# Patient Record
Sex: Female | Born: 1955 | Race: Black or African American | Hispanic: No | Marital: Single | Smoking: Former smoker
Health system: Southern US, Community
[De-identification: ages and names within clinical notes are randomized; demographics above are authoritative.]

## PROBLEM LIST (undated history)

## (undated) DIAGNOSIS — D649 Anemia, unspecified: Secondary | ICD-10-CM

## (undated) DIAGNOSIS — Z720 Tobacco use: Secondary | ICD-10-CM

## (undated) DIAGNOSIS — Z780 Asymptomatic menopausal state: Secondary | ICD-10-CM

## (undated) DIAGNOSIS — K429 Umbilical hernia without obstruction or gangrene: Secondary | ICD-10-CM

## (undated) DIAGNOSIS — R8290 Unspecified abnormal findings in urine: Secondary | ICD-10-CM

## (undated) DIAGNOSIS — M109 Gout, unspecified: Secondary | ICD-10-CM

## (undated) DIAGNOSIS — I10 Essential (primary) hypertension: Secondary | ICD-10-CM

## (undated) DIAGNOSIS — N750 Cyst of Bartholin's gland: Secondary | ICD-10-CM

## (undated) DIAGNOSIS — K061 Gingival enlargement: Secondary | ICD-10-CM

## (undated) DIAGNOSIS — D179 Benign lipomatous neoplasm, unspecified: Secondary | ICD-10-CM

## (undated) DIAGNOSIS — F419 Anxiety disorder, unspecified: Secondary | ICD-10-CM

## (undated) DIAGNOSIS — L03031 Cellulitis of right toe: Secondary | ICD-10-CM

## (undated) HISTORY — DX: Essential (primary) hypertension: I10

## (undated) HISTORY — DX: Asymptomatic menopausal state: Z78.0

## (undated) HISTORY — DX: Tobacco use: Z72.0

## (undated) HISTORY — DX: Anxiety disorder, unspecified: F41.9

## (undated) HISTORY — PX: APPENDECTOMY: SHX54

## (undated) HISTORY — DX: Gingival enlargement: K06.1

## (undated) HISTORY — DX: Cyst of Bartholin's gland: N75.0

## (undated) HISTORY — DX: Gout, unspecified: M10.9

## (undated) HISTORY — DX: Anemia, unspecified: D64.9

## (undated) HISTORY — PX: UMBILICAL HERNIA REPAIR: SHX196

## (undated) HISTORY — DX: Umbilical hernia without obstruction or gangrene: K42.9

---
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
## 1996-03-27 HISTORY — PX: TOTAL ABDOMINAL HYSTERECTOMY: SHX209

## 1998-01-23 ENCOUNTER — Other Ambulatory Visit: Admission: RE | Admit: 1998-01-23 | Discharge: 1998-01-23 | Payer: Self-pay | Admitting: Unknown Physician Specialty

## 1998-02-18 ENCOUNTER — Encounter (HOSPITAL_COMMUNITY): Admission: RE | Admit: 1998-02-18 | Discharge: 1998-02-19 | Payer: Self-pay | Admitting: Oncology

## 1998-02-25 ENCOUNTER — Encounter (HOSPITAL_COMMUNITY): Admission: RE | Admit: 1998-02-25 | Discharge: 1998-05-26 | Payer: Self-pay | Admitting: Oncology

## 1998-04-22 ENCOUNTER — Inpatient Hospital Stay (HOSPITAL_COMMUNITY): Admission: RE | Admit: 1998-04-22 | Discharge: 1998-04-26 | Payer: Self-pay | Admitting: Obstetrics & Gynecology

## 2002-01-05 ENCOUNTER — Encounter: Admission: RE | Admit: 2002-01-05 | Discharge: 2002-01-05 | Payer: Self-pay | Admitting: Obstetrics and Gynecology

## 2002-03-21 ENCOUNTER — Encounter: Admission: RE | Admit: 2002-03-21 | Discharge: 2002-03-21 | Payer: Self-pay | Admitting: *Deleted

## 2002-04-27 ENCOUNTER — Encounter: Admission: RE | Admit: 2002-04-27 | Discharge: 2002-04-27 | Payer: Self-pay | Admitting: Obstetrics and Gynecology

## 2002-04-27 ENCOUNTER — Encounter: Admission: RE | Admit: 2002-04-27 | Discharge: 2002-04-27 | Payer: Self-pay | Admitting: Internal Medicine

## 2002-05-04 ENCOUNTER — Encounter: Admission: RE | Admit: 2002-05-04 | Discharge: 2002-05-04 | Payer: Self-pay | Admitting: Internal Medicine

## 2002-06-06 ENCOUNTER — Encounter: Admission: RE | Admit: 2002-06-06 | Discharge: 2002-06-06 | Payer: Self-pay | Admitting: Internal Medicine

## 2002-08-17 ENCOUNTER — Encounter: Admission: RE | Admit: 2002-08-17 | Discharge: 2002-08-17 | Payer: Self-pay | Admitting: Internal Medicine

## 2002-10-02 ENCOUNTER — Encounter: Admission: RE | Admit: 2002-10-02 | Discharge: 2002-10-02 | Payer: Self-pay | Admitting: Internal Medicine

## 2002-12-08 ENCOUNTER — Encounter: Admission: RE | Admit: 2002-12-08 | Discharge: 2002-12-08 | Payer: Self-pay | Admitting: Internal Medicine

## 2003-02-12 ENCOUNTER — Encounter: Admission: RE | Admit: 2003-02-12 | Discharge: 2003-02-12 | Payer: Self-pay | Admitting: Internal Medicine

## 2003-03-16 ENCOUNTER — Encounter: Admission: RE | Admit: 2003-03-16 | Discharge: 2003-03-16 | Payer: Self-pay | Admitting: Internal Medicine

## 2003-06-27 DIAGNOSIS — N750 Cyst of Bartholin's gland: Secondary | ICD-10-CM

## 2003-06-27 HISTORY — DX: Cyst of Bartholin's gland: N75.0

## 2003-07-06 ENCOUNTER — Encounter: Admission: RE | Admit: 2003-07-06 | Discharge: 2003-07-06 | Payer: Self-pay | Admitting: Internal Medicine

## 2003-07-25 ENCOUNTER — Encounter: Admission: RE | Admit: 2003-07-25 | Discharge: 2003-07-25 | Payer: Self-pay | Admitting: Internal Medicine

## 2003-08-16 ENCOUNTER — Encounter: Admission: RE | Admit: 2003-08-16 | Discharge: 2003-08-16 | Payer: Self-pay | Admitting: Obstetrics and Gynecology

## 2003-11-14 ENCOUNTER — Encounter: Admission: RE | Admit: 2003-11-14 | Discharge: 2003-11-14 | Payer: Self-pay | Admitting: Internal Medicine

## 2003-11-29 ENCOUNTER — Encounter: Admission: RE | Admit: 2003-11-29 | Discharge: 2003-11-29 | Payer: Self-pay | Admitting: Internal Medicine

## 2003-12-06 ENCOUNTER — Encounter: Admission: RE | Admit: 2003-12-06 | Discharge: 2003-12-06 | Payer: Self-pay | Admitting: Internal Medicine

## 2004-11-06 ENCOUNTER — Emergency Department (HOSPITAL_COMMUNITY): Admission: EM | Admit: 2004-11-06 | Discharge: 2004-11-06 | Payer: Self-pay | Admitting: Family Medicine

## 2004-11-12 ENCOUNTER — Ambulatory Visit: Payer: Self-pay | Admitting: Internal Medicine

## 2005-02-26 ENCOUNTER — Ambulatory Visit: Payer: Self-pay | Admitting: Internal Medicine

## 2005-03-05 ENCOUNTER — Ambulatory Visit (HOSPITAL_COMMUNITY): Admission: RE | Admit: 2005-03-05 | Discharge: 2005-03-05 | Payer: Self-pay | Admitting: Internal Medicine

## 2005-03-27 ENCOUNTER — Ambulatory Visit (HOSPITAL_COMMUNITY): Admission: RE | Admit: 2005-03-27 | Discharge: 2005-03-27 | Payer: Self-pay | Admitting: Internal Medicine

## 2005-10-08 ENCOUNTER — Ambulatory Visit: Payer: Self-pay | Admitting: Internal Medicine

## 2005-12-19 ENCOUNTER — Emergency Department (HOSPITAL_COMMUNITY): Admission: EM | Admit: 2005-12-19 | Discharge: 2005-12-19 | Payer: Self-pay | Admitting: Family Medicine

## 2006-01-11 ENCOUNTER — Ambulatory Visit: Payer: Self-pay | Admitting: Internal Medicine

## 2006-01-13 ENCOUNTER — Ambulatory Visit (HOSPITAL_COMMUNITY): Admission: RE | Admit: 2006-01-13 | Discharge: 2006-01-13 | Payer: Self-pay | Admitting: Internal Medicine

## 2006-05-31 ENCOUNTER — Ambulatory Visit: Payer: Self-pay | Admitting: Internal Medicine

## 2006-06-14 DIAGNOSIS — I10 Essential (primary) hypertension: Secondary | ICD-10-CM | POA: Insufficient documentation

## 2006-06-14 DIAGNOSIS — D649 Anemia, unspecified: Secondary | ICD-10-CM

## 2006-06-14 DIAGNOSIS — N951 Menopausal and female climacteric states: Secondary | ICD-10-CM

## 2006-06-14 DIAGNOSIS — M25529 Pain in unspecified elbow: Secondary | ICD-10-CM | POA: Insufficient documentation

## 2006-06-14 HISTORY — DX: Anemia, unspecified: D64.9

## 2006-08-19 DIAGNOSIS — D179 Benign lipomatous neoplasm, unspecified: Secondary | ICD-10-CM

## 2006-08-19 DIAGNOSIS — K429 Umbilical hernia without obstruction or gangrene: Secondary | ICD-10-CM | POA: Insufficient documentation

## 2006-08-19 DIAGNOSIS — Z87891 Personal history of nicotine dependence: Secondary | ICD-10-CM | POA: Insufficient documentation

## 2006-08-19 DIAGNOSIS — F411 Generalized anxiety disorder: Secondary | ICD-10-CM | POA: Insufficient documentation

## 2006-08-19 HISTORY — DX: Benign lipomatous neoplasm, unspecified: D17.9

## 2006-10-08 ENCOUNTER — Ambulatory Visit: Payer: Self-pay | Admitting: Internal Medicine

## 2006-10-11 ENCOUNTER — Encounter: Payer: Self-pay | Admitting: Licensed Clinical Social Worker

## 2007-02-10 ENCOUNTER — Ambulatory Visit: Payer: Self-pay | Admitting: Internal Medicine

## 2007-02-10 ENCOUNTER — Encounter (INDEPENDENT_AMBULATORY_CARE_PROVIDER_SITE_OTHER): Payer: Self-pay | Admitting: Internal Medicine

## 2007-02-10 LAB — CONVERTED CEMR LAB
Albumin: 4.6 g/dL (ref 3.5–5.2)
CO2: 22 meq/L (ref 19–32)
Glucose, Bld: 88 mg/dL (ref 70–99)
Potassium: 3.8 meq/L (ref 3.5–5.3)
Sodium: 139 meq/L (ref 135–145)
Total Protein: 8 g/dL (ref 6.0–8.3)

## 2007-03-03 ENCOUNTER — Ambulatory Visit (HOSPITAL_COMMUNITY): Admission: RE | Admit: 2007-03-03 | Discharge: 2007-03-03 | Payer: Self-pay | Admitting: Internal Medicine

## 2007-03-03 ENCOUNTER — Ambulatory Visit: Payer: Self-pay | Admitting: Internal Medicine

## 2007-03-17 ENCOUNTER — Encounter (INDEPENDENT_AMBULATORY_CARE_PROVIDER_SITE_OTHER): Payer: Self-pay | Admitting: Internal Medicine

## 2007-03-17 ENCOUNTER — Ambulatory Visit: Payer: Self-pay | Admitting: *Deleted

## 2007-03-23 LAB — CONVERTED CEMR LAB
Rhuematoid fact SerPl-aCnc: <20 intl units/mL (ref 0–20)
Sed Rate: 41 mm/hr — ABNORMAL HIGH (ref 0–22)

## 2007-04-18 ENCOUNTER — Ambulatory Visit (HOSPITAL_COMMUNITY): Admission: RE | Admit: 2007-04-18 | Discharge: 2007-04-18 | Payer: Self-pay | Admitting: *Deleted

## 2007-09-30 ENCOUNTER — Telehealth: Payer: Self-pay | Admitting: *Deleted

## 2007-10-12 ENCOUNTER — Ambulatory Visit: Payer: Self-pay | Admitting: Internal Medicine

## 2007-12-13 ENCOUNTER — Encounter (INDEPENDENT_AMBULATORY_CARE_PROVIDER_SITE_OTHER): Payer: Self-pay | Admitting: Internal Medicine

## 2007-12-22 ENCOUNTER — Ambulatory Visit: Payer: Self-pay | Admitting: Internal Medicine

## 2007-12-22 ENCOUNTER — Encounter (INDEPENDENT_AMBULATORY_CARE_PROVIDER_SITE_OTHER): Payer: Self-pay | Admitting: Internal Medicine

## 2007-12-23 LAB — CONVERTED CEMR LAB
BUN: 16 mg/dL (ref 6–23)
CO2: 20 meq/L (ref 19–32)
Calcium: 9.3 mg/dL (ref 8.4–10.5)
Chloride: 108 meq/L (ref 96–112)
Creatinine, Ser: 0.63 mg/dL (ref 0.40–1.20)
Glucose, Bld: 92 mg/dL (ref 70–99)

## 2008-05-22 ENCOUNTER — Ambulatory Visit (HOSPITAL_COMMUNITY): Admission: RE | Admit: 2008-05-22 | Discharge: 2008-05-22 | Payer: Self-pay | Admitting: Internal Medicine

## 2008-08-03 ENCOUNTER — Ambulatory Visit: Payer: Self-pay | Admitting: Internal Medicine

## 2008-12-10 ENCOUNTER — Telehealth (INDEPENDENT_AMBULATORY_CARE_PROVIDER_SITE_OTHER): Payer: Self-pay | Admitting: Internal Medicine

## 2009-04-23 ENCOUNTER — Ambulatory Visit: Payer: Self-pay | Admitting: Internal Medicine

## 2009-04-24 ENCOUNTER — Encounter (INDEPENDENT_AMBULATORY_CARE_PROVIDER_SITE_OTHER): Payer: Self-pay | Admitting: Internal Medicine

## 2009-04-24 LAB — CONVERTED CEMR LAB
BUN: 16 mg/dL (ref 6–23)
CO2: 20 meq/L (ref 19–32)
Chloride: 106 meq/L (ref 96–112)
Creatinine, Ser: 0.66 mg/dL (ref 0.40–1.20)
HCT: 34.7 % — ABNORMAL LOW (ref 36.0–46.0)
Hemoglobin: 10.8 g/dL — ABNORMAL LOW (ref 12.0–15.0)
Platelets: 173 10*3/uL (ref 150–400)
WBC: 6.6 10*3/uL (ref 4.0–10.5)

## 2009-04-30 ENCOUNTER — Encounter: Payer: Self-pay | Admitting: Gastroenterology

## 2009-05-07 ENCOUNTER — Ambulatory Visit: Payer: Self-pay | Admitting: Internal Medicine

## 2009-05-08 ENCOUNTER — Encounter (INDEPENDENT_AMBULATORY_CARE_PROVIDER_SITE_OTHER): Payer: Self-pay | Admitting: Internal Medicine

## 2009-05-09 LAB — CONVERTED CEMR LAB
Cholesterol: 219 mg/dL — ABNORMAL HIGH (ref 0–200)
RBC Folate: 609 ng/mL — ABNORMAL HIGH (ref 180–600)
Triglycerides: 385 mg/dL — ABNORMAL HIGH (ref <150)
UIBC: 274 ug/dL
VLDL: 77 mg/dL — ABNORMAL HIGH (ref 0–40)

## 2009-05-14 ENCOUNTER — Ambulatory Visit: Payer: Self-pay | Admitting: Gastroenterology

## 2009-05-14 ENCOUNTER — Emergency Department (HOSPITAL_COMMUNITY): Admission: EM | Admit: 2009-05-14 | Discharge: 2009-05-14 | Payer: Self-pay | Admitting: Emergency Medicine

## 2009-05-23 ENCOUNTER — Ambulatory Visit (HOSPITAL_COMMUNITY): Admission: RE | Admit: 2009-05-23 | Discharge: 2009-05-23 | Payer: Self-pay | Admitting: Internal Medicine

## 2009-05-28 ENCOUNTER — Ambulatory Visit: Payer: Self-pay | Admitting: Gastroenterology

## 2009-06-13 ENCOUNTER — Emergency Department (HOSPITAL_COMMUNITY): Admission: EM | Admit: 2009-06-13 | Discharge: 2009-06-13 | Payer: Self-pay | Admitting: Emergency Medicine

## 2009-07-25 ENCOUNTER — Ambulatory Visit: Payer: Self-pay | Admitting: Internal Medicine

## 2009-07-26 ENCOUNTER — Encounter (INDEPENDENT_AMBULATORY_CARE_PROVIDER_SITE_OTHER): Payer: Self-pay | Admitting: Internal Medicine

## 2009-07-26 LAB — CONVERTED CEMR LAB
CO2: 21 meq/L (ref 19–32)
Chloride: 104 meq/L (ref 96–112)
Creatinine, Ser: 0.67 mg/dL (ref 0.40–1.20)
Potassium: 4.1 meq/L (ref 3.5–5.3)
Sodium: 140 meq/L (ref 135–145)

## 2009-08-29 ENCOUNTER — Telehealth (INDEPENDENT_AMBULATORY_CARE_PROVIDER_SITE_OTHER): Payer: Self-pay | Admitting: Internal Medicine

## 2009-10-02 ENCOUNTER — Telehealth (INDEPENDENT_AMBULATORY_CARE_PROVIDER_SITE_OTHER): Payer: Self-pay | Admitting: Internal Medicine

## 2009-12-06 ENCOUNTER — Ambulatory Visit: Payer: Self-pay | Admitting: Infectious Disease

## 2009-12-08 LAB — CONVERTED CEMR LAB
Alkaline Phosphatase: 53 units/L (ref 39–117)
BUN: 14 mg/dL (ref 6–23)
Creatinine, Ser: 0.72 mg/dL (ref 0.40–1.20)
Glucose, Bld: 83 mg/dL (ref 70–99)
HCT: 34.1 % — ABNORMAL LOW (ref 36.0–46.0)
Hemoglobin: 11 g/dL — ABNORMAL LOW (ref 12.0–15.0)
MCHC: 32.3 g/dL (ref 30.0–36.0)
MCV: 87 fL (ref >=78.0)
RBC: 3.92 M/uL (ref 3.87–5.11)
Total Bilirubin: 0.3 mg/dL (ref 0.3–1.2)

## 2010-02-13 ENCOUNTER — Telehealth (INDEPENDENT_AMBULATORY_CARE_PROVIDER_SITE_OTHER): Payer: Self-pay | Admitting: *Deleted

## 2010-04-11 ENCOUNTER — Ambulatory Visit: Payer: Self-pay | Admitting: Internal Medicine

## 2010-04-11 DIAGNOSIS — J069 Acute upper respiratory infection, unspecified: Secondary | ICD-10-CM | POA: Insufficient documentation

## 2010-04-12 ENCOUNTER — Encounter: Payer: Self-pay | Admitting: Internal Medicine

## 2010-04-15 LAB — CONVERTED CEMR LAB
BUN: 14 mg/dL (ref 6–23)
Basophils Relative: 0 % (ref 0–1)
Chloride: 109 meq/L (ref 96–112)
Ferritin: 197 ng/mL (ref 10–291)
Glucose, Bld: 91 mg/dL (ref 70–99)
Iron: 53 ug/dL (ref 42–145)
Lymphs Abs: 2.6 10*3/uL (ref 0.7–4.0)
Monocytes Relative: 7 % (ref 3–12)
Neutro Abs: 3.4 10*3/uL (ref 1.7–7.7)
Neutrophils Relative %: 51 % (ref 43–77)
Platelets: 197 10*3/uL (ref 150–400)
Potassium: 3.6 meq/L (ref 3.5–5.3)
RBC: 3.73 M/uL — ABNORMAL LOW (ref 3.87–5.11)
Sodium: 142 meq/L (ref 135–145)
TIBC: 303 ug/dL (ref 250–470)
UIBC: 250 ug/dL
WBC: 6.6 10*3/uL (ref 4.0–10.5)

## 2010-06-17 ENCOUNTER — Ambulatory Visit (HOSPITAL_COMMUNITY)
Admission: RE | Admit: 2010-06-17 | Discharge: 2010-06-17 | Payer: Self-pay | Source: Home / Self Care | Attending: Internal Medicine | Admitting: Internal Medicine

## 2010-08-18 ENCOUNTER — Ambulatory Visit: Admission: RE | Admit: 2010-08-18 | Discharge: 2010-08-18 | Payer: Self-pay | Source: Home / Self Care

## 2010-08-26 NOTE — Assessment & Plan Note (Signed)
Summary: CHECKUP/SB.   Vital Signs:  Patient profile:   55 year old female Height:      66 inches Weight:      188.8 pounds BMI:     30.58 Temp:     99.2 degrees F oral Pulse rate:   68 / minute BP sitting:   132 / 76  (right arm)  Vitals Entered By: Filomena Jungling NT II (Dec 06, 2009 3:37 PM) CC: need refill on meds. Is Patient Diabetic? No Pain Assessment Patient in pain? no      Nutritional Status BMI of > 30 = obese  Does patient need assistance? Functional Status Self care Ambulation Normal   Primary Care Provider:  Carlus Pavlov MD  CC:  need refill on meds..  History of Present Illness: Jenna May is a 55 yo lady with PMH as outlined in the EMR comes today for a f/u visit.   1. HTN: She is taking her meds regularly without any problem.   2. Anxiety: Her nerves is OK.   3. Anemia: She is not on any iron and is s/p hysterectomy for fibroids.   4. Leg pain: She is not taking allopurinol regularly and she has no pain.   Preventive Screening-Counseling & Management  Alcohol-Tobacco     Alcohol drinks/day: 3     Alcohol type: bud light     Smoking Status: quit     Year Quit: cant remember  Current Medications (verified): 1)  Enalapril Maleate 20 Mg Tabs (Enalapril Maleate) .... Take 1 Tablet By Mouth Once A Day 2)  Norvasc 5 Mg Tabs (Amlodipine Besylate) .... Take 1 Pill By Mouth Daily. 3)  Flexeril 5 Mg Tabs (Cyclobenzaprine Hcl) .... Take 1 Pill By Mouth Three Times A Day.  Allergies: No Known Drug Allergies  Review of Systems      See HPI  Physical Exam  Mouth:  pharynx pink and moist.   Lungs:  normal breath sounds, no crackles, and no wheezes.   Heart:  normal rate, regular rhythm, and no murmur.   Abdomen:  soft, non-tender, normal bowel sounds, no distention, no masses, and no guarding.   Extremities:  trace left pedal edema and trace right pedal edema.   Neurologic:  alert & oriented X3.   Cervical Nodes:  no anterior cervical  adenopathy and no posterior cervical adenopathy.     Impression & Recommendations:  Problem # 1:  ANXIETY (ICD-300.00) Stable without treatment, follow.   Problem # 2:  HYPERTENSION (ICD-401.9) BP great, will cont same.  Her updated medication list for this problem includes:    Enalapril Maleate 20 Mg Tabs (Enalapril maleate) .Marland Kitchen... Take 1 tablet by mouth once a day    Norvasc 5 Mg Tabs (Amlodipine besylate) .Marland Kitchen... Take 1 pill by mouth daily.  Orders: T-Comprehensive Metabolic Panel [id number]) T-CBC No Diff ([id number])  BP today: 132/76 Prior BP: 131/80 (07/25/2009)  Labs Reviewed: K+: 4.1 (07/26/2009) Creat: : 0.67 (07/26/2009)   Chol: 219 (05/08/2009)   HDL: 39 (05/08/2009)   LDL: 103 (05/08/2009)   TG: 385 (05/08/2009)  Problem # 3:  ANEMIA, NORMOCYTIC (ICD-285.9) Will check CBC, she is s/p hysterectomy.  Orders: T-CBC No Diff ([id number])  Problem # 4:  FOOT PAIN, LEFT (ICD-729.5) She is not taking allopurinol regularly and has no painful or swelling of her foot. Plan is to stop allopurinol and follow up closely.   Complete Medication List: 1)  Enalapril Maleate 20 Mg Tabs (Enalapril maleate) .... Take 1 tablet by  mouth once a day 2)  Norvasc 5 Mg Tabs (Amlodipine besylate) .... Take 1 pill by mouth daily. 3)  Flexeril 5 Mg Tabs (Cyclobenzaprine hcl) .... Take 1 pill by mouth three times a day.  Patient Instructions: 1)  Please schedule a follow-up appointment in 3 months. 2)  Limit your Sodium (Salt) to less than 2 grams a day(slightly less than 1/2 a teaspoon) to prevent fluid retention, swelling, or worsening of symptoms. 3)  It is important that you exercise regularly at least 20 minutes 5 times a week. If you develop chest pain, have severe difficulty breathing, or feel very tired , stop exercising immediately and seek medical attention. 4)  You need to lose weight. Consider a lower calorie diet and regular exercise.  5)  Check your Blood Pressure regularly. If  it is above: you should make an appointment. Prescriptions: ALLOPURINOL 100 MG TABS (ALLOPURINOL) take 1 pill by mouth daily.  #30 x 0   Entered and Authorized by:   Jason Coop MD   Signed by:   Jason Coop MD on 12/06/2009   Method used:   Faxed to ...       [facility] Department (retail)       [address] [place], [place]  [address]       Ph: [phone]       Fax: [phone]   RxID:   [id number]  Process Orders Check Orders Results:     [facility]: ABN not required for this insurance Tests Sent for requisitioning (Dec 06, 2009 4:00 PM):     12/06/2009: [facility] -- T-Comprehensive Metabolic Panel [[id number]] (signed)     12/06/2009: [facility] -- T-CBC No Diff [[id number]] (signed)    Process Orders Check Orders Results:     [facility]: ABN not required for this insurance Tests Sent for requisitioning (Dec 06, 2009 4:00 PM):     12/06/2009: [facility] -- T-Comprehensive Metabolic Panel [[id number]] (signed)     12/06/2009: [facility] -- T-CBC No Diff [[id number]] (signed)

## 2010-08-26 NOTE — Progress Notes (Signed)
Summary: refill/gg  Phone Note Refill Request  on August 29, 2009 12:45 PM  Refills Requested: Medication #1:  ALLOPURINOL 100 MG TABS take 1 pill by mouth daily..  Method Requested: Telephone to Pharmacy Initial call taken by: Merrie Roof RN,  August 29, 2009 12:45 PM  Follow-up for Phone Call       Follow-up by: Jason Coop MD,  August 29, 2009 3:44 PM    Prescriptions: ALLOPURINOL 100 MG TABS (ALLOPURINOL) take 1 pill by mouth daily.  #30 x 1   Entered and Authorized by:   Jason Coop MD   Signed by:   Jason Coop MD on 08/29/2009   Method used:   Telephoned to ...       [facility] Department (retail)       [address] [place], [place]  [address]       Ph: [phone]       Fax: [phone]   RxID:   [id number]   Appended Document: refill/gg Rx called in to [facility]

## 2010-08-26 NOTE — Progress Notes (Signed)
Summary: refill/ hla  Phone Note Refill Request Message from:  Patient on February 13, 2010 8:43 AM  Refills Requested: Medication #1:  ENALAPRIL MALEATE 20 MG TABS Take 1 tablet by mouth once a day   Dosage confirmed as above?Dosage Confirmed   Last Refilled: 6/17  Medication #2:  NORVASC 5 MG TABS take 1 pill by mouth daily.   Dosage confirmed as above?Dosage Confirmed   Last Refilled: 6/17 last visit 5/13, last labs 5/13  Initial call taken by: Marin Roberts RN,  February 13, 2010 8:45 AM    Prescriptions: NORVASC 5 MG TABS (AMLODIPINE BESYLATE) take 1 pill by mouth daily.  #90 x 3   Entered and Authorized by:   Zoila Shutter MD   Signed by:   Zoila Shutter MD on 02/13/2010   Method used:   Electronically to        [facility] [phone]* (retail)       [address]       [place], [place]  [address]       Ph: [phone]       Fax: [phone]   RxID:   [id number] ENALAPRIL MALEATE 20 MG TABS (ENALAPRIL MALEATE) Take 1 tablet by mouth once a day  #90 x 3   Entered and Authorized by:   Zoila Shutter MD   Signed by:   Zoila Shutter MD on 02/13/2010   Method used:   Electronically to        [facility] [phone]* (retail)       [address]       [place], [place]  [address]       Ph: [phone]       Fax: [phone]   RxID:   [id number]

## 2010-08-26 NOTE — Progress Notes (Signed)
Summary: refill/gg  Phone Note Refill Request  on October 02, 2009 11:12 AM  Refills Requested: Medication #1:  NORVASC 5 MG TABS take 1 pill by mouth daily.  Method Requested: Fax to Local Pharmacy Initial call taken by: Merrie Roof RN,  October 02, 2009 11:12 AM  Follow-up for Phone Call       Follow-up by: Jason Coop MD,  October 02, 2009 11:37 AM    Prescriptions: NORVASC 5 MG TABS (AMLODIPINE BESYLATE) take 1 pill by mouth daily.  #30 x 3   Entered and Authorized by:   Jason Coop MD   Signed by:   Jason Coop MD on 10/02/2009   Method used:   Telephoned to ...       [facility] Department (retail)       [address] [place], [place]  [address]       Ph: [phone]       Fax: [phone]   RxID:   [id number]

## 2010-08-26 NOTE — Assessment & Plan Note (Signed)
Summary: EST-CK/FU/MEDS/CFB   Vital Signs:  Patient profile:   55 year old female Height:      66 inches (167.64 cm) Weight:      191.4 pounds (87 kg) BMI:     31.00 O2 Sat:      100 % on Room air Temp:     98.4 degrees F (36.89 degrees C) oral Pulse rate:   64 / minute BP sitting:   150 / 79  (left arm)  Vitals Entered By: Stanton Kidney Ditzler RN (April 11, 2010 4:04 PM)  O2 Flow:  Room air Is Patient Diabetic? No Pain Assessment Patient in pain? no      Nutritional Status BMI of > 30 = obese Nutritional Status Detail appetite good  Have you ever been in a relationship where you felt threatened, hurt or afraid?denies   Does patient need assistance? Functional Status Self care Ambulation Normal Comments Ck-up and labs. Since 04/06/10  nonproductive cough, runny nose and right side top lip is sensitive.   Primary Care Provider:  Carlus Pavlov MD   History of Present Illness: Patient presents with complaint of runny nose, nasal congestion, and cough since Sunday; the cough is productive of clear mucus in small amounts.  She reports that her symptoms have improved to some extent but are still present.  She reports that she is compliant with her medications.   Depression History:      The patient denies a depressed mood most of the day and a diminished interest in her usual daily activities.         Preventive Screening-Counseling & Management  Alcohol-Tobacco     Alcohol drinks/day: 3     Alcohol type: bud light     Smoking Status: quit     Year Quit: cant remember  Allergies (verified): No Known Drug Allergies  Review of Systems General:  Denies chills, fever, and sweats. ENT:  Denies earache and sore throat. CV:  Denies chest pain or discomfort and swelling of feet. Resp:  Complains of cough; denies shortness of breath. GI:  Denies abdominal pain, bloody stools, dark tarry stools, nausea, and vomiting. GU:  Denies abnormal vaginal bleeding.  Physical  Exam  General:  alert, no distress Ears:  R ear normal and L ear normal.   Nose:  no sinus percussion tenderness.   Mouth:  pharynx pink and moist, no erythema, and no exudates.   Neck:  supple.   Lungs:  normal respiratory effort, normal breath sounds, no crackles, and no wheezes.   Heart:  normal rate, regular rhythm, no murmur, no gallop, and no rub.   Extremities:  no edema   Impression & Recommendations:  Problem # 1:  URI (ICD-465.9) Patient's symptoms are consistent viral URI.  Plan is symptomatic treatment. I advised patient to call or return if symptoms worsen or if they do not resolve.  Her updated medication list for this problem includes:    Chest Congestion Relief Dm 20-400 Mg Tabs (Dextromethorphan-guaifenesin) .Marland Kitchen... Take 1 tablet by mouth every 4 hours as needed  Problem # 2:  HYPERTENSION (ICD-401.9) Systolic BP is mildly elevated today.  Plan is to recheck upon return.  Her updated medication list for this problem includes:    Enalapril Maleate 20 Mg Tabs (Enalapril maleate) .Marland Kitchen... Take 1 tablet by mouth once a day    Norvasc 5 Mg Tabs (Amlodipine besylate) .Marland Kitchen... Take 1 pill by mouth daily.  Orders: T-Basic Metabolic Panel ([id number])  BP today: 150/79 Prior BP: 132/76 (  12/06/2009)  Labs Reviewed: K+: 4.1 (12/06/2009) Creat: : 0.72 (12/06/2009)   Chol: 219 (05/08/2009)   HDL: 39 (05/08/2009)   LDL: 103 (05/08/2009)   TG: 385 (05/08/2009)  Problem # 3:  ANEMIA, NORMOCYTIC (ICD-285.9) Patient has chronic mild anemia.  Plan is to check labs as below.  Orders: T-CBC w/Diff [id number]) T-Ferritin [id number]) T-Iron [id number]) T-Iron Binding Capacity (TIBC) ([id number]) T-Folic Acid; RBC [id number]) T-Vitamin B12 [phone])  Hgb: 11.0 (12/06/2009)   Hct: 34.1 (12/06/2009)   Platelets: 229 (12/06/2009) RBC: 3.92 (12/06/2009)   RDW: 13.9 (12/06/2009)   WBC: 8.6 (12/06/2009) MCV: 87.0 (12/06/2009)   MCHC: 32.3 (12/06/2009) Ferritin: 236  (05/08/2009) Iron: 43 (05/08/2009)   TIBC: 317 (05/08/2009)   % Sat: 14 (05/08/2009) B12: 1086 (05/08/2009)   Folate: 609 (05/08/2009)   TSH: 3.986 (07/26/2009)  Complete Medication List: 1)  Enalapril Maleate 20 Mg Tabs (Enalapril maleate) .... Take 1 tablet by mouth once a day 2)  Norvasc 5 Mg Tabs (Amlodipine besylate) .... Take 1 pill by mouth daily. 3)  Chest Congestion Relief Dm 20-400 Mg Tabs (Dextromethorphan-guaifenesin) .... Take 1 tablet by mouth every 4 hours as needed  Patient Instructions: 1)  Please schedule a follow-up appointment in 1 month. 2)  Continue current medications. 3)  Use Afrin nasal spray every 12 hours as needed for nasal congestion for 3 days. 4)  Call or return if symptoms worsen or do not resolve.  Process Orders Check Orders Results:     [facility]: ABN not required for this insurance Tests Sent for requisitioning (April 11, 2010 6:04 PM):     04/11/2010: [facility] -- T-Basic Metabolic Panel [phone] [id number] (signed)     04/11/2010: [facility] -- T-CBC w/Diff [[id number]] (signed)     04/11/2010: [facility] -- T-Ferritin [phone] (signed)     04/11/2010: [facility] -- Augusto Gamble [[id number]] (signed)     04/11/2010: [facility] -- Augusto Gamble Binding Capacity (TIBC) [[id number]] (signed)     04/11/2010: [facility] -- T-Folic Acid; RBC [[id number]] (signed)     04/11/2010: [facility] -- T-Vitamin B12 [[id number]] (signed)     Prevention & Chronic Care Immunizations   Influenza vaccine: Not documented   Influenza vaccine deferral: Refused  (04/11/2010)    Tetanus booster: Not documented    Pneumococcal vaccine: Not documented  Colorectal Screening   Hemoccult: Not documented    Colonoscopy: DONE  (05/28/2009)   Colonoscopy due: 05/2019  Other Screening   Pap smear: Not documented   Pap smear  action/deferral: Not indicated S/P hysterectomy  (04/11/2010)    Mammogram: ASSESSMENT: Negative - BI-RADS 1^MM DIGITAL SCREENING  (05/23/2009)   Mammogram action/deferral: Ordered  (04/23/2009)   Smoking status: quit  (04/11/2010)  Lipids   Total Cholesterol: 219  (05/08/2009)   LDL: 103  (05/08/2009)   LDL Direct: Not documented   HDL: 39  (05/08/2009)   Triglycerides: 385  (05/08/2009)  Hypertension   Last Blood Pressure: 150 / 79  (04/11/2010)   Serum creatinine: 0.72  (12/06/2009)   Serum potassium 4.1  (12/06/2009)    Hypertension flowsheet reviewed?: Yes   Progress toward BP goal: Deteriorated  Self-Management Support :   Personal Goals (by the next clinic visit) :      Personal blood pressure goal: 140/90  (04/23/2009)   Patient will work on the following items until the next clinic visit to reach self-care goals:     Medications and monitoring: take my medicines every day, bring  all of my medications to every visit  (04/11/2010)     Eating: drink diet soda or water instead of juice or soda, eat more vegetables, use fresh or frozen vegetables, eat baked foods instead of fried foods, eat fruit for snacks and desserts  (04/11/2010)     Activity: take the stairs instead of the elevator, park at the far end of the parking lot  (04/11/2010)    Hypertension self-management support: Written self-care plan, Education handout, Resources for patients handout  (04/11/2010)   Hypertension self-care plan printed.   Hypertension education handout printed      Resource handout printed.

## 2010-08-28 NOTE — Assessment & Plan Note (Signed)
Summary: CHECKUP/SB.   Vital Signs:  Patient profile:   55 year old female Height:      66 inches   Primary Care Provider:  Carlus Pavlov MD   History of Present Illness: Patient left the clinic without beeing seen by any physician.   Allergies: No Known Drug Allergies   Complete Medication List: 1)  Enalapril Maleate 20 Mg Tabs (Enalapril maleate) .... Take 1 tablet by mouth once a day 2)  Norvasc 5 Mg Tabs (Amlodipine besylate) .... Take 1 pill by mouth daily. 3)  Chest Congestion Relief Dm 20-400 Mg Tabs (Dextromethorphan-guaifenesin) .... Take 1 tablet by mouth every 4 hours as needed     Prevention & Chronic Care Immunizations   Influenza vaccine: Not documented   Influenza vaccine deferral: Refused  (04/11/2010)    Tetanus booster: Not documented    Pneumococcal vaccine: Not documented  Colorectal Screening   Hemoccult: Not documented    Colonoscopy: DONE  (05/28/2009)   Colonoscopy due: 05/2019  Other Screening   Pap smear: Not documented   Pap smear action/deferral: Not indicated S/P hysterectomy  (04/11/2010)    Mammogram: ASSESSMENT: Negative - BI-RADS 1^MM DIGITAL SCREENING  (05/23/2009)   Mammogram action/deferral: Ordered  (04/23/2009)   Smoking status: quit  (04/11/2010)  Lipids   Total Cholesterol: 219  (05/08/2009)   LDL: 103  (05/08/2009)   LDL Direct: Not documented   HDL: 39  (05/08/2009)   Triglycerides: 385  (05/08/2009)  Hypertension   Last Blood Pressure: 150 / 79  (04/11/2010)   Serum creatinine: 0.71  (04/12/2010)   Serum potassium 3.6  (04/12/2010)  Self-Management Support :   Personal Goals (by the next clinic visit) :      Personal blood pressure goal: 140/90  (04/23/2009)   Hypertension self-management support: Written self-care plan, Education handout, Resources for patients handout  (04/11/2010)

## 2010-09-29 ENCOUNTER — Ambulatory Visit (INDEPENDENT_AMBULATORY_CARE_PROVIDER_SITE_OTHER): Payer: Self-pay | Admitting: Internal Medicine

## 2010-09-29 ENCOUNTER — Encounter: Payer: Self-pay | Admitting: Internal Medicine

## 2010-09-29 ENCOUNTER — Ambulatory Visit: Payer: Self-pay | Admitting: Internal Medicine

## 2010-09-29 VITALS — BP 151/83 | HR 77 | Temp 98.1°F | Ht 66.0 in | Wt 190.0 lb

## 2010-09-29 DIAGNOSIS — I10 Essential (primary) hypertension: Secondary | ICD-10-CM

## 2010-09-29 DIAGNOSIS — Z87891 Personal history of nicotine dependence: Secondary | ICD-10-CM

## 2010-09-29 DIAGNOSIS — M25569 Pain in unspecified knee: Secondary | ICD-10-CM

## 2010-09-29 DIAGNOSIS — D179 Benign lipomatous neoplasm, unspecified: Secondary | ICD-10-CM

## 2010-09-29 DIAGNOSIS — N951 Menopausal and female climacteric states: Secondary | ICD-10-CM

## 2010-09-29 DIAGNOSIS — D649 Anemia, unspecified: Secondary | ICD-10-CM

## 2010-09-29 DIAGNOSIS — F411 Generalized anxiety disorder: Secondary | ICD-10-CM

## 2010-09-29 DIAGNOSIS — M25562 Pain in left knee: Secondary | ICD-10-CM | POA: Insufficient documentation

## 2010-09-29 LAB — RETICULOCYTES
ABS Retic: 57.6 10*3/uL (ref 19.0–186.0)
Retic Ct Pct: 1.5 % (ref 0.4–3.1)

## 2010-09-29 MED ORDER — HYDROCHLOROTHIAZIDE 12.5 MG PO TABS: 12.5000 mg | ORAL_TABLET | Freq: Every day | ORAL | Status: DC

## 2010-09-29 NOTE — Patient Instructions (Signed)
1) Please follow-up at the clinic in 3-4 weeks, at which time we will reevaluate ur blood pressure, pain, check labs. 2) STOP taking your Amlodipine 3) You have been started on Hydrochlorothiazide for your blood pressure, if you develop rash, throat closing, tongue swelling, please stop the medication and call the clinic at [phone]. 4) Please come fasting (no food/ drink for 8 hours) prior to your next visit so we can check your cholesterol panel, and kidney function.  5) You can elevate your leg, use ice or heat pad over your knee for swelling 6) Please bring all of your medications in a bag to your next visit.

## 2010-09-29 NOTE — Assessment & Plan Note (Signed)
Likely inflammatory process such as arthritis. No ligamentous laxity, no limitation in mobility at this time.  -  Continue over-the-counter naproxen PRN pain -  No indication for imaging at this time.  -  Pt instructed to return to clinic if pain becomes chronic, present significant limitation in mobility, or if swelling worsens.

## 2010-09-29 NOTE — Assessment & Plan Note (Addendum)
Unclear etiology. Anemia panel in 03/2010 showing Iron 53 , TIBC 303 , iron saturation percentage 17, ferritin 197 , B12 and folate within normal limits.  Patient has had a negative colonoscopy and mammogram within the past 2 years.  No identifiable medication effect to account for her anemia. No history of renal or splenic disease.  Normal TSH in 2011. No history of alcohol abuse.  Patient at this time asymptomatic without weakness lightheadedness fatigue dizziness. -  Will check CBC to assess stability of hemoglobin. -  Will check reticulocyte count.

## 2010-09-29 NOTE — Progress Notes (Signed)
  Subjective:    Patient ID: Jenna May, female    DOB: 1955-07-30, 55 y.o.   MRN: [id number]  HPI  Patient is a 55 year old female with a past medical history of hypertension normocytic anemia remote history of tobacco abuse who presents to clinic today for the following :  1) Left knee pain - 3 days, feels swelling, warmth, and throbbing pain all over knee. Takes aleve 2 tabs daily, which seems to resolve pain. 5/10 pain at max, helps with swelling as well. No hx of trauma or falls. No recent increase in activity.   2) Follow-up of labs - anemia panel showing chronic normocytic anemia, without evidence of iron-deficiency, folate, or B12 deficiency. No weakness, lightheadness, dizziness. Patient has had normal colonoscopy,   3) HTN - Patient  does not check blood pressure regularly at home, with average BP readings of 130/80 mmHg when she occasionally going to [employer]. Currently taking Enalapril and Norvasc (only intermittently taking Norvasc because was not able to get it as generic at [facility]). Denies headaches, dizziness, lightheadedness, chest pain, shortness of breath.     Review of Systems Per HPI    Objective:   Physical Exam General: Vital signs reviewed and noted. Well-developed,well-nourished,in no acute distress; alert,appropriate and cooperative throughout examination. Head: normocephalic, atraumatic. Neck: No deformities, masses, or tenderness noted. Lungs: Normal respiratory effort. Clear to auscultation BL without crackles or wheezes.  Heart: RRR. S1 and S2 normal without gallop, murmur, or rubs.  Abdomen: BS normoactive. Soft, Nondistended, non-tender.  No masses or organomegaly. Extremities: No pretibial edema. FROM of BL LE. Left knee with mild warmth, very mild swelling, no appreciable redness, effusion. No evidence of ligamentous laxity. Sensation equal bilaterally. Pulse equal and strong bilaterally.         Assessment & Plan:

## 2010-09-29 NOTE — Assessment & Plan Note (Addendum)
Blood pressures mildly elevated. However, patient  not regularly taking her amlodipine secondary to financial constraints, as it is not covered on the [employer] $4 formulary.  Patient was previously discontinued of her hydrochlorothiazide when it was thought that she may have gout , however she was previously well controlled on this medication and joint aspiration was not indicative of gout. -  Discontinue amlodipine.  -  Start hydrochlorothiazide 12.5 mg once daily.  -  Check basic metabolic panel and fasting lipid panel in 3-4 weeks.

## 2010-09-30 LAB — CBC
Hemoglobin: 10.8 g/dL — ABNORMAL LOW (ref 12.0–15.0)
MCH: 28.4 pg (ref 26.0–34.0)
Platelets: 198 10*3/uL (ref 150–400)
RBC: 3.8 MIL/uL — ABNORMAL LOW (ref 3.87–5.11)
WBC: 6.5 10*3/uL (ref 4.0–10.5)

## 2010-10-29 LAB — URINALYSIS, ROUTINE W REFLEX MICROSCOPIC
Glucose, UA: NEGATIVE mg/dL
Ketones, ur: NEGATIVE mg/dL
Leukocytes, UA: NEGATIVE
Nitrite: NEGATIVE
Protein, ur: NEGATIVE mg/dL
Urobilinogen, UA: 0.2 mg/dL (ref 0.0–1.0)

## 2010-10-29 LAB — URINE MICROSCOPIC-ADD ON

## 2010-11-06 ENCOUNTER — Encounter: Payer: Self-pay | Admitting: Internal Medicine

## 2010-11-06 ENCOUNTER — Ambulatory Visit (INDEPENDENT_AMBULATORY_CARE_PROVIDER_SITE_OTHER): Payer: Self-pay | Admitting: Internal Medicine

## 2010-11-06 DIAGNOSIS — I10 Essential (primary) hypertension: Secondary | ICD-10-CM

## 2010-11-06 DIAGNOSIS — Z23 Encounter for immunization: Secondary | ICD-10-CM

## 2010-11-06 DIAGNOSIS — L600 Ingrowing nail: Secondary | ICD-10-CM

## 2010-11-06 DIAGNOSIS — Z0000 Encounter for general adult medical examination without abnormal findings: Secondary | ICD-10-CM

## 2010-11-06 LAB — LIPID PANEL
LDL Cholesterol: 222 mg/dL — ABNORMAL HIGH (ref 0–99)
Total CHOL/HDL Ratio: 7.4 Ratio
VLDL: 8 mg/dL (ref 0–40)

## 2010-11-06 LAB — COMPREHENSIVE METABOLIC PANEL
ALT: 33 U/L (ref 0–35)
AST: 17 U/L (ref 0–37)
Albumin: 4.6 g/dL (ref 3.5–5.2)
Alkaline Phosphatase: 53 U/L (ref 39–117)
BUN: 10 mg/dL (ref 6–23)
Calcium: 10.1 mg/dL (ref 8.4–10.5)
Chloride: 102 mEq/L (ref 96–112)
Potassium: 3.7 mEq/L (ref 3.5–5.3)
Sodium: 139 mEq/L (ref 135–145)

## 2010-11-06 MED ORDER — LISINOPRIL-HYDROCHLOROTHIAZIDE 20-25 MG PO TABS: 1.0000 | ORAL_TABLET | Freq: Every day | ORAL | Status: DC

## 2010-11-06 NOTE — Progress Notes (Signed)
  Subjective:    Patient ID: Jenna May, female    DOB: [birth year], 55 y.o.   MRN: [id number]  HPI  Patient is a 55 year old female with a past medical history of hypertension and remote history of tobacco abuse who presents to clinic today for the following:  1) HTN followup - Patient does not check blood pressure regularly at home. Currently taking Enalapril 20mg  and HCTZ 12.5mg  daily (which was just started last month). States taking regularly without missing doses. Denies headaches, dizziness, lightheadedness, chest pain, shortness of breath. Urinating more frequently, but not disruptive to daily life. Requests refills.  2) Rt great toe in grown toe-nail - pt removed an ingrown toenail with nail clipper, approximately 3 weeks ago, after which time she noted surrounding tenderness, swelling, erythema, warmth, and small amount of unsure if she cut her skin, noted pain, swelling, mild pus. Affected area, has been getting better over last 1 week.   Review of Systems Per HPI.  Current Outpatient Medications Medication Sig  . Dextromethorphan-Guaifenesin 20-400 MG TABS Take 1 tablet by mouth every 4 (four) hours as needed.    . enalapril (VASOTEC) 20 MG tablet Take 20 mg by mouth daily.    . hydrochlorothiazide (HYDRODIURIL) 12.5 MG tablet Take 1 tablet (12.5 mg total) by mouth daily.    Allergies Review of patient's allergies indicates no known allergies.  Past Medical History  Diagnosis Date  . Hypertension   . Tobacco abuse     Quit 2009, after approximately 15 pack year smoking history.  . Anxiety   . 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 Umbilical hernia     S/P repair approximately 1995  . Postmenopausal status   . Bartholin cyst 06/2003  . Normocytic anemia     Unclear etiology. Anemia panel in 03/2010 showing Iron 53 , TIBC 303 , iron saturation percentage 17, ferritin 197 , B12 and folate within normal limits.    Past Surgical  History  Procedure Date  . Umbilical hernia repair approximately 1995  . Appendectomy approximately 1991  . Total abdominal hysterectomy 03/1996    2/2 fibroids       Objective:   Physical Exam General: Vital signs reviewed and noted. Well-developed, well-nourished, in no acute distress; alert, appropriate and cooperative throughout examination.  Head: Normocephalic, atraumatic.  Neck: No deformities, masses, or tenderness noted.  Lungs:  Normal respiratory effort. Clear to auscultation BL without crackles or wheezes.  Heart: RRR. S1 and S2 normal without gallop, murmur, or rubs.  Abdomen:  BS normoactive. Soft, Nondistended, non-tender.  No masses or organomegaly.  Extremities: No pretibial edema. Hyperpigmentation of skin surrounding medial aspect of great toenail, without associated warmth, erythema, exudates. Nontender to palpation.      Assessment & Plan:  Case and plan of care discussed with Dr. Doneen Poisson.

## 2010-11-06 NOTE — Patient Instructions (Signed)
1) Please follow-up at the clinic in 1 month, at which time we will reevaluate your blood pressure and ingrown toenail. 2) You have been started on new medication(s), if you develop throat closing, tongue swelling, rash, please stop the medication and call the clinic at [phone] and go to the ER. 3) Please bring all of your medications in a bag to your next visit. 4) If you are diabetic, please bring your meter to your next visit. 5) If symptoms worsen, or new symptoms arise, please call the clinic or go to the ER. 6) Read instructions below. Ingrown Toenail An ingrown toenail occurs when the sharp edge of your toenail grows into the skin. Causes of ingrown toenails include toenails clipped too far back or poorly fitting shoes. Activities involving sudden stops (basketball, tennis) causing "toe jamming" may lead to an ingrown nail. HOME CARE INSTRUCTIONS  Soak the whole foot in warm soapy water for 20 minutes, 3 times per day.   You may lift the edge of the nail away from the sore skin by wedging a small piece of cotton under the corner of the nail. Be careful not to dig (traumatize) and cause more injury to the area.   Wear shoes that fit well. While the ingrown nail is causing problems, sandals may be beneficial.   Trim your toenails regularly and carefully. Cut your toenails straight across, not in a curve. This will prevent injury to the skin at the corners of the toenail.   Keep your feet clean and dry.   Crutches may be helpful early in treatment if walking is painful.   Only take over-the-counter or prescription medicines for pain, discomfort, or fever as directed by your caregiver.  SEEK IMMEDIATE MEDICAL CARE IF:  An oral temperature above 101 F develops.   You have increasing pain, redness, swelling or heat at the wound site.   The toe is not better in 7 days.  If conservative treatment is not successful, surgical removal of a portion or all of the nail may be necessary. MAKE  SURE YOU:    Understand these instructions.   Will watch your condition.   Will get help right away if you are not doing well or get worse.  Document Released: 07/10/2000 Document Re-Released: 10/09/2008 [facility] Patient Information 2011 [place], [place].

## 2010-11-07 DIAGNOSIS — L600 Ingrowing nail: Secondary | ICD-10-CM | POA: Insufficient documentation

## 2010-11-07 DIAGNOSIS — Z0000 Encounter for general adult medical examination without abnormal findings: Secondary | ICD-10-CM

## 2010-11-07 HISTORY — DX: Encounter for general adult medical examination without abnormal findings: Z00.00

## 2010-11-07 NOTE — Assessment & Plan Note (Signed)
Hypertension continues to be uncontrolled. Will escalate his HCTZ therapy, and change to combo pill, which may be both more effective and less effective for the pt. - As pt just refilled her Enalapril and HCTZ, she is instructed to double her HCTZ dosage until she runs out. Then she is to stop her Enalapril, and start the combo pill. - Will monitor BMET to ensure not becoming hypokalemic in setting of HCTZ.

## 2010-11-07 NOTE — Assessment & Plan Note (Signed)
Healing. Consistent with mild to moderate lesion. If ingrown toenails recur, have recommended the following: Will recommend conservative management at this time, such as placing a cotton wedging or dental floss underneath the lateral nail plate to separate the nail plate from the lateral nail fold, thereby relieving pressure.  Soak the affected foot in warm, soapy water for 10 to 20 minutes, three times per day for one to two weeks, pushing the lateral nailfold away from the nail plate  If ingrown toenails occur despite conservative therapy, will consider podiatry referral. However, at this time current lesion is healing, and patient is without insurance at this time, so will not jump to referral as will be out-of-pocket expense.  Return to clinic if she develops fevers, chills, significant worsening of pain, redness, discharge.

## 2010-11-07 NOTE — Assessment & Plan Note (Signed)
Tdap today. FLP today.

## 2010-12-12 NOTE — Consult Note (Signed)
[facility]  Patient:    Jenna May, Jenna May Visit Number: [id number] MRN: [id number]          Service Type: Attending:  Kristen Loader, M.D. Dictated by:   Kristen Loader, M.D. Proc. Date: 01/05/02                            Consultation Report  HISTORY OF PRESENT ILLNESS:  The patient is a 55 year old African-American who had a history of abdominal hysterectomy for fibroids in 1999.  She had a Pap smear in 2000 and none since that time.  She came in desiring a Pap smear but also was bothered by some dyspareunia secondary she thought to irritation from a discharge in the vagina.  PHYSICAL EXAMINATION:  ABDOMEN:  On physical examination, abdomen was soft, flat, and nontender.  No masses.  No organomegaly.  PELVIC:  Vaginal examination showed external genitalia was normal.  The introitus is narrow.  The BUS was within normal limits.  The vagina was clean and well rugated and the cervix was parous.  Bimanual pelvic examination failed to be able to palpate the adnexa.  The apex of the vagina was status hysterectomy.  LABORATORY DATA:  The DNA for gonorrhea and chlamydia were taken.  Wet prep was taken and KLH prep done.  The wet prep revealed 4+ Trichomonas with clue cells and a positive whiff.  The pH was 6.  It was negative for yeast.  IMPRESSION:  Bacterial vaginosis and Trichomonas vaginitis.  DISPOSITION:  The patient was given a prescription for Flagyl 250 mg #21 with 1 t.i.d. with food for 7 days and MetroGel-Vaginal HS for 7 days. Dictated by:   Kristen Loader, M.D. Attending:  Kristen Loader, M.D. DD:  01/05/02 TD:  01/07/02 Job: [id number]

## 2010-12-15 ENCOUNTER — Encounter: Payer: Self-pay | Admitting: Internal Medicine

## 2011-01-29 ENCOUNTER — Ambulatory Visit (INDEPENDENT_AMBULATORY_CARE_PROVIDER_SITE_OTHER): Payer: Self-pay | Admitting: Internal Medicine

## 2011-01-29 ENCOUNTER — Encounter: Payer: Self-pay | Admitting: Internal Medicine

## 2011-01-29 ENCOUNTER — Ambulatory Visit (HOSPITAL_COMMUNITY)
Admission: RE | Admit: 2011-01-29 | Discharge: 2011-01-29 | Disposition: A | Discharge status: Home / Self Care | Payer: Self-pay | Source: Ambulatory Visit | Attending: Internal Medicine | Admitting: Internal Medicine

## 2011-01-29 VITALS — BP 146/83 | HR 55 | Temp 97.4°F | Ht 66.0 in | Wt 186.9 lb

## 2011-01-29 DIAGNOSIS — R05 Cough: Secondary | ICD-10-CM

## 2011-01-29 DIAGNOSIS — I10 Essential (primary) hypertension: Secondary | ICD-10-CM | POA: Insufficient documentation

## 2011-01-29 DIAGNOSIS — R059 Cough, unspecified: Secondary | ICD-10-CM | POA: Insufficient documentation

## 2011-01-29 DIAGNOSIS — E785 Hyperlipidemia, unspecified: Secondary | ICD-10-CM

## 2011-01-29 DIAGNOSIS — M479 Spondylosis, unspecified: Secondary | ICD-10-CM | POA: Insufficient documentation

## 2011-01-29 MED ORDER — HYDROCHLOROTHIAZIDE 25 MG PO TABS: 25.0000 mg | ORAL_TABLET | Freq: Every day | ORAL | Status: DC

## 2011-01-29 MED ORDER — AMLODIPINE BESYLATE 5 MG PO TABS: 5.0000 mg | ORAL_TABLET | Freq: Every day | ORAL | Status: DC

## 2011-01-29 MED ORDER — PRAVASTATIN SODIUM 40 MG PO TABS: 40.0000 mg | ORAL_TABLET | Freq: Every evening | ORAL | Status: DC

## 2011-01-29 NOTE — Patient Instructions (Addendum)
   Please follow-up at the clinic in 4-6 weeks at which time we will reevaluate your blood pressure, liver function tests, cough.   STOP your Lisinopril-HCTZ,   Start HCTZ and Amlodipine (you can get this at [facility] your blood pressure.  Start Pravastatin for your cholesterol.  If you develop difficulty breathing, shortness of breath, tongue swelling with any of these new medications, go to the ER immediately.   You will be scheduled for a chset X-ray, please get it done as soon as possible.  Please follow the instructions for reflux that are provided below.   If symptoms worsen, or new symptoms arise, please call the clinic or go to the ER.   Please bring all of your medications in a bag to your next visit.    Acid Reflux (GERD) Acid reflux is also called gastroesophageal reflux disease (GERD). Your stomach makes acid to help digest food. Acid reflux happens when acid from your stomach goes into the tube between your mouth and stomach (esophagus). Your stomach is protected from the acid, but this tube is not. When acid gets into the tube, it may cause a burning feeling in the chest (heartburn). Besides heartburn, other health problems can happen if the acid keeps going into the tube. Some causes of acid reflux include:  Being overweight.   Smoking.   Drinking alcohol.   Eating large meals.   Eating meals and then going to bed right away.   Eating certain foods.   Increased stomach acid production.  HOME CARE  Take all medicine as told by your doctor.   You may need to:   Lose weight.   Avoid alcohol.   Quit smoking.   Do not eat big meals. It is better to eat smaller meals throughout the day.   Do not eat a meal and then nap or go to bed.   Sleep with your head higher than your stomach.   Avoid foods that bother you.   You may need more tests, or you may need to see a special doctor.  GET HELP RIGHT AWAY IF:  You have chest pain that is different than  before.   You have pain that goes to your arms, jaw, or between your shoulder blades.   You throw up (vomit) blood, dark brown liquid, or your throw up looks like coffee grounds.   You have trouble swallowing.   You have trouble breathing or cannot stop coughing.   You feel dizzy or pass out.   Your skin is cool, wet, and pale.   Your medicine is not helping.  MAKE SURE YOU:    Understand these instructions.   Will watch your condition.   Will get help right away if you are not doing well or get worse.  Document Released: 12/30/2007 Document Re-Released: 10/07/2009 [facility] Patient Information 2011 [place], [place].

## 2011-01-29 NOTE — Progress Notes (Signed)
  Subjective:    Patient ID: Jenna May, female    DOB: 1955-08-31, 55 y.o.   MRN: [id number]  HPI  Patient is a 55 year old female with a past medical history of hypertension and remote history of tobacco abuse who presents to clinic today for the following:  1) HTN followup -  Patient does not check blood pressure regularly at home. Currently taking Lisinopril 20-HCTZ 25 mg daily. States taking regularly without missing doses.Continuing to have increased urinary frequency but is not disruptive to life.  Denies headaches, dizziness, lightheadedness, chest pain, shortness of breath.   2) Cough - patient has been having chronic cough since starting Enalapril, which was then switched to Lisinopril in 10/2010 in the setting of uncontrolled HTN, with escalation of HCTZ dosage at that time. Patient did not mentionto me that she was having this coughing issue during our last visit. She notes worsening of cough symptoms since starting Lisinopril, which has been worse over the last 4 days, over which time she is also coughing up white phlegm. However, also notes that she is having intermittent burning epigastric pain, and occasional regurgitation of food when bending down to reach for something on the floor. This occurs a few times a week. Patient otherwise denies itchy watery eyes, no runny nose, no fevers, no chills. Pt is a former smoker.    Review of Systems Per HPI.  Current Outpatient Medications Medication Sig  . Dextromethorphan-Guaifenesin 20-400 MG TABS Take 1 tablet by mouth every 4 (four) hours as needed.    Marland Kitchen lisinopril-hydrochlorothiazide (PRINZIDE,ZESTORETIC) 20-25 MG per tablet Take 1 tablet by mouth daily.     Allergies Review of patient's allergies indicates no known allergies.    Past Surgical History  Procedure Date  . Umbilical hernia repair approximately 1995  . Appendectomy approximately 1991  . Total abdominal hysterectomy 03/1996    2/2 fibroids         Objective:   Physical Exam  General: Vital signs reviewed and noted. Well-developed, well-nourished, in no acute distress; alert, appropriate and cooperative throughout examination.  Head: Normocephalic, atraumatic.  Ears: TM nonerythematous, without bulging. Good light reflex.  Nose: Nonedematous, no drainage appreciated.  Throat: No exudates, erythema, or tonsillar enlargement noted.  Neck: No deformities, masses, or tenderness noted.  Lungs:  Normal respiratory effort. Clear to auscultation BL without crackles or wheezes.  Heart: RRR. S1 and S2 normal without gallop, murmur, or rubs.  Abdomen:  BS normoactive. Soft, Nondistended, non-tender.  No masses or organomegaly.  Extremities: No pretibial edema. Hyperpigmentation of skin surrounding medial aspect of great toenail, without associated warmth, erythema, exudates. Nontender to palpation.      Assessment & Plan:  Case and plan of care discussed with Dr. Margarito Liner.

## 2011-02-05 NOTE — Assessment & Plan Note (Signed)
Patient continues to have elevated blood pressure despite ACE inhibitor and Dyazide diuretic usage , as well as a chronic cough concerning for ACE inhibitor cough. - Will discontinue ACE inhibitor at this time. - As the patient is African American, she may respond well to a calcium channel blocker, therefore will start amlodipine. Discussion was had with the patient regarding increased cost of amlodipine as opposed to other antihypertensives, and however it seems that amlodipine can be purchased at a reduced cost at [facility], and can subsequently be covered through their medication assistance program. Therefore, the patient is agreeable to start amlodipine at this time, and she will call us if she has any difficulty filling her medication.

## 2011-02-05 NOTE — Assessment & Plan Note (Signed)
Patient was found out elevated cholesterol levels, and therefore will be started on anti-lipid therapy at this time. Was recommended to start aspirin however the patient prefers to defer at this time. - Start pravastatin. - Check liver function tests in 6 weeks.

## 2011-02-05 NOTE — Assessment & Plan Note (Signed)
Etiology of cough isn't clear at this time, and may actually be multifactorial, as patient describes ongoing cough since ACE inhibitor usage, however she also concurrently endorses symptoms of acid reflux with frequent regurgitation episodes. There is no specific evidence of allergic component at this time. Additionally, she is a former smoker. No specific evidence of infectious etiology at this time, as patient remains with lungs clear to auscultation bilaterally and remains without fever, chills. - Will check two-view chest x-ray in setting of chronic cough. - Will stop ACE inhibitor at this time, after discussion with patient. - Will also recommend reflux diet (patient prefers to avoid using additional medications if possible.)

## 2011-03-26 ENCOUNTER — Emergency Department (HOSPITAL_COMMUNITY)
Admission: EM | Admit: 2011-03-26 | Discharge: 2011-03-26 | Disposition: A | Discharge status: Home / Self Care | Payer: Self-pay | Attending: Emergency Medicine | Admitting: Emergency Medicine

## 2011-03-26 DIAGNOSIS — Z79899 Other long term (current) drug therapy: Secondary | ICD-10-CM | POA: Insufficient documentation

## 2011-03-26 DIAGNOSIS — R21 Rash and other nonspecific skin eruption: Secondary | ICD-10-CM | POA: Insufficient documentation

## 2011-03-26 DIAGNOSIS — I10 Essential (primary) hypertension: Secondary | ICD-10-CM | POA: Insufficient documentation

## 2011-03-26 DIAGNOSIS — L298 Other pruritus: Secondary | ICD-10-CM | POA: Insufficient documentation

## 2011-03-26 DIAGNOSIS — L2989 Other pruritus: Secondary | ICD-10-CM | POA: Insufficient documentation

## 2011-04-06 ENCOUNTER — Encounter: Payer: Self-pay | Admitting: Internal Medicine

## 2011-04-06 ENCOUNTER — Ambulatory Visit (INDEPENDENT_AMBULATORY_CARE_PROVIDER_SITE_OTHER): Payer: Self-pay | Admitting: Internal Medicine

## 2011-04-06 DIAGNOSIS — L738 Other specified follicular disorders: Secondary | ICD-10-CM

## 2011-04-06 DIAGNOSIS — I10 Essential (primary) hypertension: Secondary | ICD-10-CM

## 2011-04-06 DIAGNOSIS — L739 Follicular disorder, unspecified: Secondary | ICD-10-CM

## 2011-04-06 NOTE — Patient Instructions (Signed)
Continue using the benadryl 25 mg as needed for the itching.  You can also use the cortisone cream to help with the itching as well.  Continue taking your medications as prescribed.  Follow up with Dr. Saralyn Pilar in 2 months to recheck your cholesterol.

## 2011-04-06 NOTE — Progress Notes (Signed)
Subjective:   Patient ID: Jenna May female   DOB: 1955/10/12 55 y.o.   MRN: [id number]  HPI: [name] is a 55 y.o. woman who presents today for a rash.  She states that she went to the ER on 8/30 and was given 4 days of prednisone and a week of benadryl.  The rash started 3 weeks ago like mosquito bites then grew and started itching.  She noted several areas including the back of the calves on both side and her right arm.  She denies any rash on her chest, face, or back or upper legs.  She states that she has been using a new dish detergent but no new soaps, lotions, sprays, or clothing detergent.    Past Medical History  Diagnosis Date  . Hypertension   . Tobacco abuse     Quit 2009, after approximately 15 pack year smoking history.  . Anxiety   . 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 Umbilical hernia     S/P repair approximately 1995  . Postmenopausal status   . Bartholin cyst 06/2003  . Normocytic anemia     Unclear etiology. Anemia panel in 03/2010 showing Iron 53 , TIBC 303 , iron saturation percentage 17, ferritin 197 , B12 and folate within normal limits.   Current Outpatient Prescriptions  Medication Sig Dispense Refill  . amLODipine (NORVASC) 5 MG tablet Take 1 tablet (5 mg total) by mouth daily.  30 tablet  2  . Dextromethorphan-Guaifenesin 20-400 MG TABS Take 1 tablet by mouth every 4 (four) hours as needed.        . hydrochlorothiazide 25 MG tablet Take 1 tablet (25 mg total) by mouth daily.  30 tablet  2  . pravastatin (PRAVACHOL) 40 MG tablet Take 1 tablet (40 mg total) by mouth every evening.  30 tablet  2   Family History  Problem Relation Age of Onset  . Hypertension Mother   . Diabetes Father   . Hypertension Father   . Hypertension Sister   . Heart attack Brother 52   History   Social History  . Marital Status: Single    Spouse Name: N/A    Number of Children: N/A  . Years of Education: 12th grade    Occupational History  . uemployed    Social History Main Topics  . Smoking status: Former Smoker -- 0.4 packs/day for 30 years    Types: Cigarettes    Quit date: 04/11/2008  . Smokeless tobacco: Never Used  . Alcohol Use: No  . Drug Use: No  . Sexually Active: Not on file   Other Topics Concern  . Not on file   Social History Narrative   Unemployed. Intermittently worked, Engineer, mining, Copy. Graduated from high school, [employer].Single.No children.[place] card.   Review of Systems: Constitutional: Denies fever, chills, diaphoresis, appetite change and fatigue.  HEENT: Denies photophobia, eye pain, redness, hearing loss, ear pain, congestion, sore throat, rhinorrhea, sneezing, mouth sores, trouble swallowing, neck pain, neck stiffness and tinnitus.   Respiratory: Denies SOB, DOE, cough, chest tightness,  and wheezing.   Cardiovascular: Denies chest pain, palpitations and leg swelling.  Gastrointestinal: Denies nausea, vomiting, abdominal pain, diarrhea, constipation, blood in stool and abdominal distention.  Genitourinary: Denies dysuria, urgency, frequency, hematuria, flank pain and difficulty urinating.  Musculoskeletal: Denies myalgias, back pain, joint swelling, arthralgias and gait problem.  Skin: Positive for rash. Denies pallor, and wound.  Neurological: Denies  dizziness, seizures, syncope, weakness, light-headedness, numbness and headaches.  Hematological: Denies adenopathy. Easy bruising, personal or family bleeding history  Psychiatric/Behavioral: Denies suicidal ideation, mood changes, confusion, nervousness, sleep disturbance and agitation  Objective:  Physical Exam: Filed Vitals:   04/06/11 0817  BP: 148/83  Pulse: 79  Temp: 97.4 F (36.3 C)  TempSrc: Oral  Height: 5\' 6"  (1.676 m)  Weight: 183 lb 4.8 oz (83.144 kg)  SpO2: 100%   Constitutional: Vital signs reviewed.  Patient is a well-developed and well-nourished woman in no acute  distress and cooperative with exam. Alert and oriented x3.  Head: Normocephalic and atraumatic Ear: TM normal bilaterally Mouth: no erythema or exudates, MMM Eyes: PERRL, EOMI, conjunctivae normal, No scleral icterus.  Neck: Supple, Trachea midline normal ROM, No JVD, mass, thyromegaly, or carotid bruit present.  Cardiovascular: RRR, S1 normal, S2 normal, no MRG, pulses symmetric and intact bilaterally Pulmonary/Chest: CTAB, no wheezes, rales, or rhonchi Abdominal: Soft. Non-tender, non-distended, bowel sounds are normal, no masses, organomegaly, or guarding present.  GU: no CVA tenderness Musculoskeletal: No joint deformities, erythema, or stiffness, ROM full and no nontender Hematology: no cervical, inginal, or axillary adenopathy.  Neurological: A&O x3, Strength is normal and symmetric bilaterally, cranial nerve II-XII are grossly intact, no focal motor deficit, sensory intact to light touch bilaterally.  Skin: There are several healing ulcerations with associated excoriation marks on the calves bilaterally.  On the distal upper arm there are a group of small raised areas associated with the hair follicles of the arm.  The are non-erythematous. Skin is otherwise warm, dry and intact. No cyanosis, or clubbing.  Psychiatric: Normal mood and affect. speech and behavior is normal. Judgment and thought content normal. Cognition and memory are normal.   Assessment & Plan:

## 2011-04-10 NOTE — Assessment & Plan Note (Signed)
BP Readings from Last 3 Encounters:  04/06/11 148/83  01/29/11 146/83  11/06/10 148/82   Her blood pressure continues to be elevated.  She state that she has been taking her HCTZ and tolerating it well.  At her follow up if she continues to be elevated we will likely need to add a second drug to her regimen to help control her blood pressure.  Her goal is <140/90.

## 2011-04-10 NOTE — Assessment & Plan Note (Addendum)
With the itching and now ulceration of the lesions on the calves it is difficult to know what it originally looked like.  The lesions on the arm appear to be folliculitis.  We will have her use an antibacterial soap and bacitracin lotion on it.  She can also use benadryl or OTC cortisone cream to help with the itching.

## 2011-04-30 ENCOUNTER — Ambulatory Visit (INDEPENDENT_AMBULATORY_CARE_PROVIDER_SITE_OTHER): Payer: Self-pay | Admitting: Internal Medicine

## 2011-04-30 ENCOUNTER — Encounter: Payer: Self-pay | Admitting: Internal Medicine

## 2011-04-30 VITALS — BP 141/84 | HR 76 | Temp 99.2°F | Ht 66.0 in | Wt 185.3 lb

## 2011-04-30 DIAGNOSIS — L309 Dermatitis, unspecified: Secondary | ICD-10-CM

## 2011-04-30 DIAGNOSIS — L259 Unspecified contact dermatitis, unspecified cause: Secondary | ICD-10-CM

## 2011-04-30 DIAGNOSIS — L209 Atopic dermatitis, unspecified: Secondary | ICD-10-CM | POA: Insufficient documentation

## 2011-04-30 MED ORDER — LORATADINE 10 MG PO TABS: 10.0000 mg | ORAL_TABLET | Freq: Every day | ORAL | Status: DC

## 2011-04-30 MED ORDER — TRIAMCINOLONE ACETONIDE 0.1 % EX OINT: TOPICAL_OINTMENT | Freq: Two times a day (BID) | CUTANEOUS | Status: DC

## 2011-04-30 NOTE — Progress Notes (Signed)
Subjective:   Patient ID: Jenna May female   DOB: 1956/06/24 55 y.o.   MRN: [id number]  HPI: [name] is a 55 y.o. with a PMHx of HTN, anxiety, normocytic anemia who presented to clinic today for the following:  1) Rash - pt describes persistent pruritic rash since 03/26/11, when she was last seen at the ER for this complaint. At that time, she was started on a short course of oral steroids with temporary mild improvement of symptoms. However, symptoms have recurred. Pt is unable to clearly recall initial presentation of the rash, but thinks it started as a macular patch with small punctate raised areas, which started on her arms and legs, has spared her trunk, soles and palms. No associated fevers, chills, nausea, vomiting, diarrhea. No recent tick or mosquito bites.   2) HTN - Patient does not check blood pressure regularly at home. Currently taking amlodipine (Norvase) and hydrochlorothiazide (HCTZ). denies headaches, dizziness, lightheadedness, chest pain, shortness of breath.  does not request refills today.  3) Preventative care - needs flu shot.    Review of Systems: Per HPI.  Current Outpatient Medications: Medication Sig  . amLODipine (NORVASC) 5 MG tablet Take 1 tablet (5 mg total) by mouth daily.  Marland Kitchen Dextromethorphan-Guaifenesin 20-400 MG TABS Take 1 tablet by mouth every 4 (four) hours as needed.    . hydrochlorothiazide 25 MG tablet Take 1 tablet (25 mg total) by mouth daily.  Marland Kitchen loratadine (CLARITIN) 10 MG tablet Take 1 tablet (10 mg total) by mouth daily.  . pravastatin (PRAVACHOL) 40 MG tablet Take 1 tablet (40 mg total) by mouth every evening.    Allergies: Review of patient's allergies indicates no known allergies.  Past Medical History  Diagnosis Date  . Hypertension   . Tobacco abuse     Quit 2009, after approximately 15 pack year smoking history.  . Anxiety   . 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 Umbilical  hernia     S/P repair approximately 1995  . Postmenopausal status   . Bartholin cyst 06/2003  . Normocytic anemia     Unclear etiology. Anemia panel in 03/2010 showing Iron 53 , TIBC 303 , iron saturation percentage 17, ferritin 197 , B12 and folate within normal limits.    Past Surgical History  Procedure Date  . Umbilical hernia repair approximately 1995  . Appendectomy approximately 1991  . Total abdominal hysterectomy 03/1996    2/2 fibroids     Objective:   Physical Exam: Filed Vitals:   04/30/11 1601  BP: 141/84  Pulse: 76  Temp: 99.2 F (37.3 C)      General: Vital signs reviewed and noted. Well-developed, well-nourished, in no acute distress; alert, appropriate and cooperative throughout examination.  Head: Normocephalic, atraumatic.  Lungs:  Normal respiratory effort. Clear to auscultation BL without crackles or wheezes.  Heart: RRR. S1 and S2 normal without gallop, murmur, or rubs.  Abdomen:  BS normoactive. Soft, Nondistended, non-tender.  No masses or organomegaly.  Extremities: No pretibial edema.  Skin: Small hyperpigmented scars on posterior thighs from prior "healed rash". Excoriations on extensor surface of BL forearems, otherwise nonerythematous, non-inflamed.    Assessment & Plan:  Case and plan of care discussed with Dr. Anderson Malta.

## 2011-04-30 NOTE — Patient Instructions (Addendum)
Please follow-up at the clinic in 1 month at which time we will reevaluate rash.  Please follow-up in the clinic sooner if needed.  Use "free and clear" detergents, ones that are hypoallergenic.   If you have been started on new medication(s), and you develop symptoms concerning for allergic reaction, including, but not limited to, throat closing, tongue swelling, rash, please stop the medication immediately and call the clinic at [phone], and go to the ER.  If you are diabetic, please bring your meter to your next visit.  If symptoms worsen, or new symptoms arise, please call the clinic or go to the ER.  Please bring all of your medications in a bag to your next visit.   Eczema / Atopic Dermatitis Atopic dermatitis, or eczema, is an inherited type of sensitive skin. Often people with eczema have a family history of allergies, asthma, or hay fever. It causes a red itchy rash and dry scaly skin. The itchiness may occur before the skin rash and may be very intense. It is not contagious. Eczema is generally worse during the cooler winter months and often improves with the warmth of summer. Eczema usually starts showing signs in infancy. Some children outgrow eczema, but it may last through adulthood. Flare-ups may be caused by:  Eating something or contact with something you are sensitive or allergic to.   Stress.    DIAGNOSIS The diagnosis of eczema is usually based upon symptoms and medical history. TREATMENT Eczema cannot be cured, but symptoms usually can be controlled with treatment or avoidance of allergens (things to which you are sensitive or allergic to).  Controlling the itching and scratching.   Use over-the-counter antihistamines as directed for itching. It is especially useful at night when the itching tends to be worse.   Use over-the-counter steroid creams as directed for itching.   Scratching makes the rash and itching worse and may cause impetigo (a skin infection)  if fingernails are contaminated (dirty).   Keeping the skin well moisturized with creams every day. This will seal in moisture and help prevent dryness. Lotions containing alcohol and water can dry the skin and are not recommended.   Limiting exposure to allergens.   Recognizing situations that cause stress.   Developing a plan to manage stress.  HOME CARE INSTRUCTIONS  Take prescription and over-the-counter medicines as directed by your caregiver.   Do not use anything on the skin without checking with your caregiver.   Keep baths or showers short (5 minutes) in warm (not hot) water. Use mild cleansers for bathing. You may add non-perfumed bath oil to the bath water. It is best to avoid soap and bubble bath.   Immediately after a bath or shower, when the skin is still damp, apply a moisturizing ointment to the entire body. This ointment should be a petroleum ointment. This will seal in moisture and help prevent dryness. The thicker the ointment the better. These should be unscented.   Keep fingernails cut short and wash hands often. If your child has eczema, it may be necessary to put soft gloves or mittens on your child at night.   Dress in clothes made of cotton or cotton blends. Dress lightly, as heat increases itching.   Avoid foods that may cause flare-ups. Common foods include cow's milk, peanut butter, eggs and wheat.   Keep a child with eczema away from anyone with fever blisters. The virus that causes fever blisters (herpes simplex) can cause a serious skin infection in  children with eczema.  SEEK MEDICAL CARE IF:  Itching interferes with sleep.   The rash gets worse or is not better within one week following treatment.   The rash looks infected (pus or soft yellow scabs).   You or your child has an oral temperature above 102 F (38.9 C).   Your baby is older than 3 months with a rectal temperature of 100.5 F (38.1 C) or higher for more than 1 day.   The rash flares  up after contact with someone who has fever blisters.  SEEK IMMEDIATE MEDICAL CARE IF:  Your baby is older than 3 months with a rectal temperature of 102 F (38.9 C) or higher.   Your baby is older than 3 months or younger with a rectal temperature of 100.4 F (38 C) or higher.  Document Released: 07/10/2000 Document Re-Released: 10/07/2009 [facility] Patient Information 2011 [place], [place].

## 2011-05-11 LAB — SYNOVIAL CELL COUNT + DIFF, W/ CRYSTALS
Eosinophils-Synovial: 0
Lymphocytes-Synovial Fld: 51 — ABNORMAL HIGH
Monocyte-Macrophage-Synovial Fluid: 5 — ABNORMAL LOW
Neutrophil, Synovial: 44 — ABNORMAL HIGH
Other Cells-SYN: 0

## 2011-05-11 LAB — BODY FLUID CULTURE: Culture: NO GROWTH

## 2011-05-15 ENCOUNTER — Ambulatory Visit (INDEPENDENT_AMBULATORY_CARE_PROVIDER_SITE_OTHER): Payer: Self-pay | Admitting: Internal Medicine

## 2011-05-15 ENCOUNTER — Encounter: Payer: Self-pay | Admitting: Internal Medicine

## 2011-05-15 VITALS — BP 135/81 | HR 82 | Temp 98.2°F | Ht 66.0 in | Wt 185.6 lb

## 2011-05-15 DIAGNOSIS — I10 Essential (primary) hypertension: Secondary | ICD-10-CM

## 2011-05-15 DIAGNOSIS — E785 Hyperlipidemia, unspecified: Secondary | ICD-10-CM

## 2011-05-15 DIAGNOSIS — L259 Unspecified contact dermatitis, unspecified cause: Secondary | ICD-10-CM

## 2011-05-15 DIAGNOSIS — L309 Dermatitis, unspecified: Secondary | ICD-10-CM

## 2011-05-15 MED ORDER — HYDROCHLOROTHIAZIDE 25 MG PO TABS: 25.0000 mg | ORAL_TABLET | Freq: Every day | ORAL | Status: DC

## 2011-05-15 MED ORDER — AMLODIPINE BESYLATE 5 MG PO TABS: 5.0000 mg | ORAL_TABLET | Freq: Every day | ORAL | Status: DC

## 2011-05-15 NOTE — Progress Notes (Signed)
Subjective:   Patient ID: Jenna May female   DOB: 1955-10-13 55 y.o.   MRN: [id number]  HPI: JennaJenna May is a 55 y.o. woman who presents today for follow up from her last visit.  She was seen by Dr. Saralyn Pilar on 04/30/11 and diagnosed with Eczema.  She states that she has been using the cream that she was given and taking the Claritin.  She continues to have a small patch on her right arm that is mildly pruritic but it has otherwise cleared up.  She states that both medications have helped.  She denies fever, chills, or drainage from the area.    She also states that she hasn't been taking her pravastatin mostly because of the cost of her medications.  She was also wondering why she needed the medication.  She states that she has been taking all of her other medications as prescribed.   Past Medical History  Diagnosis Date  . Hypertension   . Tobacco abuse     Quit 2009, after approximately 15 pack year smoking history.  . Anxiety   . 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 Umbilical hernia     S/P repair approximately 1995  . Postmenopausal status   . Bartholin cyst 06/2003  . Normocytic anemia     Unclear etiology. Anemia panel in 03/2010 showing Iron 53 , TIBC 303 , iron saturation percentage 17, ferritin 197 , B12 and folate within normal limits.   Current Outpatient Prescriptions  Medication Sig Dispense Refill  . amLODipine (NORVASC) 5 MG tablet Take 1 tablet (5 mg total) by mouth daily.  30 tablet  2  . Dextromethorphan-Guaifenesin 20-400 MG TABS Take 1 tablet by mouth every 4 (four) hours as needed.        . hydrochlorothiazide 25 MG tablet Take 1 tablet (25 mg total) by mouth daily.  30 tablet  2  . loratadine (CLARITIN) 10 MG tablet Take 1 tablet (10 mg total) by mouth daily.  30 tablet  0  . pravastatin (PRAVACHOL) 40 MG tablet Take 1 tablet (40 mg total) by mouth every evening.  30 tablet  2  . triamcinolone (KENALOG) 0.1 %  ointment Apply topically 2 (two) times daily.  80 g  0   Family History  Problem Relation Age of Onset  . Hypertension Mother   . Diabetes Father   . Hypertension Father   . Hypertension Sister   . Heart attack Brother 74   History   Social History  . Marital Status: Single    Spouse Name: N/A    Number of Children: N/A  . Years of Education: 12th grade   Occupational History  . uemployed    Social History Main Topics  . Smoking status: Former Smoker -- 0.4 packs/day for 30 years    Types: Cigarettes    Quit date: 04/11/2008  . Smokeless tobacco: Never Used  . Alcohol Use: No  . Drug Use: No  . Sexually Active: None   Other Topics Concern  . None   Social History Narrative   Unemployed. Intermittently worked, Engineer, mining, Copy. Graduated from high school, [employer].Single.No children.Or[place] card.   Review of Systems: Constitutional: Denies fever, chills, diaphoresis, appetite change and fatigue.  HEENT: Denies photophobia, eye pain, redness, hearing loss, ear pain, congestion, sore throat, rhinorrhea, sneezing, mouth sores, trouble swallowing, neck pain, neck stiffness and tinnitus.   Respiratory: Denies SOB, DOE, cough, chest tightness,  and wheezing.   Cardiovascular: Denies chest pain, palpitations and leg swelling.  Gastrointestinal: Denies nausea, vomiting, abdominal pain, diarrhea, constipation, blood in stool and abdominal distention.  Genitourinary: Denies dysuria, urgency, frequency, hematuria, flank pain and difficulty urinating.  Musculoskeletal: Denies myalgias, back pain, joint swelling, arthralgias and gait problem.  Skin: Positive for rash. Denies pallor, and wound.  Neurological: Denies dizziness, seizures, syncope, weakness, light-headedness, numbness and headaches.  Hematological: Denies adenopathy. Easy bruising, personal or family bleeding history  Psychiatric/Behavioral: Denies suicidal ideation, mood changes, confusion,  nervousness, sleep disturbance and agitation  Objective:  Physical Exam: Filed Vitals:   05/15/11 1353  BP: 135/81  Pulse: 82  Temp: 98.2 F (36.8 C)  TempSrc: Oral  Height: 5\' 6"  (1.676 m)  Weight: 185 lb 9.6 oz (84.188 kg)   Constitutional: Vital signs reviewed.  Patient is a well-developed and well-nourished woman in no acute distress and cooperative with exam. Alert and oriented x3.  Head: Normocephalic and atraumatic Ear: TM normal bilaterally Mouth: no erythema or exudates, MMM Eyes: PERRL, EOMI, conjunctivae normal, No scleral icterus.  Neck: Supple, Trachea midline normal ROM, No JVD, mass, thyromegaly, or carotid bruit present.  Cardiovascular: RRR, S1 normal, S2 normal, no MRG, pulses symmetric and intact bilaterally Pulmonary/Chest: CTAB, no wheezes, rales, or rhonchi Abdominal: Soft. Non-tender, non-distended, bowel sounds are normal, no masses, organomegaly, or guarding present.  GU: no CVA tenderness Musculoskeletal: No joint deformities, erythema, or stiffness, ROM full and no nontender Hematology: no cervical, inginal, or axillary adenopathy.  Neurological: A&O x3, Strenght is normal and symmetric bilaterally, cranial nerve II-XII are grossly intact, no focal motor deficit, sensory intact to light touch bilaterally.  Skin: There is a collection of <5 mm erythematous areas on her right forearm just distal to the elbow with surrounding excoriation marks.  No drainage and it is not warmer then the surrounding area.  Warm, dry and intact. No rash, cyanosis, or clubbing.  Psychiatric: Normal mood and affect. speech and behavior is normal. Judgment and thought content normal. Cognition and memory are normal.   Assessment & Plan:

## 2011-05-15 NOTE — Patient Instructions (Signed)
Continue using the ointment for your eczema.    When you run out of the Claritin you can pick up Zyrtec (Cetirizine) 10 mg tablet 1 tablet daily.  I will refill your medications on the 24th.  Work on taking your cholesterol medication as well.  Diet and exercise are helpful to lower your cholesterol as well.  Exercise 30 minutes daily three times weekly.  Also making healthy meat choices, choosing Pork, Fish, or Chicken over Beef or fried foods.    Follow up with your PCP in November.

## 2011-05-20 NOTE — Assessment & Plan Note (Signed)
Patient's rash is most consistent with eczema, atopic dermatitis, likely worsened by recent new detergent. Had prior improvement with oral steroids prescribed at the ER. - Will start Triamcinolone cream.

## 2011-06-02 NOTE — Assessment & Plan Note (Signed)
She continues to be bothered by the Eczema on her arm though she thinks the medications have helped a lot.  We will have her switch to Zyrtec when she runs out of the Claritin to see if that helps with the itching more.

## 2011-06-02 NOTE — Assessment & Plan Note (Signed)
Lab Results  Component Value Date   CHOL 266* 11/06/2010   CHOL 219* 05/08/2009   Lab Results  Component Value Date   HDL 36* 11/06/2010   HDL 39* 05/08/2009   Lab Results  Component Value Date   LDLCALC 222* 11/06/2010   LDLCALC 103* 05/08/2009   Lab Results  Component Value Date   TRIG 38 11/06/2010   TRIG 385* 05/08/2009   Lab Results  Component Value Date   CHOLHDL 7.4 11/06/2010   CHOLHDL 5.6 Ratio 05/08/2009   No results found for this basename: LDLDIRECT   We discussed her cholesterol and the reasoning behind the medication.  She was informed that with her increased LDL the goal would be to have her's below 160.  Since she was above that we discussed that diet and exercise would not be enough to control it.  She is agreeable to starting the medication and will fill the prescription.  We will need to recheck her cholesterol in about 3 months.

## 2011-06-04 ENCOUNTER — Ambulatory Visit (INDEPENDENT_AMBULATORY_CARE_PROVIDER_SITE_OTHER): Payer: Self-pay | Admitting: Internal Medicine

## 2011-06-04 ENCOUNTER — Encounter: Payer: Self-pay | Admitting: Internal Medicine

## 2011-06-04 VITALS — BP 142/83 | HR 82 | Temp 97.8°F | Ht 66.0 in | Wt 190.4 lb

## 2011-06-04 DIAGNOSIS — Z23 Encounter for immunization: Secondary | ICD-10-CM

## 2011-06-04 DIAGNOSIS — E785 Hyperlipidemia, unspecified: Secondary | ICD-10-CM

## 2011-06-04 DIAGNOSIS — L259 Unspecified contact dermatitis, unspecified cause: Secondary | ICD-10-CM

## 2011-06-04 DIAGNOSIS — I10 Essential (primary) hypertension: Secondary | ICD-10-CM

## 2011-06-04 DIAGNOSIS — L309 Dermatitis, unspecified: Secondary | ICD-10-CM

## 2011-06-04 MED ORDER — PRAVASTATIN SODIUM 40 MG PO TABS: 40.0000 mg | ORAL_TABLET | Freq: Every evening | ORAL | Status: DC

## 2011-06-04 MED ORDER — LORATADINE 10 MG PO TABS: 10.0000 mg | ORAL_TABLET | Freq: Every day | ORAL | Status: DC

## 2011-06-04 NOTE — Patient Instructions (Addendum)
Please follow-up at the clinic in 3-6 months, at which time we will reevaluate your cholesterol - please be fasting for the visit (no eating/drinking x 8 hours).  Please follow-up in the clinic sooner if needed.  If you have been started on new medication(s), and you develop symptoms concerning for allergic reaction, including, but not limited to, throat closing, tongue swelling, rash, please stop the medication immediately and call the clinic at [phone], and go to the ER.  If you are diabetic, please bring your meter to your next visit.  If symptoms worsen, or new symptoms arise, please call the clinic or go to the ER.  Please bring all of your medications in a bag to your next visit.    Cholesterol Control Diet Cholesterol levels in your body are determined significantly by your diet. Cholesterol levels may also be related to heart disease. The following material helps to explain this relationship and discusses what you can do to help keep your heart healthy. Not all cholesterol is bad. Low-density lipoprotein (LDL) cholesterol is the "bad" cholesterol. It may cause fatty deposits to build up inside your arteries. High-density lipoprotein (HDL) cholesterol is "good." It helps to remove the "bad" LDL cholesterol from your blood. Cholesterol is a very important risk factor for heart disease. Other risk factors are high blood pressure, smoking, stress, heredity, and weight. The heart muscle gets its supply of blood through the coronary arteries. If your LDL cholesterol is high and your HDL cholesterol is low, you are at risk for having fatty deposits build up in your coronary arteries. This leaves less room through which blood can flow. Without sufficient blood and oxygen, the heart muscle cannot function properly and you may feel chest pains (angina pectoris). When a coronary artery closes up entirely, a part of the heart muscle may die, causing a heart attack (myocardial infarction).  CHECKING  CHOLESTEROL When your caregiver sends your blood to a lab to be analyzed for cholesterol, a complete lipid (fat) profile may be done. With this test, the total amount of cholesterol and levels of LDL and HDL are determined. Triglycerides are a type of fat that circulates in the blood and can also be used to determine heart disease risk. The list below describes what the numbers should be:  Test: Total Cholesterol.  Less than 200 mg/dl.  Test: LDL "bad cholesterol."  Less than 100 mg/dl.     Less than 70 mg/dl if you are at very high risk of a heart attack or sudden cardiac death.  Test: HDL "good cholesterol."  Greater than 50 mg/dl for women.     Greater than 40 mg/dl for men.  Test: Triglycerides.  Less than 150 mg/dl.  CONTROLLING CHOLESTEROL WITH DIET Although exercise and lifestyle factors are important, your diet is key. That is because certain foods are known to raise cholesterol and others to lower it. The goal is to balance foods for their effect on cholesterol and more importantly, to replace saturated and trans fat with other types of fat, such as monounsaturated fat, polyunsaturated fat, and omega-3 fatty acids. On average, a person should consume no more than 15 to 17 g of saturated fat daily. Saturated and trans fats are considered "bad" fats, and they will raise LDL cholesterol. Saturated fats are primarily found in animal products such as meats, butter, and cream. However, that does not mean you need to sacrifice all your favorite foods. Today, there are good tasting, low-fat, low-cholesterol substitutes for most of  the things you like to eat. Choose low-fat or nonfat alternatives. Choose round or loin cuts of red meat, since these types of cuts are lowest in fat and cholesterol. Chicken (without the skin), fish, veal, and ground [place] breast are excellent choices. Eliminate fatty meats, such as hot dogs and salami. Even shellfish have little or no saturated fat. Have a 3 oz (85  g) portion when you eat lean meat, poultry, or fish. Trans fats are also called "partially hydrogenated oils." They are oils that have been scientifically manipulated so that they are solid at room temperature resulting in a longer shelf life and improved taste and texture of foods in which they are added. Trans fats are found in stick margarine, some tub margarines, cookies, crackers, and baked goods.   When baking and cooking, oils are an excellent substitute for butter. The monounsaturated oils are especially beneficial since it is believed they lower LDL and raise HDL. The oils you should avoid entirely are saturated tropical oils, such as coconut and palm.   Remember to eat liberally from food groups that are naturally free of saturated and trans fat, including fish, fruit, vegetables, beans, grains (barley, rice, couscous, bulgur wheat), and pasta (without cream sauces).   IDENTIFYING FOODS THAT LOWER CHOLESTEROL   Soluble fiber may lower your cholesterol. This type of fiber is found in fruits such as apples, vegetables such as broccoli, potatoes, and carrots, legumes such as beans, peas, and lentils, and grains such as barley. Foods fortified with plant sterols (phytosterol) may also lower cholesterol. You should eat at least 2 g per day of these foods for a cholesterol lowering effect.   Read package labels to identify low-saturated fats, trans fats free, and low-fat foods at the supermarket. Select cheeses that have only 2 to 3 g saturated fat per ounce. Use a heart-healthy tub margarine that is free of trans fats or partially hydrogenated oil. When buying baked goods (cookies, crackers), avoid partially hydrogenated oils. Breads and muffins should be made from whole grains (whole-wheat or whole oat flour, instead of "flour" or "enriched flour"). Buy non-creamy canned soups with reduced salt and no added fats.   FOOD PREPARATION TECHNIQUES   Never deep-fry. If you must fry, either stir-fry, which  uses very little fat, or use non-stick cooking sprays. When possible, broil, bake, or roast meats, and steam vegetables. Instead of dressing vegetables with butter or margarine, use lemon and herbs, applesauce and cinnamon (for squash and sweet potatoes), nonfat yogurt, salsa, and low-fat dressings for salads.   LOW-SATURATED FAT / LOW-FAT FOOD SUBSTITUTES Meats / Saturated Fat (g)  Avoid: Steak, marbled (3 oz/85 g) / 11 g     Choose: Steak, lean (3 oz/85 g) / 4 g     Avoid: Hamburger (3 oz/85 g) / 7 g     Choose: Hamburger, lean (3 oz/85 g) / 5 g     Avoid: Ham (3 oz/85 g) / 6 g     Choose: Ham, lean cut (3 oz/85 g) / 2.4 g     Avoid: Chicken, with skin, dark meat (3 oz/85 g) / 4 g     Choose: Chicken, skin removed, dark meat (3 oz/85 g) / 2 g     Avoid: Chicken, with skin, light meat (3 oz/85 g) / 2.5 g     Choose: Chicken, skin removed, light meat (3 oz/85 g) / 1 g  Dairy / Saturated Fat (g)  Avoid: Whole milk (1 cup) / 5 g  Choose: Low-fat milk, 2% (1 cup) / 3 g     Choose: Low-fat milk, 1% (1 cup) / 1.5 g     Choose: Skim milk (1 cup) / 0.3 g     Avoid: Hard cheese (1 oz/28 g) / 6 g     Choose: Skim milk cheese (1 oz/28 g) / 2 to 3 g     Avoid: Cottage cheese, 4% fat (1 cup) / 6.5 g     Choose: Low-fat cottage cheese, 1% fat (1 cup) / 1.5 g     Avoid: Ice cream (1 cup) / 9 g     Choose: Sherbet (1 cup) / 2.5 g     Choose: Nonfat frozen yogurt (1 cup) / 0.3 g     Choose: Frozen fruit bar / trace     Avoid: Whipped cream (1 tbs) / 3.5 g     Choose: Nondairy whipped topping (1 tbs) / 1 g  Condiments / Saturated Fat (g)  Avoid: Mayonnaise (1 tbs) / 2 g     Choose: Low-fat mayonnaise (1 tbs) / 1 g     Avoid: Butter (1 tbs) / 7 g     Choose: Extra light margarine (1 tbs) / 1 g     Avoid: Coconut oil (1 tbs) / 11.8 g     Choose: Olive oil (1 tbs) / 1.8 g     Choose: Corn oil (1 tbs) / 1.7 g     Choose: Safflower oil (1 tbs) / 1.2 g     Choose:  Sunflower oil (1 tbs) / 1.4 g     Choose: Soybean oil (1 tbs) / 2.4 g     Choose: Canola oil (1 tbs) / 1 g  Document Released: 07/13/2005 Document Revised: 03/25/2011 Document Reviewed: 01/01/2011 [facility] Patient Information 2012 [place], [place].

## 2011-06-04 NOTE — Progress Notes (Signed)
Subjective:   Patient ID: Jenna May female   DOB: 1956/03/29 55 y.o.   MRN: [id number]  HPI: Jenna May is a 55 y.o. with a PMHx of HTN, anxiety, normocytic anemia who presented to clinic today for the following:  1) Eczema - Pt was diagnosed with eczema at the beginning of October, at which time she was started on Triamcinolone cream and Zyrtec, then changed to Claritin on Oct 19 because of persistent pruritis. Pt notes significant improvement of her rash. She is also taking an over the counter tea for her eczema. Has been off of Claritin x 1 week, but has had continued to have stabilization of symptoms. She is using her triamcinolone only once a day. Denies fevers, chills, nausea, vomiting, diarrhea. No recent tick or mosquito bites.   2) HTN - Patient does not check blood pressure regularly at home. Currently taking amlodipine (Norvase) and hydrochlorothiazide (HCTZ), takes them regularly. Denies headaches, dizziness, lightheadedness, chest pain, shortness of breath.  does not request refills today.  3) HLD - currently taking Zocor (simvastatin), has been off of this medication for over 1 month, because was very concerned about her acute rash last month.  denies chest pain, difficulty breathing, palpitations, tachycardia, and muscle pains. does request refills today.    Review of Systems: Per HPI.  Current Outpatient Medications: Medication Sig Dispense Refill  . amLODipine (NORVASC) 5 MG tablet Take 1 tablet (5 mg total) by mouth daily.  30 tablet  2  . hydrochlorothiazide (HYDRODIURIL) 25 MG tablet Take 1 tablet (25 mg total) by mouth daily.  30 tablet  2  . triamcinolone (KENALOG) 0.1 % ointment Apply topically 2 (two) times daily.  80 g  0  . loratadine (CLARITIN) 10 MG tablet Take 1 tablet (10 mg total) by mouth daily.  30 tablet  0  . pravastatin (PRAVACHOL) 40 MG tablet Take 1 tablet (40 mg total) by mouth every evening.  30 tablet  2    Allergies: No Known  Allergies  Past Medical History  Diagnosis Date  . Hypertension   . Tobacco abuse     Quit 2009, after approximately 15 pack year smoking history.  . Anxiety   . 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 Umbilical hernia     S/P repair approximately 1995  . Postmenopausal status   . Bartholin cyst 06/2003  . Normocytic anemia     Unclear etiology. Anemia panel in 03/2010 showing Iron 53 , TIBC 303 , iron saturation percentage 17, ferritin 197 , B12 and folate within normal limits.    Past Surgical History  Procedure Date  . Umbilical hernia repair approximately 1995  . Appendectomy approximately 1991  . Total abdominal hysterectomy 03/1996    2/2 fibroids     Objective:   Physical Exam: Filed Vitals:   06/04/11 1400  BP: 142/83  Pulse: 82  Temp: 97.8 F (36.6 C)      General: Vital signs reviewed and noted. Well-developed, well-nourished, in no acute distress; alert, appropriate and cooperative throughout examination.  Head: Normocephalic, atraumatic.  Lungs:  Normal respiratory effort. Clear to auscultation BL without crackles or wheezes.  Heart: RRR. S1 and S2 normal without gallop, murmur, or rubs.  Abdomen:  BS normoactive. Soft, Nondistended, non-tender.  No masses or organomegaly.  Extremities: No pretibial edema.  Skin: Hyperpigmented healed scars on forearms from prior rash. No acute rashes appreciated.    Assessment & Plan:  Case and  plan of care discussed with Dr. Doneen Poisson.

## 2011-06-05 ENCOUNTER — Encounter: Payer: Self-pay | Admitting: Internal Medicine

## 2011-06-06 NOTE — Assessment & Plan Note (Addendum)
BP Readings from Last 3 Encounters:  06/04/11 142/83  05/15/11 135/81  04/30/11 141/84    Basic Metabolic Panel:    Component Value Date/Time   NA 139 11/06/2010 1426   K 3.7 11/06/2010 1426   CL 102 11/06/2010 1426   CO2 23 11/06/2010 1426   BUN 10 11/06/2010 1426   CREATININE 0.65 11/06/2010 1426   CREATININE 0.71 04/12/2010 0559   GLUCOSE 87 11/06/2010 1426   CALCIUM 10.1 11/06/2010 1426    Assessment: Hypertension Control: uncontrolled  Progress toward goals: unchanged  Barriers to meeting goals: no barriers identified   Plan:  Continue HCTZ  Escalate Amlodipine to 10 mg daily - unfortunately, I saw Jenna May's trend of persistently elevated blood pressure after she left the office, therefore, I called the pt to let her know of this medication change and that I would be sending a new prescription to her pharmacy.

## 2011-06-09 MED ORDER — AMLODIPINE BESYLATE 10 MG PO TABS: 10.0000 mg | ORAL_TABLET | Freq: Every day | ORAL | Status: DC

## 2011-06-09 NOTE — Assessment & Plan Note (Signed)
Liver Function Tests:    Component Value Date/Time   AST 17 11/06/2010 1426   ALT 33 11/06/2010 1426   ALKPHOS 53 11/06/2010 1426   BILITOT 0.4 11/06/2010 1426   PROT 7.9 11/06/2010 1426   ALBUMIN 4.6 11/06/2010 1426    Lipid Panel:     Component Value Date/Time   CHOL 266* 11/06/2010 1426   TRIG 38 11/06/2010 1426   HDL 36* 11/06/2010 1426   CHOLHDL 7.4 11/06/2010 1426   VLDL 8 11/06/2010 1426   LDLCALC 222* 11/06/2010 1426     Assessment: Lipid Control: not controlled  Progress toward goals: deteriorated  Barriers to meeting goals: nonadherence to medications, pt has been counseled repeatedly about medication compliance with her statin. However, she states her acute illnesses get her so worried that she doesn't think about the long-term effects of non-compliance with statin therapy.   Plan:  continue current medications  Pt again counseled extensively about risks of noncompliance with statin therapy for treatment of her hyperlipidemia, including, but not limited to increased cardiac risk.   Pt expresses understanding, and indicates she will try to be compliant once again.  Will need CMET next visit to eval her liver function after starting the statin regularly.

## 2011-06-09 NOTE — Assessment & Plan Note (Signed)
Assessment: Disease Control: controlled  Progress toward goals: at goal  Barriers to meeting goals: no barriers identified    Plan: Treatment:      continue current medications  If continues to be stable and not requiring regular triamcinolone, can consider change to a less potent steroid next visit, such as hydrocortisone.

## 2011-06-22 ENCOUNTER — Telehealth: Payer: Self-pay | Admitting: *Deleted

## 2011-06-22 ENCOUNTER — Other Ambulatory Visit (HOSPITAL_COMMUNITY): Payer: Self-pay | Admitting: Neurology

## 2011-06-22 DIAGNOSIS — Z1231 Encounter for screening mammogram for malignant neoplasm of breast: Secondary | ICD-10-CM

## 2011-06-22 NOTE — Telephone Encounter (Signed)
Return pt's call.  Pt states she received a letter from Dr Thad Ranger to increase Amlodipine to 10mg . Wanted to know which pharmacy rx was called to; d/t  Financial issues.  I called rx to [facility] pharmacy which cost approx. $8.00; [facility] cost is $21.72; pt agreeable.

## 2011-07-16 ENCOUNTER — Other Ambulatory Visit: Payer: Self-pay | Admitting: *Deleted

## 2011-07-16 MED ORDER — TRIAMCINOLONE ACETONIDE 0.1 % EX OINT: TOPICAL_OINTMENT | Freq: Two times a day (BID) | CUTANEOUS | Status: DC

## 2011-07-16 NOTE — Telephone Encounter (Signed)
I am approving this x 1 month, but she needs to come back in, I want to likely get her a less potent steroid if her symptoms continue to be stable. Please tell her to come in and let us reevaluate.  Thank you.  Johnette Abraham, D.O.

## 2011-07-23 ENCOUNTER — Other Ambulatory Visit: Payer: Self-pay | Admitting: Internal Medicine

## 2011-07-23 ENCOUNTER — Ambulatory Visit (HOSPITAL_COMMUNITY)
Admission: RE | Admit: 2011-07-23 | Discharge: 2011-07-23 | Disposition: A | Discharge status: Home / Self Care | Payer: Self-pay | Source: Ambulatory Visit | Attending: Neurology | Admitting: Neurology

## 2011-07-23 DIAGNOSIS — Z1231 Encounter for screening mammogram for malignant neoplasm of breast: Secondary | ICD-10-CM | POA: Insufficient documentation

## 2011-08-04 ENCOUNTER — Other Ambulatory Visit: Payer: Self-pay | Admitting: Neurology

## 2011-08-04 DIAGNOSIS — R928 Other abnormal and inconclusive findings on diagnostic imaging of breast: Secondary | ICD-10-CM

## 2011-08-11 ENCOUNTER — Ambulatory Visit
Admission: RE | Admit: 2011-08-11 | Discharge: 2011-08-11 | Disposition: A | Discharge status: Home / Self Care | Payer: Self-pay | Source: Ambulatory Visit | Attending: Neurology | Admitting: Neurology

## 2011-08-11 DIAGNOSIS — R928 Other abnormal and inconclusive findings on diagnostic imaging of breast: Secondary | ICD-10-CM

## 2011-08-18 ENCOUNTER — Encounter: Payer: Self-pay | Admitting: Internal Medicine

## 2011-08-18 ENCOUNTER — Ambulatory Visit (INDEPENDENT_AMBULATORY_CARE_PROVIDER_SITE_OTHER): Payer: Self-pay | Admitting: Internal Medicine

## 2011-08-18 DIAGNOSIS — E785 Hyperlipidemia, unspecified: Secondary | ICD-10-CM

## 2011-08-18 DIAGNOSIS — L309 Dermatitis, unspecified: Secondary | ICD-10-CM

## 2011-08-18 DIAGNOSIS — I10 Essential (primary) hypertension: Secondary | ICD-10-CM

## 2011-08-18 MED ORDER — LORATADINE 10 MG PO TABS: 10.0000 mg | ORAL_TABLET | Freq: Every day | ORAL | Status: DC

## 2011-08-18 MED ORDER — HYDROCHLOROTHIAZIDE 25 MG PO TABS: 25.0000 mg | ORAL_TABLET | Freq: Every day | ORAL | Status: DC

## 2011-08-18 NOTE — Progress Notes (Signed)
Subjective:   Patient ID: Jenna May female   DOB: Jun 22, 1956 56 y.o.   MRN: [id number]  HPI: [name] is a 56 y.o. female with past medical history significant as outlined below who presented to the clinic for regular office visit. She reports she's not clear why she received an appointment reminder by doing this office visit patient reports that she has been not taking her pravastatin as recommended during the last office visit. Patient noted that she has been experiencing worsening eczema after starting her pravastatin although she has not 100% sure about this. She continues to take her triamcinolone ointment on a regular basis.    Past Medical History  Diagnosis Date  . Hypertension   . Tobacco abuse     Quit 2009, after approximately 15 pack year smoking history.  . Anxiety   . 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 Umbilical hernia     S/P repair approximately 1995  . Postmenopausal status   . Bartholin cyst 06/2003  . Normocytic anemia     Unclear etiology. Anemia panel in 03/2010 showing Iron 53 , TIBC 303 , iron saturation percentage 17, ferritin 197 , B12 and folate within normal limits.   Current Outpatient Prescriptions  Medication Sig Dispense Refill  . amLODipine (NORVASC) 10 MG tablet Take 1 tablet (10 mg total) by mouth daily.  30 tablet  5  . hydrochlorothiazide (HYDRODIURIL) 25 MG tablet Take 1 tablet (25 mg total) by mouth daily.  30 tablet  2  . loratadine (CLARITIN) 10 MG tablet Take 1 tablet (10 mg total) by mouth daily.  30 tablet  2  . pravastatin (PRAVACHOL) 40 MG tablet Take 1 tablet (40 mg total) by mouth every evening.  30 tablet  5  . triamcinolone ointment (KENALOG) 0.1 % Apply topically 2 (two) times daily.  80 g  0   Family History  Problem Relation Age of Onset  . Hypertension Mother   . Diabetes Father   . Hypertension Father   . Hypertension Sister   . Heart attack Brother 35   History    Social History  . Marital Status: Single    Spouse Name: N/A    Number of Children: N/A  . Years of Education: 12th grade   Occupational History  . uemployed    Social History Main Topics  . Smoking status: Former Smoker -- 0.4 packs/day for 30 years    Types: Cigarettes    Quit date: 04/11/2008  . Smokeless tobacco: Never Used  . Alcohol Use: No  . Drug Use: No  . Sexually Active: None   Other Topics Concern  . None   Social History Narrative   Unemployed. Intermittently worked, Engineer, mining, Copy. Graduated from high school, [employer].Single.No children.[place] card.   Review of Systems: Constitutional: Denies fever, chills, diaphoresis, appetite change and fatigue.   Respiratory: Denies SOB, DOE, cough, chest tightness,  and wheezing.   Cardiovascular: Denies chest pain, palpitations and leg swelling.  Gastrointestinal: Denies nausea, vomiting, abdominal pain, .  Skin: Continues to have eczema    Objective:  Physical Exam: Filed Vitals:   08/18/11 1438 08/18/11 1542  BP: 148/81 129/76  Pulse: 75 73  Temp: 98.1 F (36.7 C)   TempSrc: Oral   Height: 5\' 6"  (1.676 m)   Weight: 184 lb 11.2 oz (83.779 kg)    Constitutional: Vital signs reviewed.  Patient is a well-developed and well-nourished  in  no acute distress and cooperative with exam. Alert and oriented x3.   Cardiovascular: RRR, S1 normal, S2 normal, pulses symmetric and intact bilaterally Pulmonary/Chest: CTAB, no wheezes, rales, or rhonchi Abdominal: Soft. Non-tender, non-distended, bowel sounds are normal, Neurological: A&O x3,  Skin: Hyperpigmented scars on forearms present.

## 2011-08-18 NOTE — Assessment & Plan Note (Signed)
I encouraged the patient to take pravastatin on a daily basis. I advised her to ride a diary to review during the next office visit if worsening eczema symptoms are related to pravastatin intake. Patient reports that she will try again. If patient continues to take pravastatin liver enzymes need to be checked during the next office visit.

## 2011-08-18 NOTE — Patient Instructions (Addendum)
Please take your cholesterol medication . Please write a diary and bring it with you  during next office visit.

## 2011-08-18 NOTE — Assessment & Plan Note (Signed)
Blood pressure well-controlled. Continue current regimen. Refill for hydrochlorothiazide was sent to the pharmacy. BP Readings from Last 3 Encounters:  08/18/11 129/76  06/04/11 142/83  05/15/11 135/81

## 2011-10-10 ENCOUNTER — Emergency Department (HOSPITAL_COMMUNITY)
Admission: EM | Admit: 2011-10-10 | Discharge: 2011-10-10 | Disposition: A | Discharge status: Home / Self Care | Payer: Self-pay | Attending: Emergency Medicine | Admitting: Emergency Medicine

## 2011-10-10 ENCOUNTER — Ambulatory Visit (HOSPITAL_COMMUNITY)
Admission: RE | Admit: 2011-10-10 | Discharge: 2011-10-10 | Disposition: A | Discharge status: Home / Self Care | Payer: Self-pay | Source: Ambulatory Visit | Attending: Psychiatry | Admitting: Psychiatry

## 2011-10-10 ENCOUNTER — Encounter (HOSPITAL_COMMUNITY): Payer: Self-pay | Admitting: *Deleted

## 2011-10-10 DIAGNOSIS — F1010 Alcohol abuse, uncomplicated: Secondary | ICD-10-CM | POA: Insufficient documentation

## 2011-10-10 DIAGNOSIS — Z79899 Other long term (current) drug therapy: Secondary | ICD-10-CM | POA: Insufficient documentation

## 2011-10-10 DIAGNOSIS — F411 Generalized anxiety disorder: Secondary | ICD-10-CM | POA: Insufficient documentation

## 2011-10-10 DIAGNOSIS — I10 Essential (primary) hypertension: Secondary | ICD-10-CM | POA: Insufficient documentation

## 2011-10-10 LAB — COMPREHENSIVE METABOLIC PANEL
ALT: 28 U/L (ref 0–35)
AST: 15 U/L (ref 0–37)
Alkaline Phosphatase: 71 U/L (ref 39–117)
CO2: 24 mEq/L (ref 19–32)
Chloride: 97 mEq/L (ref 96–112)
GFR calc Af Amer: >90 mL/min (ref >90)
GFR calc non Af Amer: >90 mL/min (ref >90)
Glucose, Bld: 131 mg/dL — ABNORMAL HIGH (ref 70–99)
Potassium: 3.2 mEq/L — ABNORMAL LOW (ref 3.5–5.1)
Sodium: 137 mEq/L (ref 135–145)
Total Bilirubin: 0.3 mg/dL (ref 0.3–1.2)

## 2011-10-10 LAB — URINALYSIS, ROUTINE W REFLEX MICROSCOPIC
Glucose, UA: NEGATIVE mg/dL
Ketones, ur: NEGATIVE mg/dL
Protein, ur: NEGATIVE mg/dL
pH: 5 (ref 5.0–8.0)

## 2011-10-10 LAB — DIFFERENTIAL
Basophils Absolute: 0.1 10*3/uL (ref 0.0–0.1)
Eosinophils Absolute: 0.1 10*3/uL (ref 0.0–0.7)
Eosinophils Relative: 1 % (ref 0–5)
Lymphocytes Relative: 41 % (ref 12–46)
Lymphs Abs: 3.8 10*3/uL (ref 0.7–4.0)
Neutrophils Relative %: 52 % (ref 43–77)

## 2011-10-10 LAB — CBC
MCH: 28.5 pg (ref 26.0–34.0)
MCV: 86.7 fL (ref 78.0–100.0)
Platelets: 255 10*3/uL (ref 150–400)
RBC: 4.28 MIL/uL (ref 3.87–5.11)
RDW: 13.9 % (ref 11.5–15.5)
WBC: 9.2 10*3/uL (ref 4.0–10.5)

## 2011-10-10 LAB — RAPID URINE DRUG SCREEN, HOSP PERFORMED
Amphetamines: NOT DETECTED
Benzodiazepines: NOT DETECTED
Tetrahydrocannabinol: NOT DETECTED

## 2011-10-10 MED ORDER — AMLODIPINE BESYLATE 10 MG PO TABS: 10.0000 mg | ORAL_TABLET | Freq: Every day | ORAL | Status: DC

## 2011-10-10 MED ORDER — HYDROCHLOROTHIAZIDE 25 MG PO TABS: 25.0000 mg | ORAL_TABLET | Freq: Every day | ORAL | Status: DC

## 2011-10-10 MED ORDER — LORATADINE 10 MG PO TABS: 10.0000 mg | ORAL_TABLET | Freq: Every day | ORAL | Status: DC

## 2011-10-10 MED ORDER — ACETAMINOPHEN 325 MG PO TABS: 650.0000 mg | ORAL_TABLET | Freq: Four times a day (QID) | ORAL | Status: DC | PRN

## 2011-10-10 MED ORDER — SIMVASTATIN 40 MG PO TABS: 40.0000 mg | ORAL_TABLET | Freq: Every day | ORAL | Status: DC

## 2011-10-10 MED ORDER — ALUM & MAG HYDROXIDE-SIMETH 200-200-20 MG/5ML PO SUSP: 30.0000 mL | ORAL | Status: DC | PRN

## 2011-10-10 MED ORDER — MAGNESIUM HYDROXIDE 400 MG/5ML PO SUSP: 30.0000 mL | Freq: Every day | ORAL | Status: DC | PRN

## 2011-10-10 NOTE — ED Notes (Signed)
Patient presents visibly upset crying   Stating her man friend is seeing another woman that lives right in front of her.  Patient admits to having been drinking beer earlier.   Patient expresses disappoint with her life, " I never married, I don't have children, [place] [address],  I am poor,  I don't have no money."       Expressed anger against the woman her man friend is seeing. Express she would like to hurt her.   Patient expressed she wanted to die.  Stated she was going to kill herself someday. Indefinite about plan.

## 2011-10-10 NOTE — ED Notes (Signed)
Family left numbers to be called if needed  [phone]  Or [phone]

## 2011-10-10 NOTE — BH Assessment (Addendum)
Assessment Note   Jenna May is an African American 56 y.o. female presents to [facility] ED with her niece and nephew due to suicidal ideations. The patient reports that she has had thoughts of suicide "off and on for 14 years". The patient reports that she has suicidal thoughts due to feeling "alone without a husband, poor, living [place] [address], and no friends". The patient denies HI/AVH. The patient rreports that she has been thinking of "taking pills" or "stabbing self in stomach" as her suicidal plan. The patient reports that she is experiencing insomnia (4-6 hours of sleep); frequent crying and tearful during assessment; isolating self from others; she has moments of feeling fatigue; feelings of guilt; loss of interest in usual pleasures; self-pity; worthlessness; and irritability. The patient reports that all symptoms have been ongoing for a duration of longer than 1 month.  The patient reports that she smokes 7 cigarettes per day and she can drink between 1 beer up to 3 quartz of beer daily "depending on how she is feeling". The patient reports that she drinks more when she thinks about being unmarried.  The patient reports that she has not received mental health or substance abuse treatment in the past and she has no family history of mental health concerns.    At 1400 on 10/10/11 patient was accepted to [facility] hospital bed 503-1 Dr. Perlie Gold to Dr. Dan Humphreys. Counselor spoke with Dr. Rubin Payor (ED physician) to inform him of the patient's acceptance. Dr stated that the patient had been cleared for discharge.  Axis I: Depressive Disorder NOS Axis II: Deferred Axis III:  Past Medical History  Diagnosis Date  . Hypertension   . Tobacco abuse     Quit 2009, after approximately 15 pack year smoking history.  . Anxiety   . 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 Umbilical hernia     S/P repair approximately 1995  . Postmenopausal status   . Bartholin cyst  06/2003  . Normocytic anemia     Unclear etiology. Anemia panel in 03/2010 showing Iron 53 , TIBC 303 , iron saturation percentage 17, ferritin 197 , B12 and folate within normal limits.   Axis IV: economic problems, housing problems, problems related to social environment and problems with primary support group Axis V: 31-40 impairment in reality testing  Past Medical History:  Past Medical History  Diagnosis Date  . Hypertension   . Tobacco abuse     Quit 2009, after approximately 15 pack year smoking history.  . Anxiety   . 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 Umbilical hernia     S/P repair approximately 1995  . Postmenopausal status   . Bartholin cyst 06/2003  . Normocytic anemia     Unclear etiology. Anemia panel in 03/2010 showing Iron 53 , TIBC 303 , iron saturation percentage 17, ferritin 197 , B12 and folate within normal limits.    Past Surgical History  Procedure Date  . Umbilical hernia repair approximately 1995  . Appendectomy approximately 1991  . Total abdominal hysterectomy 03/1996    2/2 fibroids    Family History:  Family History  Problem Relation Age of Onset  . Hypertension Mother   . Diabetes Father   . Hypertension Father   . Hypertension Sister   . Heart attack Brother 13    Social History:  reports that she quit smoking about 3 years ago. Her  smoking use included Cigarettes. She has a 12 pack-year smoking history. She has never used smokeless tobacco. She reports that she does not drink alcohol or use illicit drugs.  Additional Social History:    Allergies: No Known Allergies  Home Medications:  No current facility-administered medications on file as of 10/10/2011.   Medications Prior to Admission  Medication Sig Dispense Refill  . amLODipine (NORVASC) 10 MG tablet Take 1 tablet (10 mg total) by mouth daily.  30 tablet  5  . hydrochlorothiazide (HYDRODIURIL) 25 MG tablet Take 1 tablet (25 mg total) by mouth  daily.  30 tablet  2  . loratadine (CLARITIN) 10 MG tablet Take 1 tablet (10 mg total) by mouth daily.  30 tablet  2  . pravastatin (PRAVACHOL) 40 MG tablet Take 1 tablet (40 mg total) by mouth every evening.  30 tablet  5  . triamcinolone ointment (KENALOG) 0.1 % Apply topically 2 (two) times daily.  80 g  0    OB/GYN Status:  Patient's last menstrual period was 09/29/1995.  General Assessment Data Location of Assessment: [facility] ED ACT Assessment: Yes Living Arrangements: Alone Can pt return to current living arrangement?: Yes Admission Status: Voluntary Is patient capable of signing voluntary admission?: Yes Transfer from: Acute Hospital Referral Source: Self/Family/Friend  Education Status Is patient currently in school?: No  Risk to self Suicidal Ideation: Yes-Currently Present Suicidal Intent: Yes-Currently Present Is patient at risk for suicide?: Yes Suicidal Plan?: Yes-Currently Present Specify Current Suicidal Plan:  (reported that she will take pills or stab self in stomach) Access to Means: Yes Specify Access to Suicidal Means:  (medication and knife) What has been your use of drugs/alcohol within the last 12 months?:  (beer) Previous Attempts/Gestures: No How many times?:  (na) Other Self Harm Risks:  (na) Triggers for Past Attempts:  (poverty) Intentional Self Injurious Behavior: None Family Suicide History: No Recent stressful life event(s): Financial Problems (living arrangements) Persecutory voices/beliefs?: No Depression: Yes Depression Symptoms: Insomnia;Tearfulness;Isolating;Fatigue;Guilt;Loss of interest in usual pleasures;Feeling worthless/self pity;Feeling angry/irritable Substance abuse history and/or treatment for substance abuse?: Yes Suicide prevention information given to non-admitted patients: Not applicable  Risk to Others Homicidal Ideation: No Thoughts of Harm to Others: No Current Homicidal Intent: No Current Homicidal Plan: No Access to  Homicidal Means: No Identified Victim:  (na) History of harm to others?: No Assessment of Violence: None Noted Violent Behavior Description:  (pt is not violent) Does patient have access to weapons?: Yes (Comment) Criminal Charges Pending?: No Does patient have a court date: No  Psychosis Hallucinations: None noted Delusions: None noted  Mental Status Report Appear/Hygiene:  (normal) Eye Contact: Fair Motor Activity:  (normal) Speech: Slurred Level of Consciousness: Alert Mood: Depressed Affect: Depressed Anxiety Level: Moderate Thought Processes: Coherent Judgement: Impaired Orientation: Person;Place;Time;Situation;Appropriate for developmental age Obsessive Compulsive Thoughts/Behaviors: Moderate  Cognitive Functioning Concentration: Normal Memory: Recent Intact;Remote Intact IQ: Average Insight: Poor Impulse Control: Poor Appetite: Fair Weight Loss:  (none reported) Weight Gain:  (none reported) Sleep: Decreased Total Hours of Sleep:  (4-6 hours) Vegetative Symptoms: None  Prior Inpatient Therapy Prior Inpatient Therapy: No  Prior Outpatient Therapy Prior Outpatient Therapy: No            Values / Beliefs Cultural Requests During Hospitalization: None Spiritual Requests During Hospitalization: None        Additional Information 1:1 In Past 12 Months?: No CIRT Risk: No Elopement Risk: No Does patient have medical clearance?: Yes     Disposition:  Disposition Disposition  of Patient: Referred to Patient referred to:  [facility])  On Site Evaluation by:   Reviewed with Physician:     Forrestine Him 10/10/2011 10:53 AM

## 2011-10-10 NOTE — ED Notes (Signed)
The pts  Left see numbers below.  Pt crying intermittently

## 2011-10-10 NOTE — ED Provider Notes (Signed)
Medical screening examination/treatment/procedure(s) were conducted as a shared visit with non-physician practitioner(s) and myself. I went to see patient and she was sleeping.  Joya Gaskins, MD 10/10/11 [phone]

## 2011-10-10 NOTE — Discharge Instructions (Signed)
Alcohol Problems  RESOURCE GUIDE  Dental Problems  Patients with Medicaid: [facility] [phone] [address]                                           [phone] [address] Phone:  [phone]                                                  Phone:  [phone]  If unable to pay or uninsured, contact:  [facility] or [facility]. to become qualified for the adult dental clinic.  Chronic Pain Problems Contact [facility]  [phone] Patients need to be referred by their primary care doctor.  Insufficient Money for Medicine Contact [employer] Way:  call "211" or [facility] [phone].  No Primary Care Doctor Call [facility]  [phone] Other agencies that provide inexpensive medical care    [facility]  [phone]    [facility]  [phone]    [facility]  [phone]    [facility]  [phone]    [facility]  [phone]    [facility]  [phone]  Psychological Services [facility]  [phone] [facility]  [phone] [facility]   [phone] (emergency services [phone])  Substance Abuse Resources [facility]  [phone] [facility] [phone] [facility] [phone] Floydene Flock [phone] Residential & Outpatient Substance Abuse Program  [phone]  Abuse/Neglect [facility] Child Abuse Hotline 830-744-1135 [facility] Child Abuse Hotline [phone] (After Hours)  Emergency Shelter [facility] [phone]  Maternity Homes [facility] [place] [facility] [phone] [facility] [phone]  MRSA Hotline #:   820-550-3805    [facility] Resources  [facility] [place]     United Way                          [facility] Dept. [address] [place]                        [address]      [address]  Blondell Reveal Phone:  [phone]                                   Phone:  [phone]                 Phone:  [phone]  [facility]  Phone:  [phone]  [facility] Child Abuse Hotline (718)[phone] (After Hours)   Most adults who drink alcohol drink in moderation (not a lot) are at low risk for developing problems related to their drinking. However, all drinkers, including low-risk drinkers, should know about the health risks connected with drinking alcohol. RECOMMENDATIONS FOR LOW-RISK DRINKING  Drink in moderation. Moderate drinking is defined as follows:   Men - no more than 2 drinks per day.   Nonpregnant women - no more than 1 drink per day.   Over age 23 - no more than 1 drink per day.  A standard drink is 12 grams of pure alcohol, which is equal to a 12 ounce bottle of beer or wine cooler, a 5 ounce glass of wine, or 1.5 ounces of distilled spirits (such as whiskey, brandy, vodka, or rum).  ABSTAIN FROM (DO NOT DRINK) ALCOHOL:  When pregnant or considering pregnancy.   When taking a medication that interacts with alcohol.   If you are alcohol dependent.   A medical condition that prohibits drinking alcohol (such as ulcer, liver disease, or heart disease).  DISCUSS WITH YOUR CAREGIVER:  If you are at risk for coronary heart disease, discuss the potential benefits and risks of alcohol use: Light to moderate drinking is associated with lower rates of coronary heart disease in certain populations (for example, men over age 37 and postmenopausal women). Infrequent or nondrinkers are advised not to begin light to moderate drinking to reduce the risk of coronary heart disease so as to avoid creating an alcohol-related problem. Similar protective effects can likely be gained through proper diet and exercise.   Women  and the elderly have smaller amounts of body water than men. As a result women and the elderly achieve a higher blood alcohol concentration after drinking the same amount of alcohol.   Exposing a fetus to alcohol can cause a broad range of birth defects referred to as Fetal Alcohol Syndrome (FAS) or Alcohol-Related Birth Defects (ARBD). Although FAS/ARBD is connected with excessive alcohol consumption during pregnancy, studies also have reported neurobehavioral problems in infants born to mothers reporting drinking an average of 1 drink per day during pregnancy.   Heavier drinking (the consumption of more than 4 drinks per occasion by men and more than 3 drinks per occasion by women) impairs learning (cognitive) and psychomotor functions and increases the risk of alcohol-related problems, including accidents and injuries.  CAGE QUESTIONS:   Have you ever felt that you should Cut down on your drinking?   Have people Annoyed you by criticizing your drinking?   Have you ever felt bad or Guilty about your drinking?   Have you ever had a drink first thing in the morning to steady your nerves or get rid of a hangover (Eye opener)?  If you answered positively to any of these questions: You may be at risk for alcohol-related problems if alcohol consumption is:   Men: Greater than 14 drinks per week or more than 4 drinks per occasion.   Women: Greater than 7 drinks per week or more than 3 drinks per occasion.  Do you or your family have a medical history of alcohol-related problems, such as:  Blackouts.   Sexual dysfunction.   Depression.   Trauma.   Liver dysfunction.   Sleep disorders.   Hypertension.   Chronic abdominal pain.   Has your drinking ever caused you problems, such as problems with your family, problems with your work (or school)  performance, or accidents/injuries?   Do you have a compulsion to drink or a preoccupation with drinking?   Do you have poor control or are you  unable to stop drinking once you have started?   Do you have to drink to avoid withdrawal symptoms?   Do you have problems with withdrawal such as tremors, nausea, sweats, or mood disturbances?   Does it take more alcohol than in the past to get you high?   Do you feel a strong urge to drink?   Do you change your plans so that you can have a drink?   Do you ever drink in the morning to relieve the shakes or a hangover?  If you have answered a number of the previous questions positively, it may be time for you to talk to your caregivers, family, and friends and see if they think you have a problem. Alcoholism is a chemical dependency that keeps getting worse and will eventually destroy your health and relationships. Many alcoholics end up dead, impoverished, or in prison. This is often the end result of all chemical dependency.  Do not be discouraged if you are not ready to take action immediately.   Decisions to change behavior often involve up and down desires to change and feeling like you cannot decide.   Try to think more seriously about your drinking behavior.   Think of the reasons to quit.  WHERE TO GO FOR ADDITIONAL INFORMATION   The National Institute on Alcohol Abuse and Alcoholism (NIAAA)www.niaaa.nih.gov   [employer] on Alcoholism and Drug Dependence (NCADD)www.ncadd.org   American Society of Addiction Medicine (ASAM)www.https://anderson-johnson.com/  Document Released: 07/13/2005 Document Revised: 07/02/2011 Document Reviewed: 02/29/2008 [facility] Patient Information 2012 [place], [place].

## 2011-10-10 NOTE — ED Provider Notes (Signed)
  Physical Exam  BP 125/75  Pulse 92  Temp(Src) 98.2 F (36.8 C) (Oral)  Resp 20  Ht 5\' 6"  (1.676 m)  SpO2 98%  LMP 09/29/1995  Physical Exam  ED Course  Procedures  MDM Patient came in with alcohol intoxication and suicidal statements. She is now sober and states that she did not mean what she said. Patient was seen by telemetry psychiatry clear. She'll be given resources for substance abuse treatment if needed      Juliet Rude. Rubin Payor, MD 10/10/11 [phone]

## 2011-10-10 NOTE — Progress Notes (Signed)
Pt is crying and sobbing and talking through tears. Pt stated that she is upset with her friend who live across the road from her because she thinks she is having a relationship with one of her friend who is married. This she stated upsets her because it is " not wright" pt stated. This pt stated in addition with  "other things" makes her feel like killing herself. At the moment pt stated that she does not have a plan. However,she does not know if she will "take some pills or stab herself ' She also stated that she is "poor and without a job and no one loves her". Pt also said that has not eaten today and that she is " very hungry" We promise to get her something to eat.Pt seems very sad at this time and hungry for someone to believe her story.

## 2011-10-10 NOTE — ED Notes (Signed)
Patient continues to cry loudly  Upset talking with sitter about her situation.

## 2011-10-10 NOTE — ED Notes (Signed)
The pt says her niece called the police because she was saying she was going to kill herself.  Crying in triage

## 2011-10-10 NOTE — ED Provider Notes (Signed)
History     CSN: [id number]  Arrival date & time 10/10/11  0407   First MD Initiated Contact with Patient 10/10/11 [phone]      Chief Complaint  Patient presents with  . Psychiatric Evaluation    HPI: Patient is a 56 y.o. female presenting with mental health disorder. The history is provided by the patient.  Mental Health Problem The current episode started today. This is a new problem.  The onset of the illness is precipitated by a stressful event, emotional stress and alcohol abuse. The degree of incapacity that she is experiencing as a consequence of her illness is mild. She admits to suicidal ideas. She does have a plan to commit suicide. She contemplates harming herself. She has not already injured self. She contemplates injuring another person. She has not already  injured another person. Risk factors that are present for mental illness include a history of mental illness and substance abuse.   patient reports that she's had suicidal ideations for the last couple of days. Admits that she has entertained plan of taking an overdose of pills or cutting herself. States that she's been very stressed over her situation with her relationship. Although she is very vague she states "someone I know is messing with someone I know and I don't like it". The patient denies HI but she states "If Ididn't live in section 8 housing I would probably be fighting". Denies any recent illnesses or current physical complaints. Patient admits that she had a beer tonight but denies illicit drug use or medications.  Past Medical History  Diagnosis Date  . Hypertension   . Tobacco abuse     Quit 2009, after approximately 15 pack year smoking history.  . Anxiety   . 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 Umbilical hernia     S/P repair approximately 1995  . Postmenopausal status   . Bartholin cyst 06/2003  . Normocytic anemia     Unclear etiology. Anemia panel in 03/2010 showing  Iron 53 , TIBC 303 , iron saturation percentage 17, ferritin 197 , B12 and folate within normal limits.    Past Surgical History  Procedure Date  . Umbilical hernia repair approximately 1995  . Appendectomy approximately 1991  . Total abdominal hysterectomy 03/1996    2/2 fibroids    Family History  Problem Relation Age of Onset  . Hypertension Mother   . Diabetes Father   . Hypertension Father   . Hypertension Sister   . Heart attack Brother 17    History  Substance Use Topics  . Smoking status: Former Smoker -- 0.4 packs/day for 30 years    Types: Cigarettes    Quit date: 04/11/2008  . Smokeless tobacco: Never Used  . Alcohol Use: No    OB History    Grav Para Term Preterm Abortions TAB SAB Ect Mult Living                  Review of Systems  Constitutional: Negative.   HENT: Negative.   Eyes: Negative.   Respiratory: Negative.   Cardiovascular: Negative.   Gastrointestinal: Negative.   Genitourinary: Negative.   Musculoskeletal: Negative.   Skin: Negative.   Neurological: Negative.   Hematological: Negative.   Psychiatric/Behavioral: Negative.     Allergies  Review of patient's allergies indicates no known allergies.  Home Medications   Current Outpatient Rx  Name Route Sig Dispense Refill  . AMLODIPINE BESYLATE 10  MG PO TABS Oral Take 1 tablet (10 mg total) by mouth daily. 30 tablet 5  . HYDROCHLOROTHIAZIDE 25 MG PO TABS Oral Take 1 tablet (25 mg total) by mouth daily. 30 tablet 2  . LORATADINE 10 MG PO TABS Oral Take 1 tablet (10 mg total) by mouth daily. 30 tablet 2  . PRAVASTATIN SODIUM 40 MG PO TABS Oral Take 1 tablet (40 mg total) by mouth every evening. 30 tablet 5  . TRIAMCINOLONE ACETONIDE 0.1 % EX OINT Topical Apply topically 2 (two) times daily. 80 g 0    BP 154/91  Pulse 105  Temp(Src) 97.7 F (36.5 C) (Oral)  Resp 20  SpO2 100%  LMP 09/29/1995  Physical Exam  Constitutional: She is oriented to person, place, and time. She appears  well-developed and well-nourished.  HENT:  Head: Normocephalic and atraumatic.  Eyes: Conjunctivae are normal.  Neck: Neck supple.  Cardiovascular: Normal rate and regular rhythm.   Pulmonary/Chest: Effort normal and breath sounds normal.  Abdominal: Soft. Bowel sounds are normal.  Musculoskeletal: Normal range of motion.  Neurological: She is alert and oriented to person, place, and time.  Skin: Skin is warm and dry. No erythema.  Psychiatric: Her speech is normal and behavior is normal. She exhibits a depressed mood. She expresses suicidal ideation. She expresses suicidal plans. She expresses no homicidal plans.       Patient is tearful at times during interview.    ED Course  Procedures   I have discussed patient with "Marchelle Folks" w/ Act Team who has as agreed to evaluate patient. I have discussed patient with Dr. Bebe Shaggy who is aware of plan.  Labs Reviewed  URINALYSIS, ROUTINE W REFLEX MICROSCOPIC - Abnormal; Notable for the following:    Color, Urine STRAW (*)    Specific Gravity, Urine <1.005 (*)    Hgb urine dipstick TRACE (*)    All other components within normal limits  COMPREHENSIVE METABOLIC PANEL - Abnormal; Notable for the following:    Potassium 3.2 (*)    Glucose, Bld 131 (*)    Creatinine, Ser 0.46 (*)    Total Protein 9.2 (*)    All other components within normal limits  ETHANOL - Abnormal; Notable for the following:    Alcohol, Ethyl (B) 293 (*)    All other components within normal limits  URINE MICROSCOPIC-ADD ON - Abnormal; Notable for the following:    Squamous Epithelial / LPF FEW (*)    All other components within normal limits  CBC  DIFFERENTIAL  URINE RAPID DRUG SCREEN (HOSP PERFORMED)   No results found.   No diagnosis found.    MDM  HPI/PE and clinical findings c/w 1. Suicidal ideations 2. Depression 3. ETOH abuse  Act Team eval pending        Leanne Chang, NP 10/10/11 [phone]

## 2011-10-10 NOTE — ED Notes (Signed)
The pt has been undressed and placed in blue scrubs house coverage notified of pt.  Will wand by security

## 2011-11-12 ENCOUNTER — Ambulatory Visit (INDEPENDENT_AMBULATORY_CARE_PROVIDER_SITE_OTHER): Payer: Self-pay | Admitting: Internal Medicine

## 2011-11-12 ENCOUNTER — Encounter: Payer: Self-pay | Admitting: Internal Medicine

## 2011-11-12 VITALS — BP 138/86 | HR 72 | Temp 97.5°F | Ht 66.0 in | Wt 184.2 lb

## 2011-11-12 DIAGNOSIS — I10 Essential (primary) hypertension: Secondary | ICD-10-CM

## 2011-11-12 DIAGNOSIS — L259 Unspecified contact dermatitis, unspecified cause: Secondary | ICD-10-CM

## 2011-11-12 DIAGNOSIS — L309 Dermatitis, unspecified: Secondary | ICD-10-CM

## 2011-11-12 DIAGNOSIS — F411 Generalized anxiety disorder: Secondary | ICD-10-CM

## 2011-11-12 DIAGNOSIS — E785 Hyperlipidemia, unspecified: Secondary | ICD-10-CM

## 2011-11-12 LAB — LIPID PANEL
Cholesterol: 234 mg/dL — ABNORMAL HIGH (ref 0–200)
HDL: 36 mg/dL — ABNORMAL LOW (ref >39)
Triglycerides: 958 mg/dL — ABNORMAL HIGH (ref <150)

## 2011-11-12 MED ORDER — LORATADINE 10 MG PO TABS: 10.0000 mg | ORAL_TABLET | Freq: Every day | ORAL | Status: DC

## 2011-11-12 MED ORDER — TRIAMCINOLONE ACETONIDE 0.1 % EX OINT: TOPICAL_OINTMENT | Freq: Two times a day (BID) | CUTANEOUS | Status: DC

## 2011-11-12 MED ORDER — HYDROCHLOROTHIAZIDE 25 MG PO TABS: 25.0000 mg | ORAL_TABLET | Freq: Every day | ORAL | Status: DC

## 2011-11-12 NOTE — Progress Notes (Signed)
Subjective:    Patient ID: Jenna May female   DOB: [birth year] 56 y.o.   MRN: [id number]  HPI: Jenna May is a 56 y.o. with a PMHx of HTN, anxiety, normocytic anemia who presented to clinic today for the following:  1) Eczema - Pt was diagnosed with eczema at the beginning of October 2012, at which time she was started on Triamcinolone cream and Zyrtec, then changed to Claritin on Oct 19 because of persistent pruritis. At this point she is having new eruptions despite using the triamcinolone 2-3 times a day. She is having more raised and continued pruri  She is also taking an over the counter tea for her eczema.  Denies fevers, chills, nausea, vomiting, diarrhea. No recent tick or mosquito bites.   2) HLD - currently supposed to be taking Pravastatin, has been off of it since 07/2011, because she wasn't sure if it was making her eczema worse. Is using an Ulong tea to see if she can bring it down. denies chest pain, difficulty breathing, palpitations, tachycardia, and muscle pains. Is fasting today.   3) HTN - Patient does not check blood pressure regularly at home. Currently taking amlodipine (Norvase) and hydrochlorothiazide (HCTZ). Patient misses doses 0 x per week on average. denies headaches, dizziness, lightheadedness, chest pain, shortness of breath.   4) Depression/ Anxiety - is not currently on therapy. She recently was seen in the ER in 09/2011 at which time she was at an extreme point of anxiety and had apparently expressed suicidal ideations and was held for behavioral assessment. Ms. Elias is very hesitant to discuss this, explaining, sometimes people get overwhelmed, and she did not intend to hurt herself or others. She will not elaborate further, but indicates that she is feeling well now, denies suicidal ideation, homicidal ideation. She did not follow-up with [facility] as recommended.   Review of Systems: Per HPI.   Current Outpatient Medications: Medication Sig  .  amLODipine (NORVASC) 10 MG tablet Take 1 tablet (10 mg total) by mouth daily.  . hydrochlorothiazide (HYDRODIURIL) 25 MG tablet Take 1 tablet (25 mg total) by mouth daily.  Marland Kitchen loratadine (CLARITIN) 10 MG tablet Take 1 tablet (10 mg total) by mouth daily.  Marland Kitchen triamcinolone ointment (KENALOG) 0.1 % Apply topically 2 (two) times daily.    Allergies: No Known Allergies   Past Medical History  Diagnosis Date  . Hypertension   . Tobacco abuse     Quit 2009, after approximately 15 pack year smoking history.  . Anxiety   . 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 Umbilical hernia     S/P repair approximately 1995  . Postmenopausal status   . Bartholin cyst 06/2003  . Normocytic anemia     Unclear etiology. Anemia panel in 03/2010 showing Iron 53 , TIBC 303 , iron saturation percentage 17, ferritin 197 , B12 and folate within normal limits.    Past Surgical History  Procedure Date  . Umbilical hernia repair approximately 1995  . Appendectomy approximately 1991  . Total abdominal hysterectomy 03/1996    2/2 fibroids     Objective:    Physical Exam: Filed Vitals:   11/12/11 1605  BP: 138/86  Pulse: 72  Temp: 97.5 F (36.4 C)     General: Vital signs reviewed and noted. Well-developed, well-nourished, in no acute distress; alert, appropriate and cooperative throughout examination.  Head: Normocephalic, atraumatic.  Lungs:  Normal respiratory effort. Clear to auscultation BL without  crackles or wheezes.  Heart: RRR. S1 and S2 normal without gallop, murmur, or rubs.  Abdomen:  BS normoactive. Soft, Nondistended, non-tender.  No masses or organomegaly.  Extremities: No pretibial edema.  Skin: Hyperpigmented healed scars on forearms and legs from prior rash. No acute rashes appreciated.    Assessment/ Plan:   Case and plan of care discussed with Dr. Lina Sayre.

## 2011-11-12 NOTE — Patient Instructions (Signed)
   Please follow-up at the clinic in 3 months, at which time we will reevaluate your rash, blood pressure - OR, please follow-up in the clinic sooner if needed.  There have been changes in your medications:  STOP your pravastatin.   I am sending you a dermatology referral. They will call you with an appointment.  I am checking labs, if abnormal, I will call you.  If symptoms worsen, or new symptoms arise, please call the clinic or go to the ER.  Please bring all of your medications in a bag to your next visit.

## 2011-11-13 NOTE — Progress Notes (Signed)
Quick Note:  Will plan to repeat next visit, because this is very high (trigs), will add direct LDL today.  Johnette Abraham, D.O. ______

## 2011-11-14 NOTE — Assessment & Plan Note (Signed)
Assessment:  Pt is currently not taking any medications. Had recent ER visit for SI, but was cleared by [facility] for outpatient follow-up that she did not complete. She states that that episode was isolated, and does not want to talk further about this. Denies SI/HI.   Plan:  Patient refuses help with counseling, referral to [facility] at this time.  States her symptoms are stabilized and have resolved.  Instructed to call clinic or go to ER if symptoms of SI/HI again arise.

## 2011-11-14 NOTE — Assessment & Plan Note (Signed)
Pertinent Labs: Liver Function Tests:    Component Value Date/Time   AST 15 10/10/2011 0425   ALT 28 10/10/2011 0425   ALKPHOS 71 10/10/2011 0425   BILITOT 0.3 10/10/2011 0425   PROT 9.2* 10/10/2011 0425   ALBUMIN 4.6 10/10/2011 0425   Lipid Panel:   Ref. Range 11/06/2010 14:26  Cholesterol Latest Range: 0-200 mg/dL 454 (H)  Triglycerides Latest Range: <150 mg/dL 38  HDL Latest Range: >09 mg/dL 36 (L)  LDL (calc) Latest Range: 0-99 mg/dL 811 (H)  VLDL Latest Range: 0-40 mg/dL 8  Total CHOL/HDL Ratio No range found 7.4    Assessment: Disease Control: not controlled  Progress toward goals: unable to assess  Barriers to meeting goals: nonadherence to medications    She also indicates off of statin since January, because not sure if it is making her eczema worse. Has had prolonged history of noncompliance with the statin for varying reasons.  Patient is noncompliant much of the time with prescribed medications.    Plan:  Stop statin  Recheck FLP  If needed, consider change to different statin (avoid pravastatin).

## 2011-11-14 NOTE — Assessment & Plan Note (Signed)
Assessment:  Patient as had ongoing symptoms despite use of triamcinolone cream. I suspect this is also exacerbated in the context of the seasonal allergies.   These are well controlled with triamcinolone and claritin when she uses them, she is just concerned that she keeps getting lesions.  Plan:      Continue triamcinolone and Claritin for now (she has previously found Claritin to be more effective than Zyrtec).  Will refer to dermatology for continued management, with consideration for allergy testing.

## 2011-11-14 NOTE — Assessment & Plan Note (Signed)
Pertinent Data: BP Readings from Last 3 Encounters:  11/12/11 138/86  10/10/11 125/75  08/18/11 129/76    Basic Metabolic Panel:    Component Value Date/Time   NA 137 10/10/2011 0425   K 3.2* 10/10/2011 0425   CL 97 10/10/2011 0425   CO2 24 10/10/2011 0425   BUN 12 10/10/2011 0425   CREATININE 0.46* 10/10/2011 0425   CREATININE 0.65 11/06/2010 1426   GLUCOSE 131* 10/10/2011 0425   CALCIUM 10.3 10/10/2011 0425    Assessment: Disease Control: controlled  Progress toward goals: at goal  Barriers to meeting goals: no barriers identified    Patient is compliant all of the time with prescribed medications.   Plan:  continue current medications

## 2011-12-30 ENCOUNTER — Emergency Department (INDEPENDENT_AMBULATORY_CARE_PROVIDER_SITE_OTHER)
Admission: EM | Admit: 2011-12-30 | Discharge: 2011-12-30 | Disposition: A | Discharge status: Home / Self Care | Payer: Self-pay | Source: Home / Self Care | Attending: Family Medicine | Admitting: Family Medicine

## 2011-12-30 ENCOUNTER — Encounter (HOSPITAL_COMMUNITY): Payer: Self-pay | Admitting: *Deleted

## 2011-12-30 DIAGNOSIS — M19079 Primary osteoarthritis, unspecified ankle and foot: Secondary | ICD-10-CM

## 2011-12-30 LAB — CBC
Hemoglobin: 10.3 g/dL — ABNORMAL LOW (ref 12.0–15.0)
MCH: 28.1 pg (ref 26.0–34.0)
MCHC: 32.4 g/dL (ref 30.0–36.0)
MCV: 86.6 fL (ref 78.0–100.0)
Platelets: 340 10*3/uL (ref 150–400)

## 2011-12-30 LAB — URIC ACID: Uric Acid, Serum: 9.6 mg/dL — ABNORMAL HIGH (ref 2.4–7.0)

## 2011-12-30 MED ORDER — INDOMETHACIN 50 MG PO CAPS: 50.0000 mg | ORAL_CAPSULE | Freq: Two times a day (BID) | ORAL | Status: DC

## 2011-12-30 MED ORDER — PREDNISONE 20 MG PO TABS: ORAL_TABLET | ORAL | Status: AC

## 2011-12-30 MED ORDER — TRAMADOL HCL 50 MG PO TABS: 50.0000 mg | ORAL_TABLET | Freq: Four times a day (QID) | ORAL | Status: AC | PRN

## 2011-12-30 NOTE — Discharge Instructions (Signed)
History is that your hydrochlorothiazide (HCTZ) pill triggers pain and swelling in your feet. This is very suggestive of gout. I still don't have the results of your bloodwork.  I recommend that you caught in half your HCTZ by mouth and take it once a day until you followup with your primary care doctor. You make an appointment next week your primary doctor would have the results of your blood work today. If any panic emergent abnormal result we will contact you. I recommend that you take the prescribed medications as instructed.  Be aware that tramadol can make you drowsy and he should not drive after taking this medication. Also as we discussed take Indocin with food as it can upset your stomach. You can take over-the-counter Prilosec daily while you're taking this medication to protect her stomach. Return if worsening symptoms like increased pain redness and swelling despite following treatment otherwise followup with your primary care provider next week.

## 2011-12-30 NOTE — ED Notes (Signed)
Pt reports 12/20/2011 she noticed pain in both ankles.  The next day the left foot and ankle started swelling and she felt slight fever and chills  She denies  Recent injury.   Redness and swelling noted left foot and ankle

## 2011-12-31 ENCOUNTER — Other Ambulatory Visit: Payer: Self-pay | Admitting: *Deleted

## 2011-12-31 DIAGNOSIS — I10 Essential (primary) hypertension: Secondary | ICD-10-CM

## 2011-12-31 MED ORDER — HYDROCHLOROTHIAZIDE 25 MG PO TABS: 25.0000 mg | ORAL_TABLET | Freq: Every day | ORAL | Status: DC

## 2011-12-31 MED ORDER — AMLODIPINE BESYLATE 10 MG PO TABS: 10.0000 mg | ORAL_TABLET | Freq: Every day | ORAL | Status: DC

## 2011-12-31 NOTE — Telephone Encounter (Signed)
Pt only wants norvasc filled at [facility]. I called HCTZ into [facility] and cancelled at [facility].

## 2012-01-01 ENCOUNTER — Other Ambulatory Visit: Payer: Self-pay | Admitting: *Deleted

## 2012-01-01 DIAGNOSIS — I10 Essential (primary) hypertension: Secondary | ICD-10-CM

## 2012-01-01 MED ORDER — AMLODIPINE BESYLATE 10 MG PO TABS: 10.0000 mg | ORAL_TABLET | Freq: Every day | ORAL | Status: DC

## 2012-01-01 NOTE — Telephone Encounter (Signed)
Sounds good. Thank you.  Johnette Abraham, D.O.

## 2012-01-03 ENCOUNTER — Telehealth (HOSPITAL_COMMUNITY): Payer: Self-pay | Admitting: *Deleted

## 2012-01-03 NOTE — ED Provider Notes (Signed)
History     CSN: [id number]  Arrival date & time 12/30/11  1131   First MD Initiated Contact with Patient 12/30/11 1302      Chief Complaint  Patient presents with  . Foot Pain    (Consider location/radiation/quality/duration/timing/severity/associated sxs/prior treatment) HPI Comments: 56 y/o female with h/o HTN here c/o a flare up of her recurrent foot swelling and pain. States she usually has right foot swelling and pain and her PCP has performed a needle aspiration of her right 1st MP joint and was negative for gout in the past . States both feet have been swollen and painful during last week but symptoms improved in the right foot and are persistent in her left foot. She takes HCTZ 25 mg and has noted every time she is back on this medication she has a flare up of her foot swelling and pain. Denies involvement of any other joints. Has taken "goody powders" inconsistently for pain with no significant improvement.    Past Medical History  Diagnosis Date  . Hypertension   . Tobacco abuse     Quit 2009, after approximately 15 pack year smoking history.  . Anxiety   . 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 Umbilical hernia     S/P repair approximately 1995  . Postmenopausal status   . Bartholin cyst 06/2003  . Normocytic anemia     Unclear etiology. Anemia panel in 03/2010 showing Iron 53 , TIBC 303 , iron saturation percentage 17, ferritin 197 , B12 and folate within normal limits.    Past Surgical History  Procedure Date  . Umbilical hernia repair approximately 1995  . Appendectomy approximately 1991  . Total abdominal hysterectomy 03/1996    2/2 fibroids    Family History  Problem Relation Age of Onset  . Hypertension Mother   . Diabetes Father   . Hypertension Father   . Hypertension Sister   . Heart attack Brother 9    History  Substance Use Topics  . Smoking status: Former Smoker -- 0.4 packs/day for 30 years    Types:  Cigarettes    Quit date: 04/11/2008  . Smokeless tobacco: Never Used  . Alcohol Use: No    OB History    Grav Para Term Preterm Abortions TAB SAB Ect Mult Living                  Review of Systems  Constitutional: Negative for fever, chills and fatigue.       10 systems reviewed and  pertinent negative and positive symptoms are as per HPI.     Cardiovascular: Negative for chest pain and palpitations.  Gastrointestinal: Negative for abdominal pain.  Musculoskeletal: Positive for arthralgias.       As per HPI  Neurological: Negative for dizziness and light-headedness.  All other systems reviewed and are negative.    Allergies  Review of patient's allergies indicates no known allergies.  Home Medications   Current Outpatient Rx  Name Route Sig Dispense Refill  . LORATADINE 10 MG PO TABS Oral Take 1 tablet (10 mg total) by mouth daily. 30 tablet 2  . TRIAMCINOLONE ACETONIDE 0.1 % EX OINT Topical Apply topically 2 (two) times daily. 80 g 0  . AMLODIPINE BESYLATE 10 MG PO TABS Oral Take 1 tablet (10 mg total) by mouth daily. 30 tablet 11  . HYDROCHLOROTHIAZIDE 25 MG PO TABS Oral Take 1 tablet (25 mg total) by mouth daily.  30 tablet 11  . INDOMETHACIN 50 MG PO CAPS Oral Take 1 capsule (50 mg total) by mouth 2 (two) times daily with a meal. 20 capsule 0  . PREDNISONE 20 MG PO TABS  2 tabs po daily for 5 days 10 tablet 0  . TRAMADOL HCL 50 MG PO TABS Oral Take 1 tablet (50 mg total) by mouth every 6 (six) hours as needed for pain. 15 tablet 0    BP 128/67  Pulse 76  Temp(Src) 98.5 F (36.9 C) (Oral)  Resp 18  SpO2 100%  LMP 09/29/1995  Physical Exam  Nursing note and vitals reviewed. Constitutional: She is oriented to person, place, and time. She appears well-developed and well-nourished. No distress.  HENT:  Head: Normocephalic and atraumatic.  Eyes: Conjunctivae are normal. Pupils are equal, round, and reactive to light. No scleral icterus.  Neck: No JVD present. No  thyromegaly present.  Cardiovascular: Normal rate, regular rhythm and normal heart sounds.   Pulmonary/Chest: Breath sounds normal.  Musculoskeletal:       Left foot: mild to moderate swelling of dorsum of foot also involving 1st MP. Impress warm but not hot. Very tender to palpation. No significant peri malleolar swelling. Skin appears intact with no erythema, skin thickening, no pustules or abrasions.   Neurological: She is alert and oriented to person, place, and time.  Skin: No rash noted.    ED Course  Procedures (including critical care time)  Labs Reviewed  CBC - Abnormal; Notable for the following:    RBC 3.67 (*)    Hemoglobin 10.3 (*)    HCT 31.8 (*)    All other components within normal limits  URIC ACID - Abnormal; Notable for the following:    Uric Acid, Serum 9.6 (*)    All other components within normal limits  SEDIMENTATION RATE - Abnormal; Notable for the following:    Sed Rate 113 (*)    All other components within normal limits  LAB REPORT - SCANNED   No results found.   1. Arthritis of ankle or foot, left       MDM  Impress non infectious gout related arthritis. Treated with prednisone.indomethacin, tramadol. omeprazole otc.   Asked to reduce HCTZ to 12.5mg  daily until seen by PCP next week.  Cbc, sed rate and uric acid pending at time of discharge.  Follow up with pcp as scheduled. Or return earlier if worsening redness, pain or swelling despite following treatment.        Sharin Grave, MD 01/03/12 [phone]

## 2012-01-03 NOTE — ED Notes (Signed)
Sed rate 113, Uric Acid 9.6.  Instructed to make pt aware of lab results by Dr. Alfonse Ras and to make sure pt is going to follow up with her PCP. Pt called and made aware, has appointment with PCP 6/27.

## 2012-01-21 ENCOUNTER — Other Ambulatory Visit: Payer: Self-pay | Admitting: Internal Medicine

## 2012-01-21 ENCOUNTER — Ambulatory Visit (INDEPENDENT_AMBULATORY_CARE_PROVIDER_SITE_OTHER): Payer: Self-pay | Admitting: Internal Medicine

## 2012-01-21 ENCOUNTER — Encounter: Payer: Self-pay | Admitting: Internal Medicine

## 2012-01-21 VITALS — BP 152/83 | HR 81 | Temp 98.9°F | Ht 66.0 in | Wt 184.8 lb

## 2012-01-21 DIAGNOSIS — L309 Dermatitis, unspecified: Secondary | ICD-10-CM

## 2012-01-21 DIAGNOSIS — I10 Essential (primary) hypertension: Secondary | ICD-10-CM

## 2012-01-21 DIAGNOSIS — M109 Gout, unspecified: Secondary | ICD-10-CM

## 2012-01-21 DIAGNOSIS — E781 Pure hyperglyceridemia: Secondary | ICD-10-CM

## 2012-01-21 DIAGNOSIS — E785 Hyperlipidemia, unspecified: Secondary | ICD-10-CM

## 2012-01-21 DIAGNOSIS — L2089 Other atopic dermatitis: Secondary | ICD-10-CM

## 2012-01-21 LAB — LIPID PANEL
HDL: 42 mg/dL (ref >39)
Triglycerides: 634 mg/dL — ABNORMAL HIGH (ref <150)

## 2012-01-21 MED ORDER — METOPROLOL TARTRATE 25 MG PO TABS: 25.0000 mg | ORAL_TABLET | Freq: Two times a day (BID) | ORAL | Status: DC

## 2012-01-21 MED ORDER — INDOMETHACIN 50 MG PO CAPS: 50.0000 mg | ORAL_CAPSULE | Freq: Three times a day (TID) | ORAL | Status: DC

## 2012-01-21 NOTE — Patient Instructions (Signed)
   Please follow-up at the clinic in 1 month, at which time we will reevaluate your gout flare - OR, please follow-up in the clinic sooner if needed.  There have been changes in your medications:  STOP hydrochlorothiazide  START metoprolol 25 mg twice daily for blood pressure.  START indomethacin three times daily for up to 2 weeks for your gout.    You are getting labs today, if they are abnormal I will give you a call.  If you have been started on new medication(s), and you develop symptoms concerning for allergic reaction, including, but not limited to, throat closing, tongue swelling, rash, please stop the medication immediately and call the clinic at [phone], and go to the ER.  If symptoms worsen, or new symptoms arise, please call the clinic or go to the ER.  Please bring all of your medications in a bag to your next visit.

## 2012-01-21 NOTE — Assessment & Plan Note (Signed)
Pertinent Labs: Liver Function Tests:    Component Value Date/Time   AST 15 10/10/2011 0425   ALT 28 10/10/2011 0425   ALKPHOS 71 10/10/2011 0425   BILITOT 0.3 10/10/2011 0425   PROT 9.2* 10/10/2011 0425   ALBUMIN 4.6 10/10/2011 0425    Lipid Panel:     Component Value Date/Time   CHOL 234* 11/12/2011 1635   TRIG 958* 11/12/2011 1635   HDL 36* 11/12/2011 1635   CHOLHDL 6.5 11/12/2011 1635   VLDL NOT CALC 11/12/2011 1635   LDLCALC NOT CALC 11/12/2011 1635     Assessment: Disease Control: not controlled  Progress toward goals: deteriorated  Barriers to meeting goals: Has had prior intolerance to simvastatin, feeling that she is having worsening of her eczema on this medication. Has previously been resistant to start medications.    Her last lipid panel in April 2013 showed significantly elevated triglycerides of greater than 900 and, the patient stated that she was fasting at that time, however I suspect that she was not actually fasting as this significant elevation of triglycerides was very much inconsistent with prior lab results..   Patient is not fasting today - a piece of fish and some Jell-O this morning.  Plan:  Recheck lipid panel today.

## 2012-01-21 NOTE — Assessment & Plan Note (Addendum)
Assessment:  The patient was referred to dermatology (Dr. Terri Piedra) - appt on 06/17 with biopsies at that time showing focal scale and parakeratosis overlying epidermis containing occasional necrotic keratinocytes. Findings most concerning for arthropod bite (possible scabies) versus drug reaction - such as HCTZ.  Plan:      Continue Clobetasol cream, and received permethrin cream per derm.

## 2012-01-21 NOTE — Assessment & Plan Note (Signed)
Pertinent Data: BP Readings from Last 3 Encounters:  01/21/12 152/83  12/30/11 128/67  11/12/11 138/86    Basic Metabolic Panel:    Component Value Date/Time   NA 137 10/10/2011 0425   K 3.2* 10/10/2011 0425   CL 97 10/10/2011 0425   CO2 24 10/10/2011 0425   BUN 12 10/10/2011 0425   CREATININE 0.46* 10/10/2011 0425   CREATININE 0.65 11/06/2010 1426   GLUCOSE 131* 10/10/2011 0425   CALCIUM 10.3 10/10/2011 0425    Assessment: Disease Control: not controlled  Progress toward goals: deteriorated  Barriers to meeting goals: no barriers identified, recently was decreased of her hydrochlorothiazide from 25 mg to 12.5 mg daily in the setting of acute gout flare.     Recheck blood pressure was also elevated..    Patient is compliant all of the time with prescribed medications.   Plan:  Continue amlodipine at current dosage.  Discontinue hydrochlorothiazide, with concern that it could be contributing towards gouty flare.   Start metoprolol 25 mg twice daily.   Continue to avoid ACE inhibitors and thiazide diuretics at present for fear of worsening acute gouty flares.

## 2012-01-21 NOTE — Progress Notes (Addendum)
Subjective:    Patient ID: Jenna May   Gender: female   DOB: 03-12-1956   Age: 56 y.o.   MRN: [id number]  HPI: [name] is a 56 y.o. with a HTN, anxiety, normocytic anemia who presented to clinic today for the following:  1) Urgent Care followup - Patient evaluated at [facility] on December 30, 2011 for evaluation of swelling of left leg and ankle swelling x 6 weeks in addition to hyperpigmentation, which was determined to be secondary to acute gout flare. Was treated with indomethacin x 10 days, prednisone 5mg  x 5 days. Uric acid level was 9.6, she is not on chronic therapy. Last time she had an issue was in 03/2009, at which time she was having pain, swelling in bilateral feet, had right toe tapped, but no crystals were noted at that time per pt report. Since being treated at urgent care, the patient's symptoms have improved, specifically,  swelling and pain improving, but pain is still 7/10. Also has been taking the hydrochlorothiazide 25 mg at half dose as recommended.  When she started having symptoms, had some subjective fevers, chills. Now, no longer having fever, chills, redness, no open lesions or sores. No recent trauma, falls.    2) HTN - Patient does not check blood pressure regularly at home. Currently taking Amldopine 10mg  daily and HCTZ 12.5 mg. Patient misses doses 0 x per week on average. denies headaches, dizziness, lightheadedness, chest pain, shortness of breath. does not request refills today.  3) Eczema - Pt was diagnosed with eczema at the beginning of October 2012, at which time she was started on Triamcinolone cream and Zyrtec, then changed to Claritin on Oct 19 because of persistent pruritis. She was referred to dermatology because despite treatment with triamcinolone and claritin, she continued to have eruption of lesions. She was told that either medication reaction or possible arthropod infection. Was started on Clobetasol cream and triamcinolone  cream. Breakouts decreased, but still occuring every few weeks. Denies fevers, chills, nausea, vomiting, diarrhea. No recent tick or mosquito bites.   4) HLD - currently not on medication. Previously was recommended simvastatin, but has not taken consistently because she felt like it was worsening her eczema. During last visit in April 2013, fasting lipid panel was obtained, the patient stated she was fasting, however LDL cholesterol could not be calculated because triglycerides were still elevated at greater than 900 mg/dL, which is not consistent with any of her prior readings. There was a plan to repeat her lipid panel. denies chest pain, difficulty breathing, palpitations, tachycardia, and muscle pains.     Review of Systems: Per HPI.    Current Outpatient Medications: Medication Sig  . amLODipine (NORVASC) 10 MG tablet Take 1 tablet (10 mg total) by mouth daily.  . clobetasol cream (TEMOVATE) 0.05 % Apply 1 application topically 2 (two) times daily.  . hydrochlorothiazide (HYDRODIURIL) 25 MG tablet Take 12.5 mg by mouth daily.  Marland Kitchen loratadine (CLARITIN) 10 MG tablet Take 1 tablet (10 mg total) by mouth daily.  Marland Kitchen triamcinolone ointment (KENALOG) 0.1 % Apply topically 2 (two) times daily.    Allergies: No Known Allergies  Past Medical History  Diagnosis Date  . Hypertension   . Tobacco abuse     Quit 2009, after approximately 15 pack year smoking history.  . Anxiety   . 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 Umbilical hernia     S/P repair approximately 1995  .  Postmenopausal status   . Bartholin cyst 06/2003  . Normocytic anemia     Unclear etiology. Anemia panel in 03/2010 showing Iron 53 , TIBC 303 , iron saturation percentage 17, ferritin 197 , B12 and folate within normal limits.    Past Surgical History  Procedure Date  . Umbilical hernia repair approximately 1995  . Appendectomy approximately 1991  . Total abdominal hysterectomy 03/1996      2/2 fibroids   Objective:    Physical Exam: Filed Vitals:   01/21/12 1325  BP: 152/83  Pulse: 81  Temp: 98.9 F (37.2 C)     General: Vital signs reviewed and noted. Well-developed, well-nourished, in no acute distress; alert, appropriate and cooperative throughout examination.  Head: Normocephalic, atraumatic.  Lungs:  Normal respiratory effort. Clear to auscultation BL without crackles or wheezes.  Heart: RRR. S1 and S2 normal without gallop, murmur, or rubs.  Abdomen:  BS normoactive. Soft, Nondistended, non-tender.  No masses or organomegaly.  Extremities: No pretibial edema. Left pedal edema with tenderness to palpation over the first MTP. Mild increased swelling and darkening the skin color over the first MTP. No open lesions noted. Sensations intact in the bilateral lower extremities. Muscle strength 5 out of 5 in the feet bilaterally.   Skin: No acute rashes appreciated.      Assessment/ Plan:    Case and plan of care discussed with Dr. Blanch Media.

## 2012-01-21 NOTE — Assessment & Plan Note (Signed)
Assessment: Patient's symptoms of left foot, predominantly distal foot swelling, tenderness with even sheets touching her foot, swelling over the first MTP is most consistent with acute gouty flare. She was previously on hydrochlorothiazide, and when seen at the urgent care for similar condition on 12/30/2011, she was treated with both indomethacin and prednisone. At possible that patient had recurrent flare after prednisone completion.  Plan:      Indomethacin restarted, up to 2 week course to be taken 3 times daily with meals.   Advised to avoid BC powder.   Advice to return if symptoms worsen or new symptoms arise.

## 2012-01-22 ENCOUNTER — Telehealth: Payer: Self-pay | Admitting: Internal Medicine

## 2012-01-22 DIAGNOSIS — I10 Essential (primary) hypertension: Secondary | ICD-10-CM

## 2012-01-22 MED ORDER — INDOMETHACIN 50 MG PO CAPS: 50.0000 mg | ORAL_CAPSULE | Freq: Three times a day (TID) | ORAL | Status: DC

## 2012-01-22 MED ORDER — METOPROLOL TARTRATE 25 MG PO TABS: 25.0000 mg | ORAL_TABLET | Freq: Two times a day (BID) | ORAL | Status: DC

## 2012-01-22 NOTE — Telephone Encounter (Signed)
Pt called at 7 30 pm on 01/22/2012. She saw her PCP Dr. Thad Ranger yesterday and was prescribed Metoprolol and Indomethacin- which she wanted to be sent at [facility]. Prescriptions were sent to [facility] and so when she called [facility], she didn't get it filled. I sent both meds to [facility] and she will pick it up tonight.

## 2012-01-22 NOTE — Addendum Note (Signed)
Addended by: Priscella Mann on: 01/22/2012 10:48 AM   Modules accepted: Orders

## 2012-01-27 DIAGNOSIS — E781 Pure hyperglyceridemia: Secondary | ICD-10-CM | POA: Insufficient documentation

## 2012-01-27 MED ORDER — GEMFIBROZIL 600 MG PO TABS: 600.0000 mg | ORAL_TABLET | Freq: Two times a day (BID) | ORAL | Status: DC

## 2012-01-27 NOTE — Progress Notes (Signed)
Quick Note:  Patient continues to have considerable hypertriglyceridemia despite being fasting. She needs to start a fibrate. I will send one to her pharmacy and as triage to call in.  ______

## 2012-01-27 NOTE — Addendum Note (Signed)
Addended by: Priscella Mann on: 01/27/2012 09:55 AM   Modules accepted: Orders

## 2012-01-27 NOTE — Assessment & Plan Note (Addendum)
ADDENDUM TO CLINIC NOTE AFTER LABS RESULTED: 01/27/2012, 9:49 AM  Pertinent Labs: Lipid Panel     Component Value Date/Time   CHOL 179 01/21/2012 1402   TRIG 634* 01/21/2012 1402   HDL 42 01/21/2012 1402   CHOLHDL 4.3 01/21/2012 1402   VLDL NOT CALC 01/21/2012 1402   LDLCALC Comment:   Not calculated due to Triglyceride >400.  01/21/2012 1402    Assessment: Patient continues to have hypertriglyceridemia although fasting, therefore, this seems to be a true issue and not just reflective of what she has eaten.  Plan:      Start gemfibrozil(called into [place] pharmacy) - will ask triage to call pt and inform her of results, and need to star therapy to reduce cardiovascular risks.  Will ask triage to inform patient that as with any new medication, she should pay attention to side effects / medication effects - you can have symptoms such as stomach upset (most commonly), nausea, rash, difficulty breathing, etc. If any of these issues arise, or any other side effects, she needs to contact the clinic.

## 2012-02-24 ENCOUNTER — Encounter: Payer: Self-pay | Admitting: Internal Medicine

## 2012-02-24 ENCOUNTER — Ambulatory Visit (INDEPENDENT_AMBULATORY_CARE_PROVIDER_SITE_OTHER): Payer: Self-pay | Admitting: Internal Medicine

## 2012-02-24 VITALS — BP 122/76 | HR 62 | Temp 97.2°F | Ht 66.0 in | Wt 188.6 lb

## 2012-02-24 DIAGNOSIS — L209 Atopic dermatitis, unspecified: Secondary | ICD-10-CM

## 2012-02-24 DIAGNOSIS — L2089 Other atopic dermatitis: Secondary | ICD-10-CM

## 2012-02-24 DIAGNOSIS — M109 Gout, unspecified: Secondary | ICD-10-CM

## 2012-02-24 DIAGNOSIS — L309 Dermatitis, unspecified: Secondary | ICD-10-CM

## 2012-02-24 DIAGNOSIS — E781 Pure hyperglyceridemia: Secondary | ICD-10-CM

## 2012-02-24 MED ORDER — LORATADINE 10 MG PO TABS: 10.0000 mg | ORAL_TABLET | Freq: Every day | ORAL | Status: DC

## 2012-02-24 NOTE — Assessment & Plan Note (Signed)
Pertinent Labs: Lipid Panel     Component Value Date/Time   CHOL 179 01/21/2012 1402   TRIG 634* 01/21/2012 1402   HDL 42 01/21/2012 1402   CHOLHDL 4.3 01/21/2012 1402   VLDL NOT CALC 01/21/2012 1402   LDLCALC Comment:   Not calculated due to Triglyceride >400.  01/21/2012 1402    Assessment: The patient was started on gemfibrozil on 01/28/2012, in the setting of hypertriglyceridemia (elevated triglyceride level was in fasting state.) States she is tolerating the medication well, without resultant side effects.  Plan:      Continue gemfibrozil.  Recheck fasting lipid panel at next visit.

## 2012-02-24 NOTE — Assessment & Plan Note (Signed)
Assessment: Patient's symptoms of left foot, predominantly distal foot swelling, tenderness with even sheets touching her foot, swelling over the first MTP is most consistent with acute gouty flare. She has had recurrent flares somewhat back to back over last several months. She does well when on treatment, and then as it ends, has repeat flare. This likely indicates that she needs to be on maintenance therapy.  Plan:      The patient has already completed one week of indomethacin. She is advised to continue one additional week.  Return to clinic in one week, at which time BMET and uric acid level should be checked.   On followup in one week, should be started on allopurinol if acute gouty flare has resolved.  Advice to return if symptoms worsen or new symptoms arise.

## 2012-02-24 NOTE — Assessment & Plan Note (Signed)
Assessment:  The patient was referred to dermatology (Dr. Terri Piedra) - appt on 06/17 with biopsies at that time showing focal scale and parakeratosis overlying epidermis containing occasional necrotic keratinocytes. Findings most concerning for arthropod bite (possible scabies) versus drug reaction - such as HCTZ. She has stopped the HCTZ and continues to have recurrent rash. She is taking the Clobetasol cream, but was unable to afford the permethrin cream. I am moreso now concerned about possible scabies given recurrence despite treatment with otherwise appropriate medications. She will need to complete treatment with the permethrin  Plan:      Continue Clobetasol cream, and recommended to fill the permethrin cream per derm.

## 2012-02-24 NOTE — Progress Notes (Signed)
Subjective:    Patient ID: Jenna May   Gender: female   DOB: [birth year]   Age: 56 y.o.   MRN: [id number]  HPI: Jenna May is a 56 y.o. with a PMHx of HTN, anxiety, normocytic anemia who presented to clinic today for the following:  1) HLD - currently taking Gemfibrozil, which was just started on 07/03 in response to hypertriglyceridemia. Patient misses doses 0 x per week on average. denies chest pain, difficulty breathing, palpitations, tachycardia, and muscle pains. does not request refills today.   2) Atopic dermatitis vs. scapies - patient has had ongoing issue with atopic dermatitis vs scabies for several months. She was evaluated by dermatology who performed akin biopsy showing atopic dermatitis vs scabies. She was recommended scabies treatment and atopic dermatitis treatment to assess for effect with either interventions. Despite treatment with clobetasol, she continues to have recurrent lesions on her forearms. She did not ever fill her scabies treatment because it was too expensive. States that she trying to avoid non-hypoallergenic products. She is not having hand lesions. No fevers, chills, nausea, vomiting.   3) Gout - Patient has been treated with multiple courses of Indomethacin since first experiencing an acute gouty attack on June 5,2013 when evaluated [facility]. Uric acid level was 9.6, she is not on chronic therapy. When she was then seen in clinic on 01/21/2012, her course was extended for max of 2 additional weeks (or as soon as symptoms relieved.). Pt indicates that she continued the whole 2 week course. After completion of therapy, she has noted worsening swelling and pain of her left foot, at the MTP.  There was a 3rd course inadvertantly sent to her pharmacy, which she has started about a week ago, with improvement of symptoms. Pt indicates pain fairly stable at this point, but she is scared of the pain returning.  Now, no longer having fever,  chills, redness, no open lesions or sores. No recent trauma, falls.    Review of Systems: Per HPI.   Current Outpatient Medications: Medication Sig  . amLODipine (NORVASC) 10 MG tablet Take 1 tablet (10 mg total) by mouth daily.  . clobetasol cream (TEMOVATE) 0.05 % Apply 1 application topically 2 (two) times daily.  Marland Kitchen gemfibrozil (LOPID) 600 MG tablet Take 1 tablet (600 mg total) by mouth 2 (two) times daily. Take 30 minutes before breakfast and 30 minutes before dinner  . indomethacin (INDOCIN) 50 MG capsule Take 1 capsule (50 mg total) by mouth 3 (three) times daily with meals.  Marland Kitchen loratadine (CLARITIN) 10 MG tablet Take 1 tablet (10 mg total) by mouth daily.  . metoprolol tartrate (LOPRESSOR) 25 MG tablet Take 1 tablet (25 mg total) by mouth 2 (two) times daily.  Marland Kitchen triamcinolone ointment (KENALOG) 0.1 % Apply topically 2 (two) times daily.    Allergies: No Known Allergies   Past Medical History  Diagnosis Date  . Hypertension   . Tobacco abuse     Quit 2009, after approximately 15 pack year smoking history.  . Anxiety   . 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 Umbilical hernia     S/P repair approximately 1995  . Postmenopausal status   . Bartholin cyst 06/2003  . Normocytic anemia     Unclear etiology. Anemia panel in 03/2010 showing Iron 53 , TIBC 303 , iron saturation percentage 17, ferritin 197 , B12 and folate within normal limits.  . Gout  Past Surgical History  Procedure Date  . Umbilical hernia repair approximately 1995  . Appendectomy approximately 1991  . Total abdominal hysterectomy 03/1996    2/2 fibroids     Objective:    Physical Exam: Filed Vitals:   02/24/12 0828  BP: 122/76  Pulse: 62  Temp: 97.2 F (36.2 C)     General: Vital signs reviewed and noted. Well-developed, well-nourished, in no acute distress; alert, appropriate and cooperative throughout examination.  Head: Normocephalic, atraumatic.  Lungs:   Normal respiratory effort. Clear to auscultation BL without crackles or wheezes.  Heart: RRR. S1 and S2 normal without gallop, murmur, or rubs.  Abdomen:  BS normoactive. Soft, Nondistended, non-tender.  No masses or organomegaly.  Extremities: No pretibial edema. Left pedal edema with tenderness to palpation over dorsal surface of foot across all MTPs. Mild increased swelling and darkening the skin color over the first MTP. No open lesions noted. Sensations intact in the bilateral lower extremities. Muscle strength 5 out of 5 in the feet bilaterally.   Skin: Right forearm with multiple erythematous lesions     Assessment/ Plan:    Case and plan of care discussed with Dr. Jonah Blue.

## 2012-02-24 NOTE — Patient Instructions (Signed)
   Please follow-up at the clinic in 1 week , at which time we will reevaluate your foot swelling - OR, please follow-up in the clinic sooner if needed.  There have not been changes in your medications.    If you have been started on new medication(s), and you develop symptoms concerning for allergic reaction, including, but not limited to, throat closing, tongue swelling, rash, please stop the medication immediately and call the clinic at [phone], and go to the ER.  If symptoms worsen, or new symptoms arise, please call the clinic or go to the ER.  Please bring all of your medications in a bag to your next visit.

## 2012-03-02 ENCOUNTER — Encounter: Payer: Self-pay | Admitting: Internal Medicine

## 2012-03-02 ENCOUNTER — Other Ambulatory Visit: Payer: Self-pay | Admitting: Internal Medicine

## 2012-03-02 ENCOUNTER — Ambulatory Visit (INDEPENDENT_AMBULATORY_CARE_PROVIDER_SITE_OTHER): Payer: Self-pay | Admitting: Internal Medicine

## 2012-03-02 VITALS — BP 125/74 | HR 61 | Temp 98.3°F | Ht 66.0 in | Wt 188.6 lb

## 2012-03-02 DIAGNOSIS — M109 Gout, unspecified: Secondary | ICD-10-CM

## 2012-03-02 DIAGNOSIS — L209 Atopic dermatitis, unspecified: Secondary | ICD-10-CM

## 2012-03-02 DIAGNOSIS — E781 Pure hyperglyceridemia: Secondary | ICD-10-CM

## 2012-03-02 DIAGNOSIS — L2089 Other atopic dermatitis: Secondary | ICD-10-CM

## 2012-03-02 LAB — LIPID PANEL
HDL: 37 mg/dL — ABNORMAL LOW (ref >39)
Total CHOL/HDL Ratio: 5.2 Ratio
Triglycerides: 629 mg/dL — ABNORMAL HIGH (ref <150)

## 2012-03-02 LAB — BASIC METABOLIC PANEL WITH GFR
CO2: 24 mEq/L (ref 19–32)
Calcium: 10 mg/dL (ref 8.4–10.5)
Creat: 0.66 mg/dL (ref 0.50–1.10)
GFR, Est African American: >89 mL/min
GFR, Est Non African American: >89 mL/min
Sodium: 137 mEq/L (ref 135–145)

## 2012-03-02 LAB — URIC ACID: Uric Acid, Serum: 8.5 mg/dL — ABNORMAL HIGH (ref 2.4–7.0)

## 2012-03-02 NOTE — Progress Notes (Signed)
Subjective:   Patient ID: Jenna May female   DOB: 1956-02-27 56 y.o.   MRN: [id number]  HPI:  [name] is a 56 y.o. with a PMHx of HTN, anxiety, normocytic anemia who presented to clinic today for the following:  1) HLD - currently taking Gemfibrozil, which was just started on 07/03 in response to hypertriglyceridemia.denies chest pain, difficulty breathing, palpitations, tachycardia, and muscle pains.   2) Atopic dermatitis vs. scapies - patient has had ongoing issue with atopic dermatitis vs scabies for several months. She was evaluated by dermatology who performed akin biopsy showing atopic dermatitis vs scabies. She was recommended scabies treatment and atopic dermatitis treatment to assess for effect with either interventions. Despite treatment with clobetasol, she continues to have recurrent lesions on her forearms. She did not ever fill her scabies treatment because it was too expensive. States that she trying to avoid non-hypoallergenic products. Marland Kitchen   3) Gout - Patient has been treated with multiple courses of Indomethacin since first experiencing an acute gouty attack on June 5,2013 when evaluated [facility]. Uric acid level was 9.6, she is not on chronic therapy. When she was then seen in clinic on 01/21/2012, her course was extended for max of 2 additional weeks (or as soon as symptoms relieved.). Pt indicates that she continued the whole 2 week course. After completion of therapy, she has noted worsening swelling and pain of her left foot, at the MTP.  There was a 3rd course inadvertantly sent to her pharmacy, which she has started about two week ago, with improvement of symptoms. She still c/o left great toe pain and swelling at times. Overall, she feel better.    Now, no longer having fever, chills, redness, no open lesions or sores. No recent trauma, falls.     Past Medical History  Diagnosis Date  . Hypertension   . Tobacco abuse     Quit 2009,  after approximately 15 pack year smoking history.  . Anxiety   . 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 Umbilical hernia     S/P repair approximately 1995  . Postmenopausal status   . Bartholin cyst 06/2003  . Normocytic anemia     Unclear etiology. Anemia panel in 03/2010 showing Iron 53 , TIBC 303 , iron saturation percentage 17, ferritin 197 , B12 and folate within normal limits.  . Gout    Current Outpatient Prescriptions  Medication Sig Dispense Refill  . amLODipine (NORVASC) 10 MG tablet Take 1 tablet (10 mg total) by mouth daily.  30 tablet  11  . clobetasol cream (TEMOVATE) 0.05 % Apply 1 application topically 2 (two) times daily.      Marland Kitchen gemfibrozil (LOPID) 600 MG tablet Take 1 tablet (600 mg total) by mouth 2 (two) times daily. Take 30 minutes before breakfast and 30 minutes before dinner  60 tablet  2  . indomethacin (INDOCIN) 50 MG capsule Take 1 capsule (50 mg total) by mouth 3 (three) times daily with meals.  42 capsule  0  . loratadine (CLARITIN) 10 MG tablet Take 1 tablet (10 mg total) by mouth daily.  30 tablet  2  . metoprolol tartrate (LOPRESSOR) 25 MG tablet Take 1 tablet (25 mg total) by mouth 2 (two) times daily.  60 tablet  11  . triamcinolone ointment (KENALOG) 0.1 % Apply topically 2 (two) times daily.  80 g  0   Family History  Problem Relation Age of  Onset  . Hypertension Mother   . Diabetes Father   . Hypertension Father   . Hypertension Sister   . Heart attack Brother 45   History   Social History  . Marital Status: Single    Spouse Name: N/A    Number of Children: N/A  . Years of Education: 12th grade   Occupational History  . uemployed    Social History Main Topics  . Smoking status: Former Smoker -- 0.4 packs/day for 30 years    Types: Cigarettes    Quit date: 04/11/2008  . Smokeless tobacco: Never Used  . Alcohol Use: No  . Drug Use: No  . Sexually Active: None   Other Topics Concern  . None   Social  History Narrative   Unemployed. Intermittently worked, Engineer, mining, [employer]. Graduated from high school, [employer].Single.No children.[place] card.   Review of Systems: Review of Systems:  Constitutional:  Denies fever, chills, diaphoresis, appetite change and fatigue.   HEENT:  Denies congestion, sore throat, rhinorrhea, sneezing, mouth sores, trouble swallowing, neck pain   Respiratory:  Denies SOB, DOE, cough, and wheezing.   Cardiovascular:  Denies palpitations and leg swelling.   Gastrointestinal:  Denies nausea, vomiting, abdominal pain, diarrhea, constipation, blood in stool and abdominal distention.   Genitourinary:  Denies dysuria, urgency, frequency, hematuria, flank pain and difficulty urinating.   Musculoskeletal:  Denies myalgias, back pain, and gait problem. Positive for left great toe pain and swelling.  Skin:  Denies pallor, rash and wound.   Neurological:  Denies dizziness, seizures, syncope, weakness, light-headedness, numbness and headaches.    .   Objective:  Physical Exam: Filed Vitals:   03/02/12 0828  BP: 125/74  Pulse: 61  Temp: 98.3 F (36.8 C)  TempSrc: Oral  Height: 5\' 6"  (1.676 m)  Weight: 188 lb 9.6 oz (85.548 kg)   General:  Vital signs reviewed and noted. Well-developed, well-nourished, in no acute distress; alert, appropriate and cooperative throughout examination.  Head:  Normocephalic, atraumatic.  Lungs:  Normal respiratory effort. Clear to auscultation BL without crackles or wheezes.  Heart:  RRR. S1 and S2 normal without gallop, murmur, or rubs.  Abdomen:  BS normoactive. Soft, Nondistended, non-tender. No masses or organomegaly.  Extremities:  No pretibial edema. Left pedal edema with tenderness to palpation over dorsal surface of foot across all MTPs. Mild increased swelling and darkening the skin color over the first MTP. No open lesions noted. Sensations intact in the bilateral lower extremities. Muscle strength 5  out of 5 in the feet bilaterally.  Skin:  Right forearm with multiple erythematous lesions somehow better than before.  Assessment & Plan:

## 2012-03-02 NOTE — Assessment & Plan Note (Addendum)
Patient reports that she has been compliant with Clobetasol. But she can not afford Permethrin cream. Patient states that Clobetasol helps with her problem. She reports intermittent flare ups.   - recommend to fill Permethrin. But patient states that it cost her 70 dollars.  - I called [facility] program and they only charge her $6 for above medication. Patient is instructed to go pick up her medication.

## 2012-03-02 NOTE — Assessment & Plan Note (Signed)
-   patient reports compliant with the treatment   - will check her lipid panel.

## 2012-03-02 NOTE — Assessment & Plan Note (Addendum)
-   recurrent acute gout flare ups. Responding to indomethacin this time. She reports she only takes indomethacin 50mg  po twice daily last week after her appointment with Dr. Thad Ranger. She understand that she needs to take meals with the medications.  Patient still reports intermittent pain now. But her pain is no longer severe like before. She feels that she can try one more week to see her pain will completely go away.   - patient can continueher indomethacinfor one more week for acute gout flare up -"urate-lowering therapy should not be initiated until after an acute gout flare has resolved, and we wait at least two weeks after an acute flare has subsided to initiate urate-lowering medication".  - if her pain is completely gone in one week, I will wait for at least 2 weeks after her acute flare ups subsided before starting her on allopurinol. - if she has recurrent flare up again, may consider glucocorticoid since she has had three rounds of indomethacin  Tx without satisfactory Improment.   - I have discussed with her about the risk factors for gout including diet, obesity, HLD,HTN.

## 2012-03-02 NOTE — Patient Instructions (Addendum)
1. Please call me in one week to report how her gout pain is.  2. Follow up with me in 3 week 3. I have called [facility] program and they will only charge her $6 for Permethrin cream.

## 2012-03-03 ENCOUNTER — Other Ambulatory Visit: Payer: Self-pay | Admitting: Internal Medicine

## 2012-03-03 DIAGNOSIS — E781 Pure hyperglyceridemia: Secondary | ICD-10-CM

## 2012-03-03 NOTE — Addendum Note (Signed)
Addended by: Bufford Spikes on: 03/03/2012 09:43 AM   Modules accepted: Orders

## 2012-03-03 NOTE — Addendum Note (Signed)
Addended by: Bufford Spikes on: 03/03/2012 09:42 AM   Modules accepted: Orders

## 2012-03-09 ENCOUNTER — Telehealth: Payer: Self-pay | Admitting: Internal Medicine

## 2012-03-09 NOTE — Telephone Encounter (Signed)
I have called patient regarding her acute gout. She states that she has no redness, swelling or pain on her left foot where her acute gout was. Patient is instructed to stop Indomethacin today and follow up in 2 weeks. She has a f/u appt on 03/23/12.  Patient is instructed to call the clinic if she has recurrent flare up of acute gout before her next appt. We will consider to start prevention treatment with allopurinol at next office visit if she continues to do ok.

## 2012-03-23 ENCOUNTER — Encounter: Payer: Self-pay | Admitting: Internal Medicine

## 2012-03-23 ENCOUNTER — Ambulatory Visit (INDEPENDENT_AMBULATORY_CARE_PROVIDER_SITE_OTHER): Payer: Self-pay | Admitting: Internal Medicine

## 2012-03-23 VITALS — BP 118/72 | HR 68 | Temp 97.6°F | Ht 66.0 in | Wt 187.8 lb

## 2012-03-23 DIAGNOSIS — L2089 Other atopic dermatitis: Secondary | ICD-10-CM

## 2012-03-23 DIAGNOSIS — L309 Dermatitis, unspecified: Secondary | ICD-10-CM

## 2012-03-23 DIAGNOSIS — L209 Atopic dermatitis, unspecified: Secondary | ICD-10-CM

## 2012-03-23 DIAGNOSIS — M109 Gout, unspecified: Secondary | ICD-10-CM

## 2012-03-23 MED ORDER — LORATADINE 10 MG PO TABS: 10.0000 mg | ORAL_TABLET | Freq: Every day | ORAL | Status: DC

## 2012-03-23 MED ORDER — ALLOPURINOL 100 MG PO TABS: 100.0000 mg | ORAL_TABLET | Freq: Two times a day (BID) | ORAL | Status: DC

## 2012-03-23 NOTE — Progress Notes (Signed)
Subjective:     Patient ID: Jenna May, female   DOB: 02-02-1956, 56 y.o.   MRN: [id number]  HPI The patient is a 56 year old female who comes in today for a followup visit. She did get permethrin cream since last visit and has applied it once. She does need to apply it again this weekend. She states that she has not noticed significant resolution of her bumps. She continues to use other creams that she has at home including clobetasol cream. She also states that her gout is fairly resolved and she has been trying to not take indomethacin. She is not currently having gout flare. The intention was to return at today's visit to start on gout treatment. She has not have any other acute complaints at today's visit.  Review of Systems  Constitutional: Negative for fever, chills, diaphoresis, activity change, appetite change, fatigue and unexpected weight change.  HENT: Negative.   Eyes: Negative.   Respiratory: Negative for cough, choking, chest tightness, shortness of breath and wheezing.   Cardiovascular: Negative.  Negative for chest pain, palpitations and leg swelling.  Gastrointestinal: Negative.  Negative for nausea, vomiting, abdominal pain, diarrhea and constipation.  Musculoskeletal: Negative for myalgias, back pain, joint swelling, arthralgias and gait problem.  Skin: Positive for color change and rash. Negative for pallor and wound.  Neurological: Negative.   Hematological: Negative.   Psychiatric/Behavioral: Negative.        Objective:   Physical Exam  Constitutional: She is oriented to person, place, and time. She appears well-developed and well-nourished. No distress.  HENT:  Head: Normocephalic and atraumatic.  Eyes: EOM are normal. Pupils are equal, round, and reactive to light.  Neck: Normal range of motion. Neck supple.  Cardiovascular: Normal rate and regular rhythm.   Pulmonary/Chest: Effort normal and breath sounds normal. No respiratory distress. She has no  wheezes. She has no rales. She exhibits no tenderness.  Abdominal: Soft. Bowel sounds are normal. She exhibits no distension. There is no tenderness. There is no rebound.  Musculoskeletal: Normal range of motion. She exhibits no edema and no tenderness.       Left foot is slightly darker than right foot. No signs of active gout flare or erythema of great toe.  Neurological: She is alert and oriented to person, place, and time. No cranial nerve deficit.  Skin: Skin is warm and dry. Rash noted. She is not diaphoretic. There is erythema. No pallor.       Several bumps along flexor surface of right arm. She states that they itch and sometimes are worse. She states that they are not so bad at today's visit.  Psychiatric: She has a normal mood and affect. Her behavior is normal. Judgment and thought content normal.       Assessment/Plan:   1. Please see problem oriented charting.  2. Disposition-the patient was advised to take last application of permethrin lotion. She was also advised to continue using creams as available. Advised to stay away from allergenic substances. She was started on allopurinol. She will take allopurinol 100 mg once a day for one week then 100 mg twice a day. She will return in 4-6 weeks for uric acid level. No other changes to her medical regimen at today's visit. Also refilled loratadine.

## 2012-03-23 NOTE — Patient Instructions (Addendum)
You were seen for your gout today and we will start a medicine called allopurinol. For the first week take 1 pill, then after that take 1 pill twice a day. If you have questions or problems please call us at [phone]. We will see you back in 4-6 weeks.   Eczema Atopic dermatitis, or eczema, is an inherited type of sensitive skin. Often people with eczema have a family history of allergies, asthma, or hay fever. It causes a red itchy rash and dry scaly skin. The itchiness may occur before the skin rash and may be very intense. It is not contagious. Eczema is generally worse during the cooler winter months and often improves with the warmth of summer. Eczema usually starts showing signs in infancy. Some children outgrow eczema, but it may last through adulthood. Flare-ups may be caused by:  Eating something or contact with something you are sensitive or allergic to.   Stress.  DIAGNOSIS  The diagnosis of eczema is usually based upon symptoms and medical history. TREATMENT  Eczema cannot be cured, but symptoms usually can be controlled with treatment or avoidance of allergens (things to which you are sensitive or allergic to).  Controlling the itching and scratching.   Use over-the-counter antihistamines as directed for itching. It is especially useful at night when the itching tends to be worse.   Use over-the-counter steroid creams as directed for itching.   Scratching makes the rash and itching worse and may cause impetigo (a skin infection) if fingernails are contaminated (dirty).   Keeping the skin well moisturized with creams every day. This will seal in moisture and help prevent dryness. Lotions containing alcohol and water can dry the skin and are not recommended.   Limiting exposure to allergens.   Recognizing situations that cause stress.   Developing a plan to manage stress.  HOME CARE INSTRUCTIONS   Take prescription and over-the-counter medicines as directed by your  caregiver.   Do not use anything on the skin without checking with your caregiver.   Keep baths or showers short (5 minutes) in warm (not hot) water. Use mild cleansers for bathing. You may add non-perfumed bath oil to the bath water. It is best to avoid soap and bubble bath.   Immediately after a bath or shower, when the skin is still damp, apply a moisturizing ointment to the entire body. This ointment should be a petroleum ointment. This will seal in moisture and help prevent dryness. The thicker the ointment the better. These should be unscented.   Keep fingernails cut short and wash hands often. If your child has eczema, it may be necessary to put soft gloves or mittens on your child at night.   Dress in clothes made of cotton or cotton blends. Dress lightly, as heat increases itching.   Avoid foods that may cause flare-ups. Common foods include cow's milk, peanut butter, eggs and wheat.   Keep a child with eczema away from anyone with fever blisters. The virus that causes fever blisters (herpes simplex) can cause a serious skin infection in children with eczema.  SEEK MEDICAL CARE IF:   Itching interferes with sleep.   The rash gets worse or is not better within one week following treatment.   The rash looks infected (pus or soft yellow scabs).   You or your child has an oral temperature above 102 F (38.9 C).   Your baby is older than 3 months with a rectal temperature of 100.5 F (38.1 C) or higher  for more than 1 day.   The rash flares up after contact with someone who has fever blisters.  SEEK IMMEDIATE MEDICAL CARE IF:   Your baby is older than 3 months with a rectal temperature of 102 F (38.9 C) or higher.   Your baby is older than 3 months or younger with a rectal temperature of 100.4 F (38 C) or higher.  Document Released: 07/10/2000 Document Revised: 07/02/2011 Document Reviewed: 05/15/2009 [facility] Patient Information 2012 [place], [place].

## 2012-03-23 NOTE — Assessment & Plan Note (Signed)
Patient was advised to use last application of permethrin cream. She was also advised to continue taking loratadine, and using clobetasol cream on the rash. She states that she did not notice any benefit with permethrin cream making scabies less likely on differential. This is more likely to be atopic dermatitis. She was advised to stay away from any new soaps detergents or allergenic products. She was also advised to stay away from perfumes or lotions.

## 2012-03-23 NOTE — Assessment & Plan Note (Signed)
Patient does have level of uric acid is 8.5. Goal is less than 6. Did start her on allopurinol at today's visit. She will be on 100 mg daily for one week then 100 mg twice a day. She is advised to come back in 4-6 weeks for uric acid level. Treatment can be titrated to uric acid level.

## 2012-04-21 ENCOUNTER — Ambulatory Visit (INDEPENDENT_AMBULATORY_CARE_PROVIDER_SITE_OTHER): Payer: Self-pay | Admitting: Internal Medicine

## 2012-04-21 ENCOUNTER — Other Ambulatory Visit: Payer: Self-pay | Admitting: Internal Medicine

## 2012-04-21 ENCOUNTER — Encounter: Payer: Self-pay | Admitting: Internal Medicine

## 2012-04-21 VITALS — BP 134/79 | HR 82 | Temp 99.2°F | Ht 66.0 in | Wt 187.7 lb

## 2012-04-21 DIAGNOSIS — K056 Periodontal disease, unspecified: Secondary | ICD-10-CM

## 2012-04-21 DIAGNOSIS — R5383 Other fatigue: Secondary | ICD-10-CM

## 2012-04-21 DIAGNOSIS — Z23 Encounter for immunization: Secondary | ICD-10-CM

## 2012-04-21 DIAGNOSIS — E781 Pure hyperglyceridemia: Secondary | ICD-10-CM

## 2012-04-21 DIAGNOSIS — K029 Dental caries, unspecified: Secondary | ICD-10-CM

## 2012-04-21 DIAGNOSIS — Z0000 Encounter for general adult medical examination without abnormal findings: Secondary | ICD-10-CM

## 2012-04-21 DIAGNOSIS — D649 Anemia, unspecified: Secondary | ICD-10-CM

## 2012-04-21 DIAGNOSIS — Z5189 Encounter for other specified aftercare: Secondary | ICD-10-CM

## 2012-04-21 DIAGNOSIS — R739 Hyperglycemia, unspecified: Secondary | ICD-10-CM

## 2012-04-21 DIAGNOSIS — E038 Other specified hypothyroidism: Secondary | ICD-10-CM

## 2012-04-21 DIAGNOSIS — K061 Gingival enlargement: Secondary | ICD-10-CM

## 2012-04-21 DIAGNOSIS — Z598 Other problems related to housing and economic circumstances: Secondary | ICD-10-CM

## 2012-04-21 DIAGNOSIS — M109 Gout, unspecified: Secondary | ICD-10-CM

## 2012-04-21 DIAGNOSIS — E039 Hypothyroidism, unspecified: Secondary | ICD-10-CM

## 2012-04-21 DIAGNOSIS — R7309 Other abnormal glucose: Secondary | ICD-10-CM

## 2012-04-21 DIAGNOSIS — I10 Essential (primary) hypertension: Secondary | ICD-10-CM

## 2012-04-21 LAB — POCT GLYCOSYLATED HEMOGLOBIN (HGB A1C): Hemoglobin A1C: 5.4

## 2012-04-21 LAB — CBC
HCT: 33.8 % — ABNORMAL LOW (ref 36.0–46.0)
Platelets: 295 10*3/uL (ref 150–400)
RBC: 4.07 MIL/uL (ref 3.87–5.11)
RDW: 14.2 % (ref 11.5–15.5)
WBC: 6.4 10*3/uL (ref 4.0–10.5)

## 2012-04-21 LAB — GLUCOSE, CAPILLARY: Glucose-Capillary: 85 mg/dL (ref 70–99)

## 2012-04-21 NOTE — Progress Notes (Signed)
Subjective:    Patient: Jenna May   Age/ Gender: 56 y.o., female   MRN: [id number]  DOB: 1955/11/29     HPI: [name] is a 56 y.o. with a PMHx of HTN, HLD, and dermatitis who presented to clinic today for the following:  1) HTN - Patient does not check blood pressure regularly at home. Currently taking amlodipine (Norvase) and metoprolol - has been stopped for 2 days with concern for gum swelling. Patient misses doses 0 x per week on average. denies headaches, dizziness, lightheadedness, chest pain, shortness of breath.  does not request refills today.  2) HLD - currently on gemfibrozil, which was started in 01/2012 in the setting of hypertriglyceridemia with trigs > 600 at that time. She states she is taking it regularly without missing doses and she is tolerating the medication well. Denies chest pain, difficulty breathing, palpitations, tachycardia, and muscle pains.   3) Gingival hyperplasia - patients that over last few months, has experienced gingival swelling and hypertrophy. No specific associated bleeding of the gums, lymphadenopathy, abscesses, or abnormal drainage noted. She was reading about her medications, and felt that perhaps Metoprolol could have caused the issue. Therefore, she has been off of Metoprolol for 2 days. Has not recently seen a dentist and does note loosening of right upper molars.  4) Gout - was seen in clinic in in 01/2012 with recurrent gout flare, treated with indomethacin (to complete a 2 week course that she had already been taking for 1 week). She was then seen in clinic in 02/2012 and started on allopurinol 100mg  to be escalated to two tabs daily. She has not escalated her dosing. Instead, patient continues on 1 tab daily. However, does report resolution of the foot swelling and pain. Has not had recurrence of symptoms. No fevers, chills, erythema, warmth, or swelling of feet or other joints.   5) Chronic normocytic anemia - patient is known  to have baseline anemia with BL Hgb of 10-11 g/dL of unclear cause. She denies unexplained fevers, chills, lightheadedness, dizziness, blood in stools, blood in urine.    Review of Systems: Per HPI.   Current Outpatient Medications: Medication Sig  . allopurinol (ZYLOPRIM) 100 MG tablet Take 1 tablet (100 mg total) by mouth daily.  Marland Kitchen amLODipine (NORVASC) 10 MG tablet Take 1 tablet (10 mg total) by mouth daily.  Marland Kitchen gemfibrozil (LOPID) 600 MG tablet Take 1 tablet (600 mg total) by mouth 2 (two) times daily. Take 30 minutes before breakfast and 30 minutes before dinner  . loratadine (CLARITIN) 10 MG tablet Take 1 tablet (10 mg total) by mouth daily.  . permethrin (ELIMITE) 5 % cream Apply 1 application topically once. Apply neck to toe. Rinse in the morning, repeat in 1 week.  . triamcinolone ointment (KENALOG) 0.1 % Apply topically 2 (two) times daily.  . metoprolol tartrate (LOPRESSOR) 25 MG tablet - has not taken for 2 days Take 1 tablet (25 mg total) by mouth 2 (two) times daily.    Allergies: No Known Allergies  Past Medical History  Diagnosis Date  . Hypertension   . Tobacco abuse     Quit 2009, after approximately 15 pack year smoking history.  . Anxiety   . 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 Umbilical hernia     S/P repair approximately 1995  . Postmenopausal status   . Bartholin cyst 06/2003  . Normocytic anemia     Unclear etiology. Anemia panel  in 03/2010 showing Iron 53 , TIBC 303 , iron saturation percentage 17, ferritin 197 , B12 and folate within normal limits.  . Gout     Past Surgical History  Procedure Date  . Umbilical hernia repair approximately 1995  . Appendectomy approximately 1991  . Total abdominal hysterectomy 03/1996    2/2 fibroids     Objective:    Physical Exam: Filed Vitals:   04/21/12 1555  BP: 134/79  Pulse: 82  Temp: 99.2 F (37.3 C)     General: Vital signs reviewed and noted. Well-developed,  well-nourished, in no acute distress; alert, appropriate and cooperative throughout examination.  Head: Normocephalic, atraumatic.  Mouth: Gingival overgrowth noted in lower gums, no overt bleeding or evidence of abscess. On right upper, has evidence of dental caries.   Lungs:  Normal respiratory effort. Clear to auscultation BL without crackles or wheezes.  Heart: RRR. S1 and S2 normal without gallop, murmur, or rubs.  Abdomen:  BS normoactive. Soft, Nondistended, non-tender.  No masses or organomegaly.  Extremities: No pretibial edema.   Skin: Right forearm with multiple darkened healed scars.    Assessment/ Plan:   Case and plan of care discussed with attending physician, Dr. Tacey Heap.

## 2012-04-21 NOTE — Patient Instructions (Signed)
   Please follow-up at the clinic in 2 months, at which time we will reevaluate your blood pressure - OR, please follow-up in the clinic sooner if needed.  There have been changes in your medications:  STOP METOPROLOL    You are getting labs today, if they are abnormal I will give you a call.   Please see the dentist as soon as possible.  If you have been started on new medication(s), and you develop symptoms concerning for allergic reaction, including, but not limited to, throat closing, tongue swelling, rash, please stop the medication immediately and call the clinic at [phone], and go to the ER.  If symptoms worsen, or new symptoms arise, please call the clinic or go to the ER.  Please bring all of your medications in a bag to your next visit.

## 2012-04-22 ENCOUNTER — Telehealth: Payer: Self-pay | Admitting: *Deleted

## 2012-04-22 LAB — TSH: TSH: 5.883 u[IU]/mL — ABNORMAL HIGH (ref 0.350–4.500)

## 2012-04-22 LAB — T4, FREE: Free T4: 0.94 ng/dL (ref 0.80–1.80)

## 2012-04-22 LAB — URIC ACID: Uric Acid, Serum: 6.1 mg/dL (ref 2.4–7.0)

## 2012-04-22 LAB — LIPID PANEL: HDL: 45 mg/dL (ref >39)

## 2012-04-22 LAB — ANEMIA PANEL
Folate: 15.6 ng/mL
Iron: 91 ug/dL (ref 42–145)
RBC.: 4.07 MIL/uL (ref 3.87–5.11)
Retic Ct Pct: 1.3 % (ref 0.4–2.3)
TIBC: 352 ug/dL (ref 250–470)
UIBC: 261 ug/dL (ref 125–400)

## 2012-04-22 LAB — LDL CHOLESTEROL, DIRECT: Direct LDL: 86 mg/dL

## 2012-04-22 MED ORDER — METOPROLOL TARTRATE 25 MG PO TABS: 25.0000 mg | ORAL_TABLET | Freq: Two times a day (BID) | ORAL | Status: DC

## 2012-04-22 MED ORDER — ALLOPURINOL 100 MG PO TABS: 200.0000 mg | ORAL_TABLET | Freq: Every day | ORAL | Status: DC

## 2012-04-22 MED ORDER — OMEGA-3 FATTY ACIDS 1000 MG PO CAPS: 1.0000 g | ORAL_CAPSULE | Freq: Every day | ORAL | Status: DC

## 2012-04-22 NOTE — Progress Notes (Signed)
Quick Note:  Uric acid levels improved and symptoms improved. Will continue to escalate to allopurinol 200mg  daily. ______

## 2012-04-22 NOTE — Progress Notes (Deleted)
Quick Note:  Trigs still high > 500 despite starting gemfibrozil. Will therefore add Fish oil to regimen. Patient advised. ______

## 2012-04-22 NOTE — Progress Notes (Signed)
Quick Note:  Hgb stable. Will continue to monitor. ______

## 2012-04-22 NOTE — Telephone Encounter (Signed)
Called pt, gave instructions: 1) stop norvasc/ amlodipine, may be r/t gum swelling 2) continue to take metoprolol 3)  Start fish oil, script sent to [facility], [place]  Pt repeated back instructions and verb understanding of instructions, she states she will f/u as discussed at her visit

## 2012-04-22 NOTE — Progress Notes (Deleted)
Quick Note:  TSH high, will therefore need to check free T4 to assess for hypothyroidism. ______

## 2012-04-22 NOTE — Progress Notes (Signed)
Quick Note:  No indication of prediabetes or diabetes at this time. ______

## 2012-04-26 DIAGNOSIS — K029 Dental caries, unspecified: Secondary | ICD-10-CM | POA: Insufficient documentation

## 2012-04-26 DIAGNOSIS — E038 Other specified hypothyroidism: Secondary | ICD-10-CM | POA: Insufficient documentation

## 2012-04-26 DIAGNOSIS — K061 Gingival enlargement: Secondary | ICD-10-CM | POA: Insufficient documentation

## 2012-04-26 DIAGNOSIS — Z598 Other problems related to housing and economic circumstances: Secondary | ICD-10-CM | POA: Insufficient documentation

## 2012-04-26 DIAGNOSIS — R739 Hyperglycemia, unspecified: Secondary | ICD-10-CM | POA: Insufficient documentation

## 2012-04-26 DIAGNOSIS — E039 Hypothyroidism, unspecified: Secondary | ICD-10-CM | POA: Insufficient documentation

## 2012-04-26 HISTORY — DX: Gingival enlargement: K06.1

## 2012-04-26 NOTE — Progress Notes (Addendum)
Quick Note:  TSH high and Free T4 normal, indicating subclinical hypothyroidism. Will plan to recheck in 3-6 months (TSH and free T4) with consideration to start therapy if symptomatic or TFTs abnormal at that time. ______

## 2012-04-26 NOTE — Assessment & Plan Note (Signed)
Assessment: I am concerned that this gingival overgrowth noted most predominantly on low gums is contributed by CCB usage (amlodipine). She is not on other offending agents. However, she also has poor dentition at baseline, and gingival or periodontal disease could further be contributing.  Plan:      DC amlodipine.  Dental referral for continued evaluation.

## 2012-04-26 NOTE — Addendum Note (Signed)
Addended by: Priscella Mann on: 04/26/2012 02:32 PM   Modules accepted: Orders

## 2012-04-26 NOTE — Assessment & Plan Note (Signed)
Health Maintenance  Topic Date Due  . Influenza Vaccine  03/27/2013  . Mammogram  08/10/2013  . Colonoscopy  05/29/2019  . Tetanus/tdap  11/05/2020    Assessment:  Procedures due: None  Labs due: None  Immunizations due: flu shot  Plan:  Flu shot today.  Labs today.

## 2012-04-26 NOTE — Assessment & Plan Note (Signed)
Pertinent Labs: Lipid Panel   Ref. Range 01/21/2012 14:02 03/02/2012 09:29 04/21/2012 16:17  Cholesterol Latest Range: 0-200 mg/dL 784 696 295  Triglycerides Latest Range: <150 mg/dL 284 (H) 132 (H) 440 (H)  HDL Latest Range: >39 mg/dL 42 37 (L) 45  LDL (calc) No range found Pend Pend Pend  Direct LDL No range found 63 57 86  VLDL Latest Range: 0-40 mg/dL NOT CALC NOT CALC NOT CALC  Total CHOL/HDL Ratio No range found 4.3 5.2 4.4    Assessment: The patient was started on gemfibrozil on 01/28/2012, in the setting of hypertriglyceridemia (elevated triglyceride level was in fasting state.) States she is tolerating the medication well, without resultant side effects. Her triglycerides remain elevated despite medication compliance with gemfibrozil with trigs > 500. Will need to work on this further.  Plan:      Check lipid panel today.  Will start fish oil, 1 gram daily.  Continue to emphasize low fat diet.

## 2012-04-26 NOTE — Assessment & Plan Note (Addendum)
Pertinent Labs:   Ref. Range 07/26/2009  04/21/2012   TSH Latest Range: 0.350-4.500 uIU/mL 3.986 5.883 (H)  Free T4 Latest Range: 0.80-1.80 ng/dL  1.61   Assessment: Patient has not had TFTs since 2010 and at present has an unexplained anemia and hypertriglyceridemia. She remains asymptomatic in terms of traditional complaints of fatigue, weakness, hair changes, weight gain, etc.   Plan:      Will recheck TFTs today.  As TSH returned abnormally high, I checked free T4, which was low normal.  Will plan to recheck TFTs in 3-6 months, if overt hypothyroidism, will plan to start therapy. As patient remains asymptomatic at this time, will not initiate therapy at present.

## 2012-04-26 NOTE — Assessment & Plan Note (Signed)
Pertinent Labs: Results for ZOIEE, WIMMER (MRN [id number]) as of 04/26/2012 13:48  Ref. Range 07/26/2009 01:48 12/06/2009 21:19 04/12/2010 05:59 11/06/2010 14:26 10/10/2011 04:25 12/30/2011 14:38 03/02/2012 09:29 04/21/2012 16:17  Glucose Latest Range: 70-99 mg/dL 83 83 91 87 782 (H)  956 (H)     Assessment: Patient historically has had intermittent episodes of hyperglycemia. With her family history of diabetes and since she is currently overweight, will need to continue to monitor closely for evidence of progression towards diabetes.  Plan:      Check A1c today.

## 2012-04-26 NOTE — Assessment & Plan Note (Signed)
Pertinent Labs: CBC:    Component Value Date/Time   WBC 6.4 04/21/2012 1617   HGB 11.6* 04/21/2012 1617   HCT 33.8* 04/21/2012 1617   PLT 295 04/21/2012 1617   MCV 83.0 04/21/2012 1617   NEUTROABS 4.8 10/10/2011 0425   LYMPHSABS 3.8 10/10/2011 0425   MONOABS 0.5 10/10/2011 0425   EOSABS 0.1 10/10/2011 0425   BASOSABS 0.1 10/10/2011 0425   Lab Results  Component Value Date   IRON 91 04/21/2012   TIBC 352 04/21/2012   FERRITIN 180 04/21/2012    Assessment: Cause of patient's anemia remains unclear, possibly AOCD although contributing chronic disease has not been identified. She is post-menopausal. She has had colonoscopy 2010 that was normal and last mammogram was also unremarkable (07/2011). However, the current labs do now show elevated TSH, therefore, potentially, hypothyroidism could be a contributing factor. Importantly, patient remains without symptoms.  Plan:      Anemia panel and CBC checked today.  Will follow-up on thyroid function tests.

## 2012-04-26 NOTE — Assessment & Plan Note (Signed)
Pertinent Data: BP Readings from Last 3 Encounters:  04/21/12 134/79  03/23/12 118/72  03/02/12 125/74    Basic Metabolic Panel:    Component Value Date/Time   NA 137 03/02/2012 0929   K 4.3 03/02/2012 0929   CL 103 03/02/2012 0929   CO2 24 03/02/2012 0929   BUN 12 03/02/2012 0929   CREATININE 0.66 03/02/2012 0929   CREATININE 0.46* 10/10/2011 0425   GLUCOSE 109* 03/02/2012 0929   CALCIUM 10.0 03/02/2012 0929    Assessment: Disease Control: controlled  Progress toward goals: at goal  Barriers to meeting goals: no barriers identified    Has been off of her metoprolol x 2 days with concern for contribution towards gum swelling.    Patient is compliant most of the time with prescribed medications.   Plan:  Restart Metoprolol.  D/C amlodipine, which is more likely the offending factor towards her gingival hyperplasia.  Continue to monitor with discontinuation of her amlodipine, if additional therapy will be needed in the future.  Have been continuing to avoid ACE inhibitors and thiazide diuretics at present for fear of worsening acute gouty flares - however, can reconsider if needed once baseline gout is stabilized.

## 2012-04-26 NOTE — Progress Notes (Signed)
Quick Note:  Trigs remain elevated and not at goal, therefore, will need to initiate fish oil therapy. She does not have insurance, therefore, will need to start just basic over the fish oil 1gram daily. Triage called patient and informed. Continue to encourage low fat diet. ______

## 2012-04-26 NOTE — Progress Notes (Deleted)
Quick Note:  Uric acid improved with initiation of allopurinol. Will continue as planned to escalate to 200 mg daily. ______

## 2012-05-03 ENCOUNTER — Telehealth: Payer: Self-pay | Admitting: Licensed Clinical Social Worker

## 2012-05-03 NOTE — Telephone Encounter (Signed)
[name] was referred to CSW for access job opportunities.  CSW placed call to [name].  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  CSW provided pt with option to obtain information over the phone or if pt wanted CSW to place information in the mail.  [name] requesting information via mail.  CSW mailed information today.

## 2012-06-17 ENCOUNTER — Other Ambulatory Visit: Payer: Self-pay | Admitting: *Deleted

## 2012-06-17 DIAGNOSIS — L309 Dermatitis, unspecified: Secondary | ICD-10-CM

## 2012-06-17 MED ORDER — LORATADINE 10 MG PO TABS: 10.0000 mg | ORAL_TABLET | Freq: Every day | ORAL | Status: DC

## 2012-06-17 NOTE — Telephone Encounter (Signed)
Rx called in 

## 2012-06-20 ENCOUNTER — Other Ambulatory Visit: Payer: Self-pay | Admitting: *Deleted

## 2012-06-21 MED ORDER — TRIAMCINOLONE ACETONIDE 0.1 % EX OINT: TOPICAL_OINTMENT | Freq: Two times a day (BID) | CUTANEOUS | Status: DC

## 2012-06-28 ENCOUNTER — Other Ambulatory Visit: Payer: Self-pay | Admitting: Internal Medicine

## 2012-06-28 DIAGNOSIS — Z1231 Encounter for screening mammogram for malignant neoplasm of breast: Secondary | ICD-10-CM

## 2012-07-07 ENCOUNTER — Encounter: Payer: Self-pay | Admitting: Internal Medicine

## 2012-07-07 ENCOUNTER — Ambulatory Visit (INDEPENDENT_AMBULATORY_CARE_PROVIDER_SITE_OTHER): Payer: No Typology Code available for payment source | Admitting: Internal Medicine

## 2012-07-07 VITALS — BP 158/81 | HR 58 | Temp 97.9°F | Ht 66.0 in | Wt 190.0 lb

## 2012-07-07 DIAGNOSIS — S99911A Unspecified injury of right ankle, initial encounter: Secondary | ICD-10-CM

## 2012-07-07 DIAGNOSIS — E038 Other specified hypothyroidism: Secondary | ICD-10-CM

## 2012-07-07 DIAGNOSIS — E781 Pure hyperglyceridemia: Secondary | ICD-10-CM

## 2012-07-07 DIAGNOSIS — K055 Other periodontal diseases: Secondary | ICD-10-CM

## 2012-07-07 DIAGNOSIS — K061 Gingival enlargement: Secondary | ICD-10-CM

## 2012-07-07 DIAGNOSIS — M109 Gout, unspecified: Secondary | ICD-10-CM

## 2012-07-07 DIAGNOSIS — I10 Essential (primary) hypertension: Secondary | ICD-10-CM

## 2012-07-07 DIAGNOSIS — S8990XA Unspecified injury of unspecified lower leg, initial encounter: Secondary | ICD-10-CM

## 2012-07-07 DIAGNOSIS — W2203XA Walked into furniture, initial encounter: Secondary | ICD-10-CM

## 2012-07-07 DIAGNOSIS — E039 Hypothyroidism, unspecified: Secondary | ICD-10-CM

## 2012-07-07 LAB — TRIGLYCERIDES: Triglycerides: 596 mg/dL — ABNORMAL HIGH (ref <150)

## 2012-07-07 MED ORDER — ALLOPURINOL 100 MG PO TABS: 200.0000 mg | ORAL_TABLET | Freq: Every day | ORAL | Status: DC

## 2012-07-07 MED ORDER — GEMFIBROZIL 600 MG PO TABS: 600.0000 mg | ORAL_TABLET | Freq: Two times a day (BID) | ORAL | Status: DC

## 2012-07-07 NOTE — Assessment & Plan Note (Signed)
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 As well, she is previously intolerant to HCTZ (had worsening of her gout). She is mildly bradycardic with HR 58, therefore, will not be further adjusting her Metoprolol.    This is the first time in recent history that her BP is elevated, therefore, I am not adjusting her medications today.  Patient is compliant most of the time with prescribed medications.   Plan:  continue current medications  Lifestyle modification emphasized - weight loss and DASH diet.  Will reassess in 2 weeks, if persistent elevation, will consider low dose ACE-I.  Educational resources provided:    Self management tools provided:

## 2012-07-07 NOTE — Assessment & Plan Note (Signed)
Assessment: Likely an ankle sprain in the setting of hitting the ottoman, symptoms are improving at this time, and there are no red flag symptoms. She does not have any ligamental or joint laxity.  Plan:      When necessary NSAIDs, warm packs.  Advised of red flag symptoms to prompt reevaluation.

## 2012-07-07 NOTE — Assessment & Plan Note (Addendum)
Pertinent Data: Lab Results  Component Value Date   TSH 5.883* 04/21/2012   Assessment: Subclinical hypothyroidism. She is not symptomatic at this time, however, does have the unexplained hypertriglyceridemia and unexplained anemia.  Plan:      Recheck TFTs today.   ADDENDUM TO PLAN AFTER LABS RESULTED: Pertinent Labs:  Ref. Range 07/26/2009  04/21/2012  07/07/2012   TSH Latest Range: 0.350-4.500 uIU/mL 3.986 5.883 (H) 5.099 (H)  Free T4 Latest Range: 0.80-1.80 ng/dL  3.08 6.57    Assessment: Patient continues to have subclinical hypothyroidism. In setting of her uncontrolled and difficult to treat hypertriglyceridemia, will attempt to treat this thyroid dysfunction and assess if improved control of trigs and thyroid function. As this is subclinical hypothyroidism, will start with the 1 mcg/kg/day, which approximately to 86 mcg --> will approximate to the dosage.  Plan:  Start levothyroxine 88 mcg daily.  Recheck TFTs in 6 weeks.

## 2012-07-07 NOTE — Assessment & Plan Note (Signed)
Pertinent Data:  Ref. Range 04/24/2009  12/30/2011 03/02/2012 04/21/2012   Uric Acid, Serum Latest Range: 2.4-7.0 mg/dL 16.1 (H) 9.6 (H) 8.5 (H) 6.1    Assessment: Symptoms stable at this time, and she has started regularly taking the increased dose of Allopurinol that was increased last visit. No evidence of acute flare.  Plan:      Continue current allopurinol dose - sent to health dept (with note not to fill until pt calls).  Check uric acid level today with goal < 6.

## 2012-07-07 NOTE — Assessment & Plan Note (Signed)
Assessment: Improving since cessation of amlodipine. No appreciable abscesses. She is not on other offending agents. However, she also has poor dentition at baseline, and gingival or periodontal disease could further be contributing.  Plan:      Continue to hold amlodipine.  Dental referral for continued evaluation - pending.

## 2012-07-07 NOTE — Patient Instructions (Addendum)
General Instructions:  Please follow-up at the clinic in 1 month, at which time we will reevaluate your blood pressure - OR, please follow-up in the clinic sooner if needed.  There have not been changes in your medications.    You are getting labs today, if they are abnormal I will give you a call.   If you have been started on new medication(s), and you develop symptoms concerning for allergic reaction, including, but not limited to, throat closing, tongue swelling, rash, please stop the medication immediately and call the clinic at [phone], and go to the ER.  If symptoms worsen, or new symptoms arise, please call the clinic or go to the ER.  PLEASE BRING ALL OF YOUR MEDICATIONS  IN A BAG TO YOUR NEXT APPOINTMENT   Treatment Goals:  Goals (1 Years of Data) as of 07/07/2012          As of Today 04/21/12 03/23/12 03/02/12 02/24/12     Blood Pressure    . Blood Pressure < 140/90  158/81 134/79 118/72 125/74 122/76      Progress Toward Treatment Goals:    Self Care Goals & Plans:  Self Care Goal 07/07/2012  Manage my medications take my medicines as prescribed; bring my medications to every visit; refill my medications on time  Monitor my health keep track of my weight; keep track of my blood pressure  Eat healthy foods drink diet soda or water instead of juice or soda; eat more vegetables; eat baked foods instead of fried foods; eat fruit for snacks and desserts  Be physically active find time in my schedule; take a walk every day       Care Management & Community Referrals:     LIFESTYLE TIPS TO HELP WITH YOUR BLOOD PRESSURE CONTROL  WEIGHT REDUCTION:  Strategies: A healthy weight loss program includes:  A calorie restricted diet based on individual calorie needs.   Increased physical activity (exercise).  An exercise program is just as important as the right low-calorie diet.    An unhealthy weight loss program includes:  Fasting.   Fad diets.    Supplements and drugs.  These choices do not succeed in long-term weight control.   Home Care Instructions: To help you make the needed dietary changes:   Exercise and perform physical activity as directed by your caregiver.   Keep a daily record of everything you eat. There are many free websites to help you with this. It may be helpful to measure your foods so you can determine if you are eating the correct portion sizes.   Use low-calorie cookbooks or take special cooking classes.   Avoid alcohol. Drink more water and drinks with no calories.   Take vitamins and supplements only as recommended by your caregiver.   Weight loss support groups, Registered Dieticians, counselors, and stress reduction education can also be very helpful.   ________________________________________________________________________  DASH DIET:  The DASH diet stands for "Dietary Approaches to Stop Hypertension." It is a healthy eating plan that has been shown to reduce high blood pressure (hypertension) in as little as 14 days, while also possibly providing other significant health benefits. These other health benefits include reducing the risk of breast cancer after menopause and reducing the risk of type 2 diabetes, heart disease, colon cancer, and stroke. Health benefits also include weight loss and slowing kidney failure in patients with chronic kidney disease.   Diet guidelines: Limit salt (sodium). Your diet should contain less than 1500 mg  of sodium daily.  Limit refined or processed carbohydrates. Your diet should include mostly whole grains. Desserts and added sugars should be used sparingly.  Include small amounts of heart-healthy fats. These types of fats include nuts, oils, and tub margarine. Limit saturated and trans fats. These fats have been shown to be harmful in the body.   Choosing Foods: The following food groups are based on a 2000 calorie diet. See your Registered Dietitian for  individual calorie needs.  Grains and Grain Products (6 to 8 servings daily)  Eat More Often: Whole-wheat bread, brown rice, whole-grain or wheat pasta, quinoa, popcorn without added fat or salt (air popped).  Eat Less Often: White bread, white pasta, white rice, cornbread.  Vegetables (4 to 5 servings daily)  Eat More Often: Fresh, frozen, and canned vegetables. Vegetables may be raw, steamed, roasted, or grilled with a minimal amount of fat.  Eat Less Often/Avoid: Creamed or fried vegetables. Vegetables in a cheese sauce.  Fruit (4 to 5 servings daily)  Eat More Often: All fresh, canned (in natural juice), or frozen fruits. Dried fruits without added sugar. One hundred percent fruit juice ( cup [237 mL] daily).  Eat Less Often: Dried fruits with added sugar. Canned fruit in light or heavy syrup.  [employer], Fish, and Poultry (2 servings or less daily. One serving is 3 to 4 oz [85-114 g]).  Eat More Often: Ninety percent or leaner ground beef, tenderloin, sirloin. Round cuts of beef, chicken breast, [place] breast. All fish. Grill, bake, or broil your meat. Nothing should be fried.  Eat Less Often/Avoid: Fatty cuts of meat, [place], or chicken leg, thigh, or wing. Fried cuts of meat or fish.  Dairy (2 to 3 servings)  Eat More Often: Low-fat or fat-free milk, low-fat plain or light yogurt, reduced-fat or part-skim cheese.  Eat Less Often/Avoid: Milk (whole, 2%, skim, or chocolate). Whole milk yogurt. Full-fat cheeses.  Nuts, Seeds, and Legumes (4 to 5 servings per week)  Eat More Often: All without added salt.  Eat Less Often/Avoid: Salted nuts and seeds, canned beans with added salt.  Fats and Sweets (limited)  Eat More Often: Vegetable oils, tub margarines without trans fats, sugar-free gelatin. Mayonnaise and salad dressings.  Eat Less Often/Avoid: Coconut oils, palm oils, butter, stick margarine, cream, half and half, cookies, candy, pie.

## 2012-07-07 NOTE — Assessment & Plan Note (Signed)
Pertinent Labs: Liver Function Tests:    Component Value Date/Time   AST 15 10/10/2011 0425   ALT 28 10/10/2011 0425   ALKPHOS 71 10/10/2011 0425   BILITOT 0.3 10/10/2011 0425   PROT 9.2* 10/10/2011 0425   ALBUMIN 4.6 10/10/2011 0425    Lipid Panel:     Component Value Date/Time   CHOL 196 04/21/2012 1617   TRIG 525* 04/21/2012 1617   HDL 45 04/21/2012 1617   CHOLHDL 4.4 04/21/2012 1617   VLDL NOT CALC 04/21/2012 1617   LDLCALC Not calculated due to Triglyceride >400.   04/21/2012 1617     Assessment: Disease Control: not controlled  Progress toward goals: improved  Barriers to meeting goals: is hesitant to use the fish oil, as she feels like she would choke (does NOT have dysphagia or odynophagia)    Unclear if she is taking fish oil regularly..   Patient is not fasting today - last ate at 8AM.  Patient is compliant most of the time with prescribed medications.    Plan:  continue current medications  Check triglyceride level today.  Encouraged to drink a lot of water with the fish oil and call me if actually experiencing dysphagia.

## 2012-07-07 NOTE — Progress Notes (Signed)
Patient: Jenna May   MRN: [id number]  DOB: 30-Sep-1955  PCP: Alexandria Lodge, DO  Subjective:    HPI: [name] is a 56 y.o. female with a PMHx of HTN, HLD, and dermatitis who presented to clinic today for the following:  1) HTN - Patient does not check blood pressure regularly at home. Currently taking metoprolol 25mg  BID. Patient misses doses 0 x per week on average. 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 denies headaches, dizziness, lightheadedness, chest pain, shortness of breath.  Does not request refills today.  2) HLD - currently on gemfibrozil, which was started in 01/2012 in the setting of hypertriglyceridemia with trigs > 600 at that time. She was then started on fish oil at our last visit in 03/2012 2/2 persistent high trigs. She states she is taking it regularly without missing doses and she is tolerating the medication well. However, she does get scared that she is going to choke on the fish oil, although she has not had an issue with this. Denies chest pain, difficulty breathing, palpitations, tachycardia, and muscle pains.   3) Gingival hyperplasia - During last visit, patient noted patients that over last few months, has experienced gingival swelling and hypertrophy - this was thought to be 2/2 Amlodipine - which was discontinued. She has noted improvement in the gingival hyperplasia. She was unable to followup with the dentist. Continues to deny associated bleeding of the gums, lymphadenopathy, abscesses, or abnormal drainage noted.   4) Ankle injury -  Patient describes a 2 weeks history of gradually improving, aching right ankle pain.  Exacerbating factors: weight bearing and prolonged walking. Alleviating factors: OTC NSAIDS and rest.  she admits to inciting injury, specifically states that she hit her ankle on her ottoman after which the pain started. Prior history of related  problems: no prior problems with this area in the past. denies associated locking, giving way and has not had resulted falls, giving way of the ankle, worsening swelling, redness, warmth.   5) Subclinical hypothyroidism - Current symptoms: none . Patient denies diarrhea, heat / cold intolerance, nervousness, palpitations and weight changes. Symptoms have been well-controlled.   Review of Systems: Per HPI.   Current Outpatient Medications: Medication Sig  . allopurinol (ZYLOPRIM) 100 MG tablet Take 2 tablets (200 mg total) by mouth daily.  . fish oil-omega-3 fatty acids 1000 MG capsule Take 1 capsule (1 g total) by mouth daily.  Marland Kitchen gemfibrozil (LOPID) 600 MG tablet Take 1 tablet (600 mg total) by mouth 2 (two) times daily. Take 30 minutes before breakfast and 30 minutes before dinner  . loratadine (CLARITIN) 10 MG tablet Take 1 tablet (10 mg total) by mouth daily.  . metoprolol tartrate (LOPRESSOR) 25 MG tablet Take 1 tablet (25 mg total) by mouth 2 (two) times daily.  Marland Kitchen triamcinolone ointment (KENALOG) 0.1 % Apply topically 2 (two) times daily.     Allergies  Allergen Reactions  . Amlodipine Other (See Comments)    Gingival hyperplasia    Past Medical History  Diagnosis Date  . Hypertension   . Tobacco abuse     Quit 2009, after approximately 15 pack year smoking history.  . Anxiety   . 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 Umbilical hernia     S/P repair approximately 1995  . Postmenopausal status   . Bartholin cyst 06/2003  . Normocytic anemia  Unclear etiology. Anemia panel in 03/2010 showing Iron 53 , TIBC 303 , iron saturation percentage 17, ferritin 197 , B12 and folate within normal limits.  . Gout     Past Surgical History  Procedure Date  . Umbilical hernia repair approximately 1995  . Appendectomy approximately 1991  . Total abdominal hysterectomy 03/1996    2/2 fibroids     Objective:    Physical Exam: Filed Vitals:    07/07/12 1415  BP: 158/81  Pulse: 58  Temp: 97.9 F (36.6 C)      General: Vital signs reviewed and noted. Well-developed, well-nourished, in no acute distress; alert, appropriate and cooperative throughout examination.  Head: Normocephalic, atraumatic.  Mouth: Gingival overgrowth noted in lower gums, no overt bleeding or evidence of abscess. This is significantly improved from prior. On right upper, has evidence of dental caries.   Lungs:  Normal respiratory effort. Clear to auscultation BL without crackles or wheezes.  Heart: RRR. S1 and S2 normal without gallop, murmur, or rubs.  Abdomen:  BS normoactive. Soft, Nondistended, non-tender.  No masses or organomegaly.  Extremities: No pretibial edema. Right medial ankle with joint line tenderness and overlying mild bruising, improving from prior per patient. Full range of motion, no overlying erythema, warmth, edema. No ligamental laxity appreciated. Full and equal dorsalis pedis pulses.    Assessment/ Plan:   Case and plan of care discussed with attending physician, Dr. Jonah Blue.

## 2012-07-12 MED ORDER — OMEGA-3 FATTY ACIDS 1000 MG PO CAPS: 2.0000 g | ORAL_CAPSULE | Freq: Every day | ORAL | Status: AC

## 2012-07-12 MED ORDER — LEVOTHYROXINE SODIUM 88 MCG PO TABS: 88.0000 ug | ORAL_TABLET | Freq: Every day | ORAL | Status: DC

## 2012-07-12 NOTE — Addendum Note (Signed)
Addended by: Priscella Mann on: 07/12/2012 03:49 PM   Modules accepted: Orders

## 2012-07-12 NOTE — Progress Notes (Signed)
Quick Note:  Triglycerides remain high. Will escalate Fish oil to 2 grams daily. As well, will treat the subclinical hypothyroidism and plan to recheck trigs and TFTs in 6 weeks. Patient was called and informed of results and expresses understanding of information provided. ______

## 2012-08-04 ENCOUNTER — Telehealth: Payer: Self-pay | Admitting: *Deleted

## 2012-08-04 NOTE — Telephone Encounter (Signed)
Will forward to Dr. Burtis Junes, who will be seeing the patient during her appt.  Thank you! - Johnette Abraham, D.O., 08/04/2012, 7:04 PM

## 2012-08-04 NOTE — Telephone Encounter (Signed)
Pt calls and states that for several days she has noticed a mole under her R breast/arm being red, swollen and draining a scant amt of foul clear fluid, tender. She has appt 1/13 and will keep that appt, she is ask if it becomes worse between now and her appt to please go to urg care, she is agreeable

## 2012-08-08 ENCOUNTER — Ambulatory Visit (INDEPENDENT_AMBULATORY_CARE_PROVIDER_SITE_OTHER): Payer: No Typology Code available for payment source | Admitting: Internal Medicine

## 2012-08-08 ENCOUNTER — Encounter: Payer: Self-pay | Admitting: Internal Medicine

## 2012-08-08 VITALS — BP 122/73 | HR 65 | Temp 97.6°F | Ht 66.0 in | Wt 190.0 lb

## 2012-08-08 DIAGNOSIS — I10 Essential (primary) hypertension: Secondary | ICD-10-CM

## 2012-08-08 DIAGNOSIS — L089 Local infection of the skin and subcutaneous tissue, unspecified: Secondary | ICD-10-CM

## 2012-08-08 MED ORDER — CLINDAMYCIN HCL 300 MG PO CAPS: 300.0000 mg | ORAL_CAPSULE | Freq: Three times a day (TID) | ORAL | Status: AC

## 2012-08-08 NOTE — Assessment & Plan Note (Addendum)
Lab Results  Component Value Date   NA 137 03/02/2012   K 4.3 03/02/2012   CL 103 03/02/2012   CO2 24 03/02/2012   BUN 12 03/02/2012   CREATININE 0.66 03/02/2012   CREATININE 0.46* 10/10/2011    BP Readings from Last 3 Encounters:  08/08/12 122/73  07/07/12 158/81  04/21/12 134/79    Assessment: Hypertension control:  controlled  Progress toward goals:  at goal Barriers to meeting goals:  no barriers identified  Plan: Hypertension treatment:  continue current medications - metoprolol 25 BID; amlodipine has been d/c in the past d/t gingival hyperplasia & intolerant to HCTZ d/t worsening gout.  If elevated in the future, PCP will consider low dose ACEI

## 2012-08-08 NOTE — Progress Notes (Signed)
Subjective:   Patient ID: Jenna May female   DOB: November 15, 1955 57 y.o.   MRN: [id number]  HPI: [name] is a 57 y.o. woman who returns to clinic today for f/u of HTN  Regarding HTN, has been compliant with metoprolol BID and denies any symptoms such has HA/SOB/dyspnea/CP.  She also complaints that a mole on the lateral aspect of right breast (anterior axillary line) has increased in size and is painful; Noticed 08/03/11 - noticed increased swelling and tenderness, never before, was larger but has been draining clear fluid (malodorous) and is thus decreasing in size.  Describes pain as burning sensation.  Has not taken any pain medications.    Past Medical History  Diagnosis Date  . Hypertension   . Tobacco abuse     Quit 2009, after approximately 15 pack year smoking history.  . Anxiety   . 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 Umbilical hernia     S/P repair approximately 1995  . Postmenopausal status   . Bartholin cyst 06/2003  . Normocytic anemia     Unclear etiology. Anemia panel in 03/2010 showing Iron 53 , TIBC 303 , iron saturation percentage 17, ferritin 197 , B12 and folate within normal limits.  . Gout    Current Outpatient Prescriptions  Medication Sig Dispense Refill  . allopurinol (ZYLOPRIM) 100 MG tablet Take 2 tablets (200 mg total) by mouth daily.  60 tablet  2  . fish oil-omega-3 fatty acids 1000 MG capsule Take 2 capsules (2 g total) by mouth daily.  60 capsule  5  . gemfibrozil (LOPID) 600 MG tablet Take 1 tablet (600 mg total) by mouth 2 (two) times daily. Take 30 minutes before breakfast and 30 minutes before dinner  60 tablet  2  . levothyroxine (SYNTHROID, LEVOTHROID) 88 MCG tablet Take 1 tablet (88 mcg total) by mouth daily. Take 30 minutes before breakfast. Do not take with any other meds.  30 tablet  1  . loratadine (CLARITIN) 10 MG tablet Take 1 tablet (10 mg total) by mouth daily.  90 tablet  1  . metoprolol  tartrate (LOPRESSOR) 25 MG tablet Take 1 tablet (25 mg total) by mouth 2 (two) times daily.  60 tablet  5  . triamcinolone ointment (KENALOG) 0.1 % Apply topically 2 (two) times daily.  80 g  0   Family History  Problem Relation Age of Onset  . Hypertension Mother   . Diabetes Father   . Hypertension Father   . Hypertension Sister   . Heart attack Brother 68   History   Social History  . Marital Status: Single    Spouse Name: N/A    Number of Children: N/A  . Years of Education: 12th grade   Occupational History  . uemployed    Social History Main Topics  . Smoking status: Former Smoker -- 0.4 packs/day for 30 years    Types: Cigarettes    Quit date: 04/11/2008  . Smokeless tobacco: Never Used  . Alcohol Use: No  . Drug Use: No  . Sexually Active: None   Other Topics Concern  . None   Social History Narrative   Unemployed. Intermittently worked, Engineer, mining, Copy. Graduated from high school, [employer].Single.No children.Orange card.   Review of Systems: Constitutional: Denies fever, chills, diaphoresis, appetite change and fatigue.  HEENT: Denies photophobia, eye pain, redness, hearing loss, ear pain, congestion, sore throat, rhinorrhea, sneezing, mouth sores, trouble  swallowing, neck pain, neck stiffness and tinnitus.   Respiratory: Denies SOB, DOE, cough, chest tightness,  and wheezing.   Cardiovascular: Denies chest pain, palpitations and leg swelling.  Gastrointestinal: Denies nausea, vomiting, abdominal pain, diarrhea, constipation, blood in stool and abdominal distention.  Genitourinary: Denies dysuria, urgency, frequency, hematuria, flank pain and difficulty urinating.  Musculoskeletal: Denies myalgias, back pain, joint swelling, arthralgias and gait problem.  Skin: per hpi  Neurological: Denies dizziness, seizures, syncope, weakness, light-headedness, numbness and headaches.   Objective:  Physical Exam: Filed Vitals:   08/08/12 1015   BP: 122/73  Pulse: 65  Temp: 97.6 F (36.4 C)  TempSrc: Oral  Height: 5\' 6"  (1.676 m)  Weight: 190 lb (86.183 kg)  SpO2: 100%   Constitutional: Vital signs reviewed.  Patient is an obese woman in no acute distress and cooperative with exam.  Head: Normocephalic and atraumatic Mouth: no erythema or exudates, MMM Eyes: PERRL, EOMI, conjunctivae normal, No scleral icterus.  Cardiovascular: RRR, S1 normal, S2 normal, no MRG, pulses symmetric and intact bilaterally Pulmonary/Chest: CTAB, no wheezes, rales, or rhonchi Abdominal: Soft. Non-tender, non-distended, bowel sounds are normal, no masses, organomegaly, or guarding present.  Hematology: no axillary adenopathy.  Neurological: A&O x3, Strength is normal and symmetric bilaterally, cranial nerve II-XII are grossly intact, no focal motor deficit, sensory intact to light touch bilaterally.  Skin: 2 inch diameter papule on lateral aspect of right breast in anterior axillary line under bra with erythema & tenderness and fluctuance; no obvious drainage but 1cm of surrounding induration Psychiatric: Normal mood and affect. speech and behavior is normal. Judgment and thought content normal. Cognition and memory are normal.   Assessment & Plan:  Case and care discussed with Dr. Dalphine Handing. Please see problem oriented charting for further details. Will do trial of clinda and if no improvement, patient will return for I&D.  Given location of infection, it is also important to note that patient is scheduled for mammo on 08/12/11.

## 2012-08-08 NOTE — Patient Instructions (Addendum)
-  I faxed your antibiotic prescription to the [facility] Department.  You will take clindamycin 300mg  three times daily for 7 days.  If in about 4-5 days after starting antibiotics you do not notice improvement, please call the clinic for an appointment.  You may take Aleve as needed for pain.  Your blood pressure looks great today!  Please be sure to bring all of your medications with you to every visit.  Should you have any new or worsening symptoms, please be sure to call the clinic at [phone].

## 2012-08-08 NOTE — Assessment & Plan Note (Signed)
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  Patient hesitant for I & D today, and I&D not indicated (except for symptom mgmt) if suppurativa hidradenitis.  Will treat with clinda 300 TID for 7 days, and if no improvement, patient will need to return for I&D.  She understands to call clinic if she has >5 water BMs/day while on clindamycin.  She was also instructed that she may take aleve for pain relief.

## 2012-08-11 ENCOUNTER — Ambulatory Visit (HOSPITAL_COMMUNITY): Payer: Self-pay

## 2012-10-24 ENCOUNTER — Ambulatory Visit: Payer: Self-pay

## 2012-12-05 ENCOUNTER — Other Ambulatory Visit: Payer: Self-pay | Admitting: *Deleted

## 2012-12-05 DIAGNOSIS — L309 Dermatitis, unspecified: Secondary | ICD-10-CM

## 2012-12-05 MED ORDER — LORATADINE 10 MG PO TABS: 10.0000 mg | ORAL_TABLET | Freq: Every day | ORAL | Status: DC

## 2012-12-08 ENCOUNTER — Ambulatory Visit (HOSPITAL_COMMUNITY)
Admission: RE | Admit: 2012-12-08 | Discharge: 2012-12-08 | Disposition: A | Discharge status: Home / Self Care | Payer: No Typology Code available for payment source | Source: Ambulatory Visit | Attending: Internal Medicine | Admitting: Internal Medicine

## 2012-12-08 DIAGNOSIS — Z1231 Encounter for screening mammogram for malignant neoplasm of breast: Secondary | ICD-10-CM | POA: Insufficient documentation

## 2013-03-01 ENCOUNTER — Other Ambulatory Visit: Payer: Self-pay | Admitting: *Deleted

## 2013-03-01 MED ORDER — TRIAMCINOLONE ACETONIDE 0.1 % EX OINT: TOPICAL_OINTMENT | Freq: Two times a day (BID) | CUTANEOUS | Status: DC

## 2013-03-08 ENCOUNTER — Other Ambulatory Visit: Payer: Self-pay | Admitting: *Deleted

## 2013-03-08 ENCOUNTER — Other Ambulatory Visit: Payer: Self-pay | Admitting: Internal Medicine

## 2013-03-08 DIAGNOSIS — I10 Essential (primary) hypertension: Secondary | ICD-10-CM

## 2013-03-08 MED ORDER — METOPROLOL TARTRATE 25 MG PO TABS: 25.0000 mg | ORAL_TABLET | Freq: Two times a day (BID) | ORAL | Status: DC

## 2013-03-09 ENCOUNTER — Other Ambulatory Visit: Payer: Self-pay | Admitting: Internal Medicine

## 2013-03-17 NOTE — Telephone Encounter (Signed)
Empty request 

## 2013-03-28 ENCOUNTER — Encounter: Payer: Self-pay | Admitting: Internal Medicine

## 2013-03-28 ENCOUNTER — Ambulatory Visit (INDEPENDENT_AMBULATORY_CARE_PROVIDER_SITE_OTHER): Payer: No Typology Code available for payment source | Admitting: Internal Medicine

## 2013-03-28 VITALS — BP 147/81 | HR 75 | Temp 98.2°F | Ht 66.0 in | Wt 183.8 lb

## 2013-03-28 DIAGNOSIS — E781 Pure hyperglyceridemia: Secondary | ICD-10-CM

## 2013-03-28 DIAGNOSIS — I10 Essential (primary) hypertension: Secondary | ICD-10-CM

## 2013-03-28 DIAGNOSIS — E039 Hypothyroidism, unspecified: Secondary | ICD-10-CM

## 2013-03-28 DIAGNOSIS — Z23 Encounter for immunization: Secondary | ICD-10-CM

## 2013-03-28 LAB — BASIC METABOLIC PANEL WITH GFR
CO2: 22 mEq/L (ref 19–32)
Chloride: 104 mEq/L (ref 96–112)
GFR, Est Non African American: >89 mL/min
Potassium: 3.7 mEq/L (ref 3.5–5.3)
Sodium: 137 mEq/L (ref 135–145)

## 2013-03-28 LAB — LIPID PANEL
Cholesterol: 217 mg/dL — ABNORMAL HIGH (ref 0–200)
Total CHOL/HDL Ratio: 5.7 Ratio
Triglycerides: 764 mg/dL — ABNORMAL HIGH (ref <150)

## 2013-03-28 NOTE — Patient Instructions (Signed)
We will be doing some blood work, we will let you know from the results if you will need to continue with your Gemfibrozil for your cholesterol levels which have been elevated in the past. Continue taking fish oil, and your medications for your blood pressure. Also cut down on the amount of water you drink before sleeping at night, at least 4 hrs before bed.

## 2013-03-28 NOTE — Progress Notes (Signed)
Patient ID: Jenna May, female   DOB: 01-04-1956, 57 y.o.   MRN: [id number]   Subjective:   Patient ID: Jenna May female   DOB: Feb 05, 1956 57 y.o.   MRN: [id number]  HPI: [name] is a 57 y.o. with PMH_ HTN, gingival hyperplasia, hypothyroidsm, hypertriglyceridemia, Gout. Presented today with no complaints today, but for refill of her medications.  1) HTN - Patient does not check blood pressure regularly at home. Currently taking metoprolol. Amlodipine was stopped because patinet developed gingival hyperplasia and it was thought to be tha offending agent. Gingival hyperplasia has mostly resolved, patient reports. Patient misses doses 0 x per week on average. denies headaches, dizziness, lightheadedness, chest pain, shortness of breath. does not request refills today. Blood pressure today- 147/81.  Vitals - 1 value per visit 08/08/2012 07/07/2012  SYSTOLIC 122 158  DIASTOLIC 73 81   2) HLD - currently on gemfibrozil, which was started in 01/2012 in the setting of hypertriglyceridemia with trigs > 600 at that time. She states she is taking it regularly without missing doses and she is tolerating the medication well. Has run out of medication and did not request a refill from the pharmacy. Complains that medication is too expensive and she cant afford it. Also says she is compliant with her fish oil.   3) Gout- No recent flares. Has not refilled her allorpurinol. Uric acid levels have dropped  To within normal limits from- 8.5 03/02/2012 to 5.9  Done-8 months ago.   4) Hypothyroidism- Last TSH- 5.099 07/07/2012. Patient was started on synthroid- 88mg  daily- same day. No c/o of constipation, cold intolerance or fatigue.   5)- Health screening- Up to date on all her screenings. Has has a total hysterectomy and so no pap smears done.  Past Medical History  Diagnosis Date  . Hypertension   . Tobacco abuse     Quit 2009, after approximately 15 pack year smoking history.  .  Anxiety   . 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 Umbilical hernia     S/P repair approximately 1995  . Postmenopausal status   . Bartholin cyst 06/2003  . Normocytic anemia     Unclear etiology. Anemia panel in 03/2010 showing Iron 53 , TIBC 303 , iron saturation percentage 17, ferritin 197 , B12 and folate within normal limits.  . Gout    Current Outpatient Prescriptions  Medication Sig Dispense Refill  . allopurinol (ZYLOPRIM) 100 MG tablet Take 2 tablets (200 mg total) by mouth daily.  60 tablet  2  . fish oil-omega-3 fatty acids 1000 MG capsule Take 2 capsules (2 g total) by mouth daily.  60 capsule  5  . gemfibrozil (LOPID) 600 MG tablet Take 1 tablet (600 mg total) by mouth 2 (two) times daily. Take 30 minutes before breakfast and 30 minutes before dinner  60 tablet  2  . levothyroxine (SYNTHROID, LEVOTHROID) 88 MCG tablet Take 1 tablet (88 mcg total) by mouth daily. Take 30 minutes before breakfast. Do not take with any other meds.  30 tablet  1  . loratadine (CLARITIN) 10 MG tablet Take 1 tablet (10 mg total) by mouth daily.  90 tablet  1  . metoprolol tartrate (LOPRESSOR) 25 MG tablet Take 1 tablet (25 mg total) by mouth 2 (two) times daily.  60 tablet  5  . triamcinolone ointment (KENALOG) 0.1 % Apply topically 2 (two) times daily.  80 g  1   No current  facility-administered medications for this visit.   Family History  Problem Relation Age of Onset  . Hypertension Mother   . Diabetes Father   . Hypertension Father   . Hypertension Sister   . Heart attack Brother 34   History   Social History  . Marital Status: Single    Spouse Name: N/A    Number of Children: N/A  . Years of Education: 12th grade   Occupational History  . uemployed    Social History Main Topics  . Smoking status: Former Smoker -- 0.40 packs/day for 30 years    Types: Cigarettes    Quit date: 04/11/2008  . Smokeless tobacco: Never Used  . Alcohol Use: No  .  Drug Use: No  . Sexual Activity: None   Other Topics Concern  . None   Social History Narrative   Unemployed. Intermittently worked, Engineer, mining, Copy.    Graduated from high school, [employer].   Single.   No children.   Or[place] card.   Review of Systems: CONSTITUTIONAL- No Fever, weightloss, night sweat, or change in appetite. SKIN- No Rash, colour changes,or itching. HEAD- No headache,or dizziness. RESPIRATORY- No Cough, or  SOB. CARDIAC- No Palpitations, DOE, PND, or chest pain. GI- No nausea, vomiting, diarrhoea, constipation,or  abd pain. URINARY- C/o of Frequency at night only, but says she drinks a lot of water. No dysuria. NEUROLOGIC- No Numbness, syncope,or burning. [facility]- Mood- happy, no symptoms of depression,or anxiety.  Objective:  Physical Exam: GENERAL- alert, co-operative, appears as stated age, not in any distress. HEENT- Atraumatic, normocephalic, PERRL, EOMI, oral mucosa appears dry. No cervical LN enlargement, thyroid does not appear enlarged, hyperpig of lower part of lower gums, otherwise appears normal. CARDIAC- RRR, no murmurs, rubs or gallops. RESP- Moving equal volumes of air, and clear to auscultation bilaterally. ABDOMEN- Soft,non tender,no palpable masses or organomegaly, bowel sounds present. BACK- Normal curvature of the spine, No tenderness along the vertebrae, no CVA tenderness. NEURO- Cr N 2-12 intact, strenght equal and present in all extremities,  EXTREMITIES- pulse 2+, symmetric. SKIN- Warm, dry, No rash or lesion. PSYCH- Normal mood and affect, appropriate thought content and speech.  Filed Vitals:   03/28/13 1340  BP: 147/81  Pulse: 75  Temp: 98.2 F (36.8 C)  TempSrc: Oral  Height: 5\' 6"  (1.676 m)  Weight: 183 lb 12.8 oz (83.371 kg)  SpO2: 100%    Assessment & Plan:  The patient's case and plan of care was discussed with attending physician, Dr. Irine Seal.  Hyperlipidemia- Lipid profile check today.  Pending results, Patient will most likley need to continue her gemfibrozil, as there is no other option to reduce her TG, as her LDL is normal. Also continue fish oil.  HTN- BMP check today- Advised to continue her medications as prescribed. Currently only on metoprolol. HCT discontinued due to worsening gout.   Hypothyroidism- though subclinical, On synthroid 88mg  daily, but TSH levels have not been assessed since commencement of treatment 9 mths ago.  Health screening- To get influenza vaccine today.  Patient also advised to reduce the amount of water she drinks before going to bed, as her urinary frequency occurs only at night. To continue with her medications as prescribed.

## 2013-03-29 LAB — TSH: TSH: 5.682 u[IU]/mL — ABNORMAL HIGH (ref 0.350–4.500)

## 2013-03-30 NOTE — Addendum Note (Signed)
Addended by: Remus Blake on: 03/30/2013 02:38 PM   Modules accepted: Orders

## 2013-03-31 NOTE — Progress Notes (Signed)
I saw and evaluated the patient.  I personally confirmed the key portions of the history and exam documented by Dr. Emokpae and I reviewed pertinent patient test results.  The assessment, diagnosis, and plan were formulated together and I agree with the documentation in the resident's note. 

## 2013-04-05 ENCOUNTER — Other Ambulatory Visit: Payer: Self-pay | Admitting: Internal Medicine

## 2013-04-05 DIAGNOSIS — E785 Hyperlipidemia, unspecified: Secondary | ICD-10-CM

## 2013-04-12 ENCOUNTER — Other Ambulatory Visit: Payer: Self-pay | Admitting: Internal Medicine

## 2013-04-12 DIAGNOSIS — E781 Pure hyperglyceridemia: Secondary | ICD-10-CM

## 2013-04-12 MED ORDER — GEMFIBROZIL 600 MG PO TABS: 600.0000 mg | ORAL_TABLET | Freq: Two times a day (BID) | ORAL | Status: DC

## 2013-04-13 NOTE — Progress Notes (Signed)
Quick Note:  Patient contacted about results of Lipid profile- Elevated TG and a prescription for Gemfibrozil sent to patients pharmacy. Patinet encourage to continue taking Fish oil also. ______

## 2013-05-22 ENCOUNTER — Ambulatory Visit: Payer: Self-pay

## 2013-07-24 ENCOUNTER — Other Ambulatory Visit: Payer: Self-pay | Admitting: Internal Medicine

## 2013-07-24 DIAGNOSIS — Z1231 Encounter for screening mammogram for malignant neoplasm of breast: Secondary | ICD-10-CM

## 2013-08-16 ENCOUNTER — Ambulatory Visit (INDEPENDENT_AMBULATORY_CARE_PROVIDER_SITE_OTHER): Payer: No Typology Code available for payment source | Admitting: Internal Medicine

## 2013-08-16 ENCOUNTER — Encounter: Payer: Self-pay | Admitting: Internal Medicine

## 2013-08-16 VITALS — BP 152/84 | HR 66 | Temp 97.9°F | Ht 66.0 in | Wt 191.1 lb

## 2013-08-16 DIAGNOSIS — R220 Localized swelling, mass and lump, head: Secondary | ICD-10-CM

## 2013-08-16 DIAGNOSIS — R221 Localized swelling, mass and lump, neck: Secondary | ICD-10-CM | POA: Insufficient documentation

## 2013-08-16 LAB — TSH: TSH: 3.178 u[IU]/mL (ref 0.350–4.500)

## 2013-08-16 LAB — T4, FREE: Free T4: 0.91 ng/dL (ref 0.80–1.80)

## 2013-08-16 MED ORDER — OMEPRAZOLE 20 MG PO CPDR: 20.0000 mg | DELAYED_RELEASE_CAPSULE | Freq: Two times a day (BID) | ORAL | Status: DC

## 2013-08-16 NOTE — Progress Notes (Signed)
Case discussed with Dr. Sadek soon after the resident saw the patient.  We reviewed the resident's history and exam and pertinent patient test results.  I agree with the assessment, diagnosis, and plan of care documented in the resident's note. 

## 2013-08-16 NOTE — Patient Instructions (Signed)
Gastroesophageal Reflux Disease, Adult  Gastroesophageal reflux disease (GERD) happens when acid from your stomach goes into your food pipe (esophagus). The acid can cause a burning feeling in your chest. Over time, the acid can make small holes (ulcers) in your food pipe.   HOME CARE  · Ask your doctor for advice about:  · Losing weight.  · Quitting smoking.  · Alcohol use.  · Avoid foods and drinks that make your problems worse. You may want to avoid:  · Caffeine and alcohol.  · Chocolate.  · Mints.  · Garlic and onions.  · Spicy foods.  · Citrus fruits, such as oranges, lemons, or limes.  · Foods that contain tomato, such as sauce, chili, salsa, and pizza.  · Fried and fatty foods.  · Avoid lying down for 3 hours before you go to bed or before you take a nap.  · Eat small meals often, instead of large meals.  · Wear loose-fitting clothing. Do not wear anything tight around your waist.  · Raise (elevate) the head of your bed 6 to 8 inches with wood blocks. Using extra pillows does not help.  · Only take medicines as told by your doctor.  · Do not take aspirin or ibuprofen.  GET HELP RIGHT AWAY IF:   · You have pain in your arms, neck, jaw, teeth, or back.  · Your pain gets worse or changes.  · You feel sick to your stomach (nauseous), throw up (vomit), or sweat (diaphoresis).  · You feel short of breath, or you pass out (faint).  · Your throw up is green, yellow, black, or looks like coffee grounds or blood.  · Your poop (stool) is red, bloody, or black.  MAKE SURE YOU:   · Understand these instructions.  · Will watch your condition.  · Will get help right away if you are not doing well or get worse.  Document Released: 12/30/2007 Document Revised: 10/05/2011 Document Reviewed: 01/30/2011  Exit[employer]® Patient Information ©2014 [employer].

## 2013-08-16 NOTE — Progress Notes (Signed)
   Subjective:    Patient ID: Jenna May, female    DOB: 04-26-1956, 58 y.o.   MRN: [id number]  HPI Jenna May is a 58 yo woman pmh as listed below presenting for throat swelling.   Patient states that this has been going on for anywhere between 2-3 months. She has a sensation of "throat uncomfortable numbness" not necessarily frank swelling and nothing that impinges her ability to eat or drink solid or liquids. She's not had any abdominal symptoms including nausea vomiting or diarrhea constipation, no alteration in her bowel habits or eating habits. She sometimes feels a sore throat worse in the morning and is not sure if she has a sour taste associated with this. She has noticed some weight gain but attributes that to dietary indiscretions. One of her concerns is that she was given the diagnosis of subclinical hypothyroidism and started on Synthroid about a year ago but has thus been off of her Synthroid for about a year. She has no family history of thyroid disease, has had no exposure to radiation, has not herself been treated previously for hyper or hypothyroidism before 9/14.   Review of Systems  Constitutional: Negative for fever, chills, appetite change and fatigue.  HENT: Positive for dental problem. Negative for congestion, facial swelling, mouth sores, postnasal drip, rhinorrhea, sore throat, trouble swallowing and voice change.   Respiratory: Negative for cough, choking and shortness of breath.   Cardiovascular: Negative for chest pain, palpitations and leg swelling.  Gastrointestinal: Negative for nausea, vomiting, abdominal pain, diarrhea and constipation.  Endocrine: Negative for cold intolerance and heat intolerance.  Musculoskeletal: Negative for neck pain.  Skin: Negative for rash.  Allergic/Immunologic: Negative for food allergies.  Neurological: Negative for dizziness, syncope and headaches.    Past Medical History  Diagnosis Date  . Hypertension   . Tobacco abuse      Quit 2009, after approximately 15 pack year smoking history.  . Anxiety   . 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 Umbilical hernia     S/P repair approximately 1995  . Postmenopausal status   . Bartholin cyst 06/2003  . Normocytic anemia     Unclear etiology. Anemia panel in 03/2010 showing Iron 53 , TIBC 303 , iron saturation percentage 17, ferritin 197 , B12 and folate within normal limits.  . Gout    Social, surgical, family history reviewed with patient and updated in appropriate chart locations.     Objective:   Physical Exam Filed Vitals:   08/16/13 0910  BP: 152/84  Pulse: 66  Temp:    General: sitting in chair, NAD HEENT: PERRL, EOMI, no scleral icterus, no thyromegaly or thyroid nodules palpated, no gingival hyperplasia, no PND, no oropharynx erythema or tonsillar hypertrophy, poor dentition with several visible caries Cardiac: RRR, no rubs, murmurs or gallops Pulm: clear to auscultation bilaterally, no crackles wheezes or rhonchi, moving normal volumes of air Abd: soft, nontender, nondistended, BS present Ext: warm and well perfused, no pedal edema Neuro: alert and oriented X3, cranial nerves II-XII grossly intact    Assessment & Plan:  Please see problem oriented charting  Pt discussed with Dr. Lynnae January

## 2013-08-16 NOTE — Assessment & Plan Note (Signed)
There is a broad differential that includes GERD, thyroid, dental, or oropharynx pathology. Per chart review patient's last TSH on 9/14 was slightly elevated at 5.6 the patient was not continued on Synthroid. There are no concerning physical exam findings to indicate thyromegaly at this time but that does not rule out thyroid pathology. She also may be experiencing GERD indicated by some of her symptoms being worse in the morning improving throughout the day plus/minus sour taste or cough. Will empirically treat for GERD and see if improved. Patient states that she is also had some sort of oral infection in the past which may be causing some inflammatory process although nothing significant on physical exam today. -TSH, free T4 -Omeprazole 20 mg twice a day -Dental clinic referral but patient wants to defer until can come up with co-pay

## 2013-08-30 ENCOUNTER — Other Ambulatory Visit: Payer: Self-pay | Admitting: *Deleted

## 2013-08-30 DIAGNOSIS — E781 Pure hyperglyceridemia: Secondary | ICD-10-CM

## 2013-08-30 MED ORDER — GEMFIBROZIL 600 MG PO TABS: 600.0000 mg | ORAL_TABLET | Freq: Two times a day (BID) | ORAL | Status: DC

## 2013-08-31 ENCOUNTER — Telehealth: Payer: Self-pay | Admitting: *Deleted

## 2013-08-31 NOTE — Telephone Encounter (Signed)
Fax from [facility] pt is not in MAP program; Lopid does not have a free program any longer.[facility] has Simvastatin 20mg  and 40mg  or Pravastatin 40mg  only. Please change and send new rx to [facility]. Thanks

## 2013-09-01 ENCOUNTER — Telehealth: Payer: Self-pay | Admitting: *Deleted

## 2013-09-01 NOTE — Telephone Encounter (Signed)
Pharmacy for [facility] they can no longer get lopid, they can get LOFIBRA, could you change this med to help pt financially?

## 2013-09-01 NOTE — Telephone Encounter (Signed)
Called to pharmacy 

## 2013-09-02 ENCOUNTER — Other Ambulatory Visit: Payer: Self-pay | Admitting: Internal Medicine

## 2013-09-02 MED ORDER — SIMVASTATIN 40 MG PO TABS: 40.0000 mg | ORAL_TABLET | Freq: Every day | ORAL | Status: DC

## 2013-09-02 NOTE — Telephone Encounter (Signed)
Will switch Lopid to Simvastatin. Thanks.  Bing Neighbors, MD.

## 2013-09-05 ENCOUNTER — Other Ambulatory Visit: Payer: Self-pay | Admitting: Internal Medicine

## 2013-09-05 MED ORDER — FENOFIBRATE MICRONIZED 134 MG PO CAPS: 134.0000 mg | ORAL_CAPSULE | Freq: Every day | ORAL | Status: DC

## 2013-09-05 NOTE — Telephone Encounter (Signed)
Ok, I just switched her medication to [facility] and signed the prescription. Please it will have to be phoned in. She is to take 134mg  daily. Thanks.   Bing Neighbors, MD.

## 2013-09-05 NOTE — Telephone Encounter (Signed)
The only med that they have is LOFIBRA, no lopid, no simvastatin

## 2013-09-06 NOTE — Telephone Encounter (Signed)
Called to pharm 

## 2013-10-23 ENCOUNTER — Other Ambulatory Visit: Payer: Self-pay | Admitting: *Deleted

## 2013-10-24 MED ORDER — TRIAMCINOLONE ACETONIDE 0.1 % EX OINT: TOPICAL_OINTMENT | Freq: Two times a day (BID) | CUTANEOUS | Status: DC

## 2013-11-14 ENCOUNTER — Encounter: Payer: Self-pay | Admitting: Internal Medicine

## 2013-11-14 ENCOUNTER — Ambulatory Visit: Payer: No Typology Code available for payment source

## 2013-11-14 ENCOUNTER — Ambulatory Visit (INDEPENDENT_AMBULATORY_CARE_PROVIDER_SITE_OTHER): Payer: No Typology Code available for payment source | Admitting: Internal Medicine

## 2013-11-14 VITALS — BP 152/77 | HR 63 | Temp 97.0°F | Ht 66.0 in | Wt 193.3 lb

## 2013-11-14 DIAGNOSIS — I10 Essential (primary) hypertension: Secondary | ICD-10-CM

## 2013-11-14 DIAGNOSIS — E781 Pure hyperglyceridemia: Secondary | ICD-10-CM

## 2013-11-14 DIAGNOSIS — E039 Hypothyroidism, unspecified: Secondary | ICD-10-CM

## 2013-11-14 DIAGNOSIS — E038 Other specified hypothyroidism: Secondary | ICD-10-CM

## 2013-11-14 LAB — COMPREHENSIVE METABOLIC PANEL
ALBUMIN: 4.6 g/dL (ref 3.5–5.2)
ALK PHOS: 56 U/L (ref 39–117)
ALT: 20 U/L (ref 0–35)
AST: 12 U/L (ref 0–37)
BUN: 15 mg/dL (ref 6–23)
CO2: 25 mEq/L (ref 19–32)
Calcium: 9.8 mg/dL (ref 8.4–10.5)
Chloride: 103 mEq/L (ref 96–112)
Creat: 0.67 mg/dL (ref 0.50–1.10)
Glucose, Bld: 84 mg/dL (ref 70–99)
Potassium: 3.8 mEq/L (ref 3.5–5.3)
Sodium: 137 mEq/L (ref 135–145)
Total Bilirubin: 0.5 mg/dL (ref 0.2–1.2)
Total Protein: 7.6 g/dL (ref 6.0–8.3)

## 2013-11-14 NOTE — Patient Instructions (Signed)
We will not be checking your cholesterol levels today. Please take the cholesterol medications as soon as you get them.   We will be checking your kidney function and liver function today.  Also take your blood pressure medications. If you feel lightheaded or dizzy, let us know.  Your test show that your Thyroid function is okay without medications.

## 2013-11-15 NOTE — Assessment & Plan Note (Signed)
Assessment- Pt says she has not been taking her synthroid in over a year. But TSH and T4 levels are within normal limits on no medication.  Pertinent Data- Results for Jenna May, Jenna May (MRN [id number]) as of 11/15/2013 01:58  Ref. Range 04/21/2012 16:31 07/07/2012 14:45 03/28/2013 14:30 08/16/2013 09:34 11/14/2013 16:56  TSH Latest Range: 0.350-4.500 uIU/mL  5.099 (H) 5.682 (H) 3.178   Free T4 Latest Range: 0.80-1.80 ng/dL  0.83  0.91    PLan- Can hold synthroid 43mcg daily for now. - Will repeat TSH and T4 in 3 months

## 2013-11-15 NOTE — Progress Notes (Signed)
INTERNAL MEDICINE TEACHING ATTENDING ADDENDUM - Aldine Contes, MD: I reviewed and discussed at the time of visit with the resident Dr. Denton Brick, the patient's medical history, physical examination, diagnosis and results of tests and treatment and I agree with the patient's care as documented.

## 2013-11-15 NOTE — Progress Notes (Signed)
Patient ID: Jenna May, female   DOB: 10-Aug-1955, 58 y.o.   MRN: [id number]   Subjective:   Patient ID: Jenna May female   DOB: April 08, 1956 58 y.o.   MRN: [id number]  HPI: Jenna May is a 58 y.o. PMH of Hypothyroidism, HTN and isolated Hypertriglyceridemia. Presented today for routine medical follow up and refill of meds. Please see problem based chatting for review of her current medical conditions.   Past Medical History  Diagnosis Date  . Hypertension   . Tobacco abuse     Quit 2009, after approximately 15 pack year smoking history.  . Anxiety   . 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 Umbilical hernia     S/P repair approximately 1995  . Postmenopausal status   . Bartholin cyst 06/2003  . Normocytic anemia     Unclear etiology. Anemia panel in 03/2010 showing Iron 53 , TIBC 303 , iron saturation percentage 17, ferritin 197 , B12 and folate within normal limits.  . Gout    Current Outpatient Prescriptions  Medication Sig Dispense Refill  . allopurinol (ZYLOPRIM) 100 MG tablet Take 2 tablets (200 mg total) by mouth daily.  60 tablet  2  . fenofibrate micronized (LOFIBRA) 134 MG capsule Take 1 capsule (134 mg total) by mouth daily before breakfast.  30 capsule  3  . fish oil-omega-3 fatty acids 1000 MG capsule Take 2 capsules (2 g total) by mouth daily.  60 capsule  5  . levothyroxine (SYNTHROID, LEVOTHROID) 88 MCG tablet Take 1 tablet (88 mcg total) by mouth daily. Take 30 minutes before breakfast. Do not take with any other meds.  30 tablet  1  . loratadine (CLARITIN) 10 MG tablet Take 1 tablet (10 mg total) by mouth daily.  90 tablet  1  . metoprolol tartrate (LOPRESSOR) 25 MG tablet Take 1 tablet (25 mg total) by mouth 2 (two) times daily.  60 tablet  5  . omeprazole (PRILOSEC) 20 MG capsule Take 1 capsule (20 mg total) by mouth 2 (two) times daily.  60 capsule  1  . triamcinolone ointment (KENALOG) 0.1 % Apply topically 2  (two) times daily.  80 g  2   No current facility-administered medications for this visit.   Family History  Problem Relation Age of Onset  . Hypertension Mother   . Diabetes Father   . Hypertension Father   . Hypertension Sister   . Heart attack Brother 27   History   Social History  . Marital Status: Single    Spouse Name: N/A    Number of Children: N/A  . Years of Education: 12th grade   Occupational History  . uemployed    Social History Main Topics  . Smoking status: Former Smoker -- 0.40 packs/day for 30 years    Types: Cigarettes    Quit date: 04/11/2008  . Smokeless tobacco: Never Used  . Alcohol Use: No  . Drug Use: No  . Sexual Activity: None   Other Topics Concern  . None   Social History Narrative   Unemployed. Intermittently worked, Museum/gallery curator, Doctor, hospital.    Graduated from high school, [employer].   Single.   No children.   Orange card.   Review of Systems: CONSTITUTIONAL- No Fever, weightloss, night sweat, or change in appetite. SKIN- No Rash, colour changes or itching. HEAD- No Headache, or dizziness. RESPIRATORY- No Cough, or SOB. CARDIAC- No Palpitations, DOE, PND, or chest pain.  GI- No Dysphagia, vomiting, diarrhoea, constipation, or abd pain. URINARY- No Frequency, polyuria, or dysuria. NEUROLOGIC- No Numbness, syncope or burning.  Objective:  Physical Exam: Filed Vitals:   11/14/13 1608  BP: 152/77  Pulse: 63  Temp: 97 F (36.1 C)  TempSrc: Oral  Height: 5\' 6"  (1.676 m)  Weight: 193 lb 4.8 oz (87.68 kg)  SpO2: 100%   GENERAL- alert, co-operative, appears as stated age, not in any distress. HEENT- Atraumatic, normocephalic, PERRL, EOMI, oral mucosa appears moist, no cervical LN enlargement, thyroid does not appear enlarged. CARDIAC- RRR, no murmurs, rubs or gallops. RESP- Moving equal volumes of air, and clear to auscultation bilaterally. ABDOMEN- Soft,non tender, no palpable masses or organomegaly, bowel  sounds present. BACK- Normal curvature of the spine, No tenderness along the vertebrae, no CVA tenderness. NEURO- No Obvious Cr n Abnormality, strenght 5/5 in all extremities EXTREMITIES- pulse 2+, symmetric, No pedal edema. SKIN- Warm, dry, No rash or lesion. PSYCH- Normal mood and affect, appropriate thought content and speech.  Assessment & Plan:  The patient's case and plan of care was discussed with attending physician, Dr. Orie Fisherman.  Please see problem based chatting for assessment and plan.

## 2013-11-15 NOTE — Assessment & Plan Note (Signed)
Assessment- TG Levels- 764 - 03/28/2013. Pt was sent a prescription for Gemfibrozil, but pts pharm had fenofibrate, and so med was switched. But pt has not been taking medication in months, said she was had trouble affording the medication. Pt says now she has just gotten the paper work that will enable her get her medication for free. And she plans to be complaint. Takes fish oil. Will not check levels today as pt has not been taking the medication.  Results for Jenna May, Jenna May (MRN [id number]) as of 11/15/2013 01:58  Ref. Range 04/21/2012 16:31 07/07/2012 14:45 03/28/2013 14:30 08/16/2013 09:34 11/14/2013 16:56  Cholesterol Latest Range: 0-200 mg/dL   217 (H)    Triglycerides Latest Range: <150 mg/dL  596 (H) 764 (H)    HDL Latest Range: >39 mg/dL   38 (L)    LDL (calc) Latest Range: 0-99 mg/dL   Pend    Direct LDL No range found   39    VLDL Latest Range: 0-40 mg/dL   NOT CALC    Total CHOL/HDL Ratio No range found   5.7     Plan- Lipid profile in 3 months- Next visit.

## 2013-11-15 NOTE — Assessment & Plan Note (Addendum)
Assessment-   BP Readings from Last 3 Encounters:  11/14/13 152/77  08/16/13 152/84  03/28/13 147/81    Lab Results  Component Value Date   NA 137 11/14/2013   K 3.8 11/14/2013   CREATININE 0.67 11/14/2013    Assessment:  Blood pressure control:  Controlled. Progress toward BP goal:   Not at goal Comments: sometimes compliant with her medication  Plan: Medications:  continue current medications- Metoprolol 25mg  BID Other plans: Encouraged to be more compliant with Meds                      Cmet today.

## 2013-12-11 ENCOUNTER — Ambulatory Visit (HOSPITAL_COMMUNITY)
Admission: RE | Admit: 2013-12-11 | Discharge: 2013-12-11 | Disposition: A | Discharge status: Home / Self Care | Payer: No Typology Code available for payment source | Source: Ambulatory Visit | Attending: Internal Medicine | Admitting: Internal Medicine

## 2013-12-11 DIAGNOSIS — Z1231 Encounter for screening mammogram for malignant neoplasm of breast: Secondary | ICD-10-CM | POA: Insufficient documentation

## 2014-01-09 ENCOUNTER — Encounter: Payer: Self-pay | Admitting: Internal Medicine

## 2014-01-09 ENCOUNTER — Ambulatory Visit (INDEPENDENT_AMBULATORY_CARE_PROVIDER_SITE_OTHER): Payer: No Typology Code available for payment source | Admitting: Internal Medicine

## 2014-01-09 VITALS — BP 120/30 | HR 80 | Temp 100.0°F | Ht 66.0 in | Wt 191.2 lb

## 2014-01-09 DIAGNOSIS — E038 Other specified hypothyroidism: Secondary | ICD-10-CM

## 2014-01-09 DIAGNOSIS — E781 Pure hyperglyceridemia: Secondary | ICD-10-CM

## 2014-01-09 DIAGNOSIS — I10 Essential (primary) hypertension: Secondary | ICD-10-CM

## 2014-01-09 DIAGNOSIS — N951 Menopausal and female climacteric states: Secondary | ICD-10-CM

## 2014-01-09 DIAGNOSIS — E039 Hypothyroidism, unspecified: Secondary | ICD-10-CM

## 2014-01-09 MED ORDER — SIMVASTATIN 40 MG PO TABS: 40.0000 mg | ORAL_TABLET | Freq: Every day | ORAL | Status: DC

## 2014-01-09 NOTE — Assessment & Plan Note (Signed)
LAst TSH- 3.178- 08/16/2013, on no medication. Will continue to hold Synthroid for now.  Plan- Repeat TSH at next visit.

## 2014-01-09 NOTE — Assessment & Plan Note (Addendum)
Pt currently having menopausal symptoms. Status post hysterectomy. Reassured. Educational Materials given to patient. Pt felt much better.

## 2014-01-09 NOTE — Assessment & Plan Note (Signed)
Patient still has not been able to get fenofibrate, can not afford the medication. Has been taking fish oil. Pt says she had to pay- $25 for a prescription, which she cannot afford.  Plan- Will switch medication to Simvastatin, though pts only CVD risk factor is HTN. But pt has not been able to get her fenobrate or gemfibrozil. Called in Simvastatin 40mg  daily. - See in 3 months to assess any improvement, in Lipid profile.

## 2014-01-09 NOTE — Progress Notes (Signed)
Patient ID: Jenna May, female   DOB: 1956/03/24, 58 y.o.   MRN: [id number]   Subjective:   Patient ID: Jenna May female   DOB: November 07, 1955 58 y.o.   MRN: [id number]  HPI: Ms.[name] is a 58 y.o. with PMH of HTN, Subclinical Hypothyroidsm, and hypertriglyceridemia. Presented today with c/o of Sudden sensation of heat in her body at night and sometimes during the day. Sudden, with sweating- night sweats, anxiety, and palpitations. These last less than a minute and wake her up at night Pt was very anxious. Was concerned something was very wrong. This had been going on for about a month. No fever, no headaches, no chest pain, no urinary symptoms, no dizziness. Pt had a hysterectomy ion 1997. Pt Mother started having menopausal symptoms in her late 50s.  Past Medical History  Diagnosis Date  . Hypertension   . Tobacco abuse     Quit 2009, after approximately 15 pack year smoking history.  . Anxiety   . 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 Umbilical hernia     S/P repair approximately 1995  . Postmenopausal status   . Bartholin cyst 06/2003  . Normocytic anemia     Unclear etiology. Anemia panel in 03/2010 showing Iron 53 , TIBC 303 , iron saturation percentage 17, ferritin 197 , B12 and folate within normal limits.  . Gout    Current Outpatient Prescriptions  Medication Sig Dispense Refill  . allopurinol (ZYLOPRIM) 100 MG tablet Take 2 tablets (200 mg total) by mouth daily.  60 tablet  2  . fish oil-omega-3 fatty acids 1000 MG capsule Take 2 capsules (2 g total) by mouth daily.  60 capsule  5  . levothyroxine (SYNTHROID, LEVOTHROID) 88 MCG tablet Take 1 tablet (88 mcg total) by mouth daily. Take 30 minutes before breakfast. Do not take with any other meds.  30 tablet  1  . loratadine (CLARITIN) 10 MG tablet Take 1 tablet (10 mg total) by mouth daily.  90 tablet  1  . metoprolol tartrate (LOPRESSOR) 25 MG tablet Take 1 tablet (25 mg total)  by mouth 2 (two) times daily.  60 tablet  5  . omeprazole (PRILOSEC) 20 MG capsule Take 1 capsule (20 mg total) by mouth 2 (two) times daily.  60 capsule  1  . simvastatin (ZOCOR) 40 MG tablet Take 1 tablet (40 mg total) by mouth daily.  30 tablet  3  . triamcinolone ointment (KENALOG) 0.1 % Apply topically 2 (two) times daily.  80 g  2   No current facility-administered medications for this visit.   Family History  Problem Relation Age of Onset  . Hypertension Mother   . Diabetes Father   . Hypertension Father   . Hypertension Sister   . Heart attack Brother 68   History   Social History  . Marital Status: Single    Spouse Name: N/A    Number of Children: N/A  . Years of Education: 12th grade   Occupational History  . uemployed    Social History Main Topics  . Smoking status: Former Smoker -- 0.40 packs/day for 30 years    Types: Cigarettes    Quit date: 04/11/2008  . Smokeless tobacco: Never Used  . Alcohol Use: No  . Drug Use: No  . Sexual Activity: None   Other Topics Concern  . None   Social History Narrative   Unemployed. Intermittently worked, Museum/gallery curator, Doctor, hospital.  Graduated from high school, [employer].   Single.   No children.   [place]ange card.   Review of Systems: CONSTITUTIONAL- No Fever, weightloss, no change in appetite, has night sweats. SKIN- No Rash, colour changes or itching. HEAD- No Headache or dizziness. EYES- No Vision loss, pain, redness, double or blurred vision. EARS- No vertigo, hearing loss or ear discharge. Mouth/throat- No Sorethroat, dentures, or bleeding gums. RESPIRATORY- No Cough or SOB. CARDIAC-Has Palpitations, no DOE, PND or chest pain. GI- No nausea, vomiting, diarrhoea, constipation, abd pain. URINARY- No Frequency, dysuria. NEUROLOGIC- No Numbness, syncope, seizures or burning. [facility]- Denies depression or anxiety.  Objective:  Physical Exam: Filed Vitals:   01/09/14 1548 01/09/14 1638  BP:  155/80 120/30  Pulse: 63 80  Temp: 100 F (37.8 C)   TempSrc: Oral   Height: 5\' 6"  (1.676 m)   Weight: 191 lb 3.2 oz (86.728 kg)   SpO2: 100%    GENERAL- alert, co-operative, appears as stated age, not in any distress. HEENT- Atraumatic, normocephalic, PERRL, EOMI, oral mucosa appears moist, neck supple. CARDIAC- RRR, no murmurs, rubs or gallops. RESP- Moving equal volumes of air, and clear to auscultation bilaterally, no wheezes or crackles. ABDOMEN- Soft, obese, nontender, no palpable masses or organomegaly, bowel sounds present. BACK- Normal curvature of the spine, No tenderness along the vertebrae, no CVA tenderness. NEURO- No obvious Cr N abnormality, strenght upper and lower extremities- 5/5, Gait- Normal. EXTREMITIES- pulse 2+, symmetric, no pedal edema. SKIN- Warm, dry, No rash or lesion. PSYCH- Normal mood and affect, appropriate thought content and speech.  Assessment & Plan:   The patient's case and plan of care was discussed with attending physician, Dr. Chinita Pester.  Please see problem based charting for assessment and plan.

## 2014-01-09 NOTE — Patient Instructions (Signed)
General Instructions: We will be prescribing a new medication for your cholesterol. Take it once a day, it is called Simvastatin.  Also you are going through menopause. Read the information below, it will help you understand.   Thank you for bringing your medicines today. This helps [place] keep you safe from mistakes.   Progress Toward Treatment Goals:  Treatment Goal 07/07/2012  Blood pressure deteriorated    Self Care Goals & Plans:  Self Care Goal 08/16/2013  Manage my medications take my medicines as prescribed  Monitor my health -  Eat healthy foods eat foods that are low in salt; eat more vegetables  Be physically active find an activity I enjoy; take a walk every day    Menopause Menopause is the normal time of life when menstrual periods stop completely. Menopause is complete when you have missed 12 consecutive menstrual periods. It usually occurs between the ages of 35 years and 76 years. Very rarely does a woman develop menopause before the age of 98 years. At menopause, your ovaries stop producing the female hormones estrogen and progesterone. This can cause undesirable symptoms and also affect your health. Sometimes the symptoms may occur 4 5 years before the menopause begins. There is no relationship between menopause and:  Oral contraceptives.  Number of children you had.  Race.  The age your menstrual periods started (menarche). Heavy smokers and very thin women may develop menopause earlier in life. CAUSES  The ovaries stop producing the female hormones estrogen and progesterone.  Other causes include:  Surgery to remove both ovaries.  The ovaries stop functioning for no known reason.  Tumors of the pituitary gland in the brain.  Medical disease that affects the ovaries and hormone production.  Radiation treatment to the abdomen or pelvis.  Chemotherapy that affects the ovaries. SYMPTOMS   Hot flashes, feeling your heart is racing, sweating, sudden heat  starting in your face and chest that might spread to your body, chills, anxiety, sleep disturbance.  Night sweats.  Decrease in sex drive.  Vaginal dryness and thinning of the vagina causing painful intercourse.  Dryness of the skin and developing wrinkles.  Headaches.  Tiredness.  Irritability.  Memory problems.  Weight gain.  Bladder infections.  Hair growth of the face and chest.  Infertility. More serious symptoms include:  Loss of bone (osteoporosis) causing breaks (fractures).  Depression.  Hardening and narrowing of the arteries (atherosclerosis) causing heart attacks and strokes. DIAGNOSIS   When the menstrual periods have stopped for 12 straight months.  Physical exam.  Hormone studies of the blood. TREATMENT  There are many treatment choices and nearly as many questions about them. The decisions to treat or not to treat menopausal changes is an individual choice made with your health care provider. Your health care provider can discuss the treatments with you. Together, you can decide which treatment will work best for you. Your treatment choices may include:   Hormone therapy (estrogen and progesterone).  Non-hormonal medicines.  Treating the individual symptoms with medicine (for example antidepressants for depression).  Herbal medicines that may help specific symptoms.  Counseling by a psychiatrist or psychologist.  Group therapy.  Lifestyle changes including:  Eating healthy.  Regular exercise.  Limiting caffeine and alcohol.  Stress management and meditation.  No treatment. HOME CARE INSTRUCTIONS   Take the medicine your health care provider gives you as directed.  Get plenty of sleep and rest.  Exercise regularly.  Eat a diet that contains calcium (good for  the bones) and soy products (acts like estrogen hormone).  Avoid alcoholic beverages.  Do not smoke.  If you have hot flashes, dress in layers.  Take supplements,  calcium, and vitamin D to strengthen bones.  You can use over-the-counter lubricants or moisturizers for vaginal dryness.  Group therapy is sometimes very helpful.  Acupuncture may be helpful in some cases. SEEK MEDICAL CARE IF:   You are not sure you are in menopause.  You are having menopausal symptoms and need advice and treatment.  You are still having menstrual periods after age 100 years.  You have pain with intercourse.  Menopause is complete (no menstrual period for 12 months) and you develop vaginal bleeding.  You need a referral to a specialist (gynecologist, psychiatrist, or psychologist) for treatment. SEEK IMMEDIATE MEDICAL CARE IF:   You have severe depression.  You have excessive vaginal bleeding.  You fell and think you have a broken bone.  You have pain when you urinate.  You develop leg or chest pain.  You have a fast pounding heart beat (palpitations).  You have severe headaches.  You develop vision problems.  You feel a lump in your breast.  You have abdominal pain or severe indigestion.

## 2014-01-09 NOTE — Assessment & Plan Note (Signed)
BP Readings from Last 3 Encounters:  01/09/14 120/30  11/14/13 152/77  08/16/13 152/84    Lab Results  Component Value Date   NA 137 11/14/2013   K 3.8 11/14/2013   CREATININE 0.67 11/14/2013    Assessment: Blood pressure control:  Controlled Progress toward BP goal:   At goal Comments: Compliant with Metoprolol 25mg  BID.  Plan: Medications:  continue current medications Educational resources provided:   Self management tools provided:   Other plans: None

## 2014-01-10 NOTE — Progress Notes (Signed)
Case discussed with Dr. Emokpae at the time of the visit.  We reviewed the resident's history and exam and pertinent patient test results.  I agree with the assessment, diagnosis, and plan of care documented in the resident's note. 

## 2014-02-08 ENCOUNTER — Other Ambulatory Visit: Payer: Self-pay | Admitting: *Deleted

## 2014-02-09 MED ORDER — METOPROLOL TARTRATE 25 MG PO TABS
25.0000 mg | ORAL_TABLET | Freq: Two times a day (BID) | ORAL | Status: DC
Start: ? — End: 2014-04-17

## 2014-03-20 ENCOUNTER — Other Ambulatory Visit: Payer: Self-pay | Admitting: *Deleted

## 2014-03-20 MED ORDER — ALLOPURINOL 100 MG PO TABS: 200.0000 mg | ORAL_TABLET | Freq: Every day | ORAL | Status: DC

## 2014-04-12 ENCOUNTER — Other Ambulatory Visit: Payer: Self-pay | Admitting: *Deleted

## 2014-04-12 ENCOUNTER — Ambulatory Visit: Payer: No Typology Code available for payment source | Admitting: Internal Medicine

## 2014-04-13 MED ORDER — TRIAMCINOLONE ACETONIDE 0.1 % EX OINT: TOPICAL_OINTMENT | Freq: Two times a day (BID) | CUTANEOUS | Status: DC

## 2014-04-17 ENCOUNTER — Encounter: Payer: Self-pay | Admitting: Internal Medicine

## 2014-04-17 ENCOUNTER — Ambulatory Visit (INDEPENDENT_AMBULATORY_CARE_PROVIDER_SITE_OTHER): Payer: No Typology Code available for payment source | Admitting: Internal Medicine

## 2014-04-17 VITALS — BP 140/69 | HR 65 | Temp 97.7°F | Ht 66.0 in | Wt 195.2 lb

## 2014-04-17 DIAGNOSIS — I10 Essential (primary) hypertension: Secondary | ICD-10-CM

## 2014-04-17 DIAGNOSIS — Z0000 Encounter for general adult medical examination without abnormal findings: Secondary | ICD-10-CM

## 2014-04-17 DIAGNOSIS — E781 Pure hyperglyceridemia: Secondary | ICD-10-CM

## 2014-04-17 DIAGNOSIS — Z23 Encounter for immunization: Secondary | ICD-10-CM

## 2014-04-17 DIAGNOSIS — E039 Hypothyroidism, unspecified: Secondary | ICD-10-CM

## 2014-04-17 LAB — LIPID PANEL
Cholesterol: 174 mg/dL (ref 0–200)
HDL: 42 mg/dL (ref >39)
Total CHOL/HDL Ratio: 4.1 Ratio
Triglycerides: 823 mg/dL — ABNORMAL HIGH (ref <150)

## 2014-04-17 LAB — TSH: TSH: 3.526 u[IU]/mL (ref 0.350–4.500)

## 2014-04-17 MED ORDER — LOVASTATIN 40 MG PO TABS: 40.0000 mg | ORAL_TABLET | Freq: Every day | ORAL | Status: DC

## 2014-04-17 MED ORDER — METOPROLOL TARTRATE 25 MG PO TABS: 25.0000 mg | ORAL_TABLET | Freq: Two times a day (BID) | ORAL | Status: DC

## 2014-04-17 NOTE — Progress Notes (Signed)
Patient ID: Jenna May, female   DOB: [birth year], 58 y.o.   MRN: [id number]   Subjective:   Patient ID: Jenna May female   DOB: 02/21/1956 58 y.o.   MRN: [id number]  HPI: [name] is a 58 y.o. with PMH of hypothyroidsm, HTN, hypertriglyceridemia. Presented today or routine follow up visit.  Past Medical History  Diagnosis Date  . Hypertension   . Tobacco abuse     Quit 2009, after approximately 15 pack year smoking history.  . Anxiety   . 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 Umbilical hernia     S/P repair approximately 1995  . Postmenopausal status   . Bartholin cyst 06/2003  . Normocytic anemia     Unclear etiology. Anemia panel in 03/2010 showing Iron 53 , TIBC 303 , iron saturation percentage 17, ferritin 197 , B12 and folate within normal limits.  . Gout    Current Outpatient Prescriptions  Medication Sig Dispense Refill  . allopurinol (ZYLOPRIM) 100 MG tablet Take 2 tablets (200 mg total) by mouth daily.  60 tablet  2  . fish oil-omega-3 fatty acids 1000 MG capsule Take 2 capsules (2 g total) by mouth daily.  60 capsule  5  . loratadine (CLARITIN) 10 MG tablet Take 1 tablet (10 mg total) by mouth daily.  90 tablet  1  . lovastatin (MEVACOR) 40 MG tablet Take 1 tablet (40 mg total) by mouth at bedtime.  30 tablet  6  . metoprolol tartrate (LOPRESSOR) 25 MG tablet Take 1 tablet (25 mg total) by mouth 2 (two) times daily.  60 tablet  6  . omeprazole (PRILOSEC) 20 MG capsule Take 1 capsule (20 mg total) by mouth 2 (two) times daily.  60 capsule  1  . triamcinolone ointment (KENALOG) 0.1 % Apply topically 2 (two) times daily.  80 g  2   No current facility-administered medications for this visit.   Family History  Problem Relation Age of Onset  . Hypertension Mother   . Diabetes Father   . Hypertension Father   . Hypertension Sister   . Heart attack Brother 29   History   Social History  . Marital Status: Single      Spouse Name: N/A    Number of Children: N/A  . Years of Education: 12th grade   Occupational History  . uemployed    Social History Main Topics  . Smoking status: Former Smoker -- 0.40 packs/day for 30 years    Types: Cigarettes    Quit date: 04/11/2008  . Smokeless tobacco: Never Used  . Alcohol Use: No  . Drug Use: No  . Sexual Activity: None   Other Topics Concern  . None   Social History Narrative   Unemployed. Intermittently worked, Museum/gallery curator, Doctor, hospital.    Graduated from high school, [employer].   Single.   No children.   [place] card.   Review of Systems: CONSTITUTIONAL- No Fever, weightloss, night sweat or change in appetite. SKIN- No Rash, colour changes or itching. HEAD- No Headache or dizziness. EYES- No Vision loss, pain, redness, double or blurred vision. EARS- No vertigo, hearing loss or ear discharge. Mouth/throat- No Sorethroat, dentures, or bleeding gums. RESPIRATORY- No Cough or SOB. CARDIAC- No Palpitations, DOE, PND or chest pain. GI- No nausea, vomiting, diarrhoea, constipation, abd pain. URINARY- No Frequency, urgency, straining or dysuria. NEUROLOGIC- No Numbness, syncope, seizures or burning. [facility]- Denies depression or anxiety.  Objective:  Physical Exam: Filed Vitals:   04/17/14 0922  BP: 140/69  Pulse: 65  Temp: 97.7 F (36.5 C)  TempSrc: Oral  Height: 5\' 6"  (1.676 m)  Weight: 195 lb 3.2 oz (88.542 kg)  SpO2: 100%   GENERAL- alert, pleasant lady, co-operative, appears as stated age, not in any distress. HEENT- Atraumatic, normocephalic, PERRL, EOMI, oral mucosa appears moist, no cervical LN enlargement, thyroid does not palpable, neck supple. CARDIAC- RRR, no murmurs, rubs or gallops. RESP- Moving equal volumes of air, and clear to auscultation bilaterally, no wheezes or crackles. ABDOMEN- Soft, nontender, no guarding or rebound, no palpable masses or organomegaly, bowel sounds present. BACK- Normal  curvature of the spine, No tenderness along the vertebrae, no CVA tenderness. NEURO- No obvious Cr N abnormality, strenght upper and lower extremities- 5/5, Gait- Normal. EXTREMITIES- pulse 2+, symmetric, no pedal edema. SKIN- Warm, dry, No rash or lesion. PSYCH- Normal mood and affect, appropriate thought content and speech.  Assessment & Plan:  The patient's case and plan of care was discussed with attending physician, Dr. Eppie Gibson.  Please see problem based charting for assessment and plan.

## 2014-04-17 NOTE — Assessment & Plan Note (Addendum)
Pt with complaints of right metacarpo-phalangeal joint swelling and pain for about a month. Minimal redness, but reduced range of motion due to pain. Previously usually has gout flares on her feet- usually left. No fever. Pain and swelling have reduced markedly since onset, pt is able to move thumb around without pain, but mild swelling still present today.  Pt doesnt want to try an medication for now, or do any imaging, as she is very afraid of cost and not been able to afford it. Pt says if it starts getting worse she will call for medication. Consider naproxen, as colchicine is expensive.  Last uric acid level- 06/2012- 5.9.  Complaint with allorpurinol- 200mg  daily.  Plan- Conservative management for now, encouraged pt to let us know if swelling worsens. Avoid red meat.

## 2014-04-17 NOTE — Assessment & Plan Note (Addendum)
BP Readings from Last 3 Encounters:  04/17/14 140/69  01/09/14 120/30  11/14/13 152/77    Lab Results  Component Value Date   NA 137 11/14/2013   K 3.8 11/14/2013   CREATININE 0.67 11/14/2013    Assessment: Blood pressure control:  Controlled Progress toward BP goal:   At goal Comments: On Metop 50mg  BID, complaint. No dizziness. Doesn't smoke cigs.  Plan: Medications:  continue current medications Educational resources provided: brochure Self management tools provided: home blood pressure logbook Other plans: Bmet today

## 2014-04-17 NOTE — Assessment & Plan Note (Signed)
Up to date on all her screenings. Flu shot today.

## 2014-04-17 NOTE — Assessment & Plan Note (Signed)
Switched Simvastatin 40 mg daily to lovastatin- 40mg  daily today, due to cost issues. Pt has been taking her statin, but says she wants something cheaper. Lovastatin on $ 4 [facility] list. Pt could not afford gemfibrozil or fenofibrate, so was discontinued.  Plan- Lovastatin- 40mg  daily - Lipid profile today. - Will continue fish oil.

## 2014-04-17 NOTE — Patient Instructions (Addendum)
General Instructions:   If the swelling in your hand gets worse, let us know I will call in  Medication called Naproxen for you. If that still doesn't help, we will have to for Xrays on your hand.   Please continue taking the Fish Oil and cholesterol medication. I will call in a cheaper one you can get at [facility], also called in your blood pressure medication to [facility], it is on the 4 dollar list.  Lovastatin- take 1 tablet once a day.   Thank you for bringing your medicines today. This helps [place] keep you safe from mistakes.     Exercise to Lose Weight Exercise and a healthy diet may help you lose weight. Your doctor may suggest specific exercises. EXERCISE IDEAS AND TIPS  Choose low-cost things you enjoy doing, such as walking, bicycling, or exercising to workout videos.  Take stairs instead of the elevator.  Walk during your lunch break.  Park your car further away from work or school.  Go to a gym or an exercise class.  Start with 5 to 10 minutes of exercise each day. Build up to 30 minutes of exercise 4 to 6 days a week.  Wear shoes with good support and comfortable clothes.  Stretch before and after working out.  Work out until you breathe harder and your heart beats faster.  Drink extra water when you exercise.  Do not do so much that you hurt yourself, feel dizzy, or get very short of breath. Exercises that burn about 150 calories:  Running 1  miles in 15 minutes.  Playing volleyball for 45 to 60 minutes.  Washing and waxing a car for 45 to 60 minutes.  Playing touch football for 45 minutes.  Walking 1  miles in 35 minutes.  Pushing a stroller 1  miles in 30 minutes.  Playing basketball for 30 minutes.  Raking leaves for 30 minutes.  Bicycling 5 miles in 30 minutes.  Walking 2 miles in 30 minutes.  Dancing for 30 minutes.  Shoveling snow for 15 minutes.  Swimming laps for 20 minutes.  Walking up stairs for 15 minutes.  Bicycling 4 miles  in 15 minutes.  Gardening for 30 to 45 minutes.  Jumping rope for 15 minutes.  Washing windows or floors for 45 to 60 minutes. Document Released: 08/15/2010 Document Revised: 10/05/2011 Document Reviewed: 08/15/2010 [facility] Patient Information 2015 [place], [place]. This information is not intended to replace advice given to you by your health care provider. Make sure you discuss any questions you have with your health care provider.

## 2014-04-17 NOTE — Assessment & Plan Note (Signed)
Has been off medications for ~1 year. Denies any symptoms- Tiredness, Cold intolerance, weight changes, or skin changes.  Plan- TSH today.

## 2014-04-18 NOTE — Addendum Note (Signed)
Addended by: Bethena Roys on: 04/18/2014 09:18 AM   Modules accepted: Orders

## 2014-04-18 NOTE — Progress Notes (Signed)
Case discussed with Dr. Emokpae soon after the resident saw the patient. We reviewed the resident's history and exam and pertinent patient test results. I agree with the assessment, diagnosis, and plan of care documented in the resident's note. 

## 2014-04-19 LAB — LDL CHOLESTEROL, DIRECT: LDL DIRECT: 42 mg/dL

## 2014-04-24 ENCOUNTER — Encounter: Payer: Self-pay | Admitting: Gastroenterology

## 2014-05-15 ENCOUNTER — Ambulatory Visit: Payer: No Typology Code available for payment source

## 2014-05-22 ENCOUNTER — Ambulatory Visit: Payer: Self-pay

## 2014-10-09 ENCOUNTER — Telehealth: Payer: Self-pay | Admitting: *Deleted

## 2014-10-09 NOTE — Telephone Encounter (Signed)
Pt called stating she is having a flair up of gout in her knee.  She is asking for a call so she knows what to take. She is c/o pain to this area. Pt # [id number]  She does have allopurinol in her med list. Would you rather have her seen?

## 2014-10-10 NOTE — Telephone Encounter (Signed)
I called pt to schedule the appointment and she wants to call back tomorrow for this.

## 2014-10-10 NOTE — Telephone Encounter (Signed)
Called patient back at about 11.30am. Told her she should come to the clinic so she can be assessed to rule out infection, and that it snot gout. She says he can not make it today or tomorrow, has used OTC NSAIDS with reduction in Pain and swelling she can now use the joint. Most likely gout, but she still needs to be seen. She says she will be here Friday. I stressed the importance of having an on -going infection treated as soon as its diagnosed. She voices understanding on the need to be seen, but says she will come to clinic on Friday, as she has absolutely no way to come to the clinic. She has not been taking her allorpurinol, told her to not restart that for now, till we say this is ok, as this can worse gout flare.  Jenna May.

## 2014-10-10 NOTE — Telephone Encounter (Signed)
I called pt again today and she feels some better.  The inflammation started in her toe and traveled up to her knee.  Now only the knee is bothering her.  She is still asking for Gout medicine. I explained Dr. Denton Brick wanted her evaluated before ordering meds. Pt states she can not walk good enough to get here. She was walking bent over but now can walk normal. She can also now stretch her leg out.   She has treated herself with Aleve and Ben-gay.   She would also like a call from you, as she can not come into clinic. # [phone]

## 2014-10-16 ENCOUNTER — Telehealth: Payer: Self-pay | Admitting: Internal Medicine

## 2014-10-16 NOTE — Telephone Encounter (Signed)
Call to patient to confirm appointment for 10/17/14 at 8:15 lmtcb

## 2014-10-17 ENCOUNTER — Encounter: Payer: Self-pay | Admitting: Internal Medicine

## 2014-10-17 ENCOUNTER — Ambulatory Visit (INDEPENDENT_AMBULATORY_CARE_PROVIDER_SITE_OTHER): Payer: Self-pay | Admitting: Internal Medicine

## 2014-10-17 VITALS — BP 138/68 | HR 69 | Temp 98.5°F | Wt 194.0 lb

## 2014-10-17 DIAGNOSIS — Z0000 Encounter for general adult medical examination without abnormal findings: Secondary | ICD-10-CM

## 2014-10-17 DIAGNOSIS — M109 Gout, unspecified: Secondary | ICD-10-CM

## 2014-10-17 DIAGNOSIS — M10061 Idiopathic gout, right knee: Secondary | ICD-10-CM

## 2014-10-17 DIAGNOSIS — I10 Essential (primary) hypertension: Secondary | ICD-10-CM

## 2014-10-17 MED ORDER — ALLOPURINOL 100 MG PO TABS: 200.0000 mg | ORAL_TABLET | Freq: Every day | ORAL | Status: DC

## 2014-10-17 MED ORDER — INDOMETHACIN 50 MG PO CAPS: 50.0000 mg | ORAL_CAPSULE | Freq: Three times a day (TID) | ORAL | Status: DC

## 2014-10-17 NOTE — Progress Notes (Signed)
Patient ID: Jenna May, female   DOB: [birth year], 57 y.o.   MRN: [id number]    Subjective:   Patient ID: Jenna May female   DOB: [birth year] 59 y.o.   MRN: [id number]  HPI: [name] is a 59 y.o. pleasant woman with past medical history of hypertension, non-tophaceous gout, subclinical hypothyroidism, chronic normocytic anemia, hypertriglyceridemia, and anxiety who presents with chief complaint of right knee pain.   She has chronic gout (confirmed with arthrocentesis) with last uric acid level of 5.9 on 07/08/11. She is noncompliant with taking allopurinol 200 mg daily since she ran out of it (had supply until December per refill history). She reports 1 week history of sudden onset of pain in her right great toe and ankle which then traveled to her right knee with significant swelling impairing her ability to ambulate. She denies fever, chills, erythema, or warmth. She reports frequently consuming red meat and seafood (salmon) with no alcohol or aspirin use. She is not on diuretic therapy. She has been taking alieve twice daily and applying bengay cream with significant improvement of her pain from 1 week ago. She been on indomethacin in the past for gout flares that help resolve them. She has no chronic kidney disease or history of gastric ulcers.    She is compliant with taking lopressor for hypertension and denies headache, blurry vision, chest pain, LE edema, or lightheadedness.     Past Medical History  Diagnosis Date  . Hypertension   . Tobacco abuse     Quit 2009, after approximately 15 pack year smoking history.  . Anxiety   . 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 Umbilical hernia     S/P repair approximately 1995  . Postmenopausal status   . Bartholin cyst 06/2003  . Normocytic anemia     Unclear etiology. Anemia panel in 03/2010 showing Iron 53 , TIBC 303 , iron saturation percentage 17, ferritin 197 , B12 and folate within  normal limits.  . Gout    Current Outpatient Prescriptions  Medication Sig Dispense Refill  . allopurinol (ZYLOPRIM) 100 MG tablet Take 2 tablets (200 mg total) by mouth daily. 60 tablet 2  . fish oil-omega-3 fatty acids 1000 MG capsule Take 2 capsules (2 g total) by mouth daily. 60 capsule 5  . loratadine (CLARITIN) 10 MG tablet Take 1 tablet (10 mg total) by mouth daily. 90 tablet 1  . lovastatin (MEVACOR) 40 MG tablet Take 1 tablet (40 mg total) by mouth at bedtime. 30 tablet 6  . metoprolol tartrate (LOPRESSOR) 25 MG tablet Take 1 tablet (25 mg total) by mouth 2 (two) times daily. 60 tablet 6  . omeprazole (PRILOSEC) 20 MG capsule Take 1 capsule (20 mg total) by mouth 2 (two) times daily. 60 capsule 1  . triamcinolone ointment (KENALOG) 0.1 % Apply topically 2 (two) times daily. 80 g 2   No current facility-administered medications for this visit.   Family History  Problem Relation Age of Onset  . Hypertension Mother   . Diabetes Father   . Hypertension Father   . Hypertension Sister   . Heart attack Brother 37   History   Social History  . Marital Status: Single    Spouse Name: N/A  . Number of Children: N/A  . Years of Education: 12th grade   Occupational History  . uemployed    Social History Main Topics  . Smoking status: Former Smoker -- 0.40 packs/day for  30 years    Types: Cigarettes    Quit date: 04/11/2008  . Smokeless tobacco: Never Used  . Alcohol Use: No  . Drug Use: No  . Sexual Activity: Not on file   Other Topics Concern  . None   Social History Narrative   Unemployed. Intermittently worked, Museum/gallery curator, Doctor, hospital.    Graduated from high school, [employer].   Single.   No children.   [place] card.   Review of Systems: Review of Systems  Constitutional: Negative for fever and chills.  HENT: Positive for congestion.   Eyes: Negative for blurred vision.  Respiratory: Negative for cough and shortness of breath.     Cardiovascular: Negative for chest pain and leg swelling.  Gastrointestinal: Negative for nausea, vomiting, abdominal pain, diarrhea and constipation.  Genitourinary: Negative for dysuria, urgency and frequency.  Musculoskeletal: Positive for joint pain (right knee pain ). Negative for myalgias.  Neurological: Negative for dizziness and headaches.    Objective:  Physical Exam: Filed Vitals:   10/17/14 0827  BP: 138/68  Pulse: 69  Temp: 98.5 F (36.9 C)  TempSrc: Oral  Weight: 194 lb (87.998 kg)  SpO2: 100%    Physical Exam  Constitutional: She is oriented to person, place, and time. She appears well-developed and well-nourished. No distress.  HENT:  Head: Normocephalic and atraumatic.  Eyes: EOM are normal.  Neck: Normal range of motion. Neck supple.  Cardiovascular: Normal rate and regular rhythm.   Pulmonary/Chest: Effort normal and breath sounds normal. No respiratory distress. She has no wheezes. She has no rales.  Abdominal: Soft. Bowel sounds are normal. She exhibits no distension. There is no tenderness. There is no rebound and no guarding.  Musculoskeletal: Normal range of motion. She exhibits edema (Mild of right knee) and tenderness (mild of right knee).  Normal ROM of right knee. No erythema or warmth.   Neurological: She is alert and oriented to person, place, and time.  Skin: Skin is warm and dry. No rash noted. She is not diaphoretic. No erythema. No pallor.  Psychiatric: She has a normal mood and affect. Her behavior is normal. Judgment and thought content normal.    Assessment & Plan:   Please see problem list for problem-based assessment and plan

## 2014-10-17 NOTE — Patient Instructions (Addendum)
-  Start taking indomethacin 50 mg three times daily until your gout flare resolves, don't take alieve  -Cut back on eating red meats and seafood and do not take aspirin  -Start taking allopurinol 200 mg daily 2 weeks after your flare resolves  -Please come back in 1 month to have your uric acid levels rechecked  -Nice seeing you today!  Gout Gout is when your joints become red, sore, and swell (inflamed). This is caused by the buildup of uric acid crystals in the joints. Uric acid is a chemical that is normally in the blood. If the level of uric acid gets too high in the blood, these crystals form in your joints and tissues. Over time, these crystals can form into masses near the joints and tissues. These masses can destroy bone and cause the bone to look misshapen (deformed). HOME CARE   Do not take aspirin for pain.  Only take medicine as told by your doctor.  Rest the joint as much as you can. When in bed, keep sheets and blankets off painful areas.  Keep the sore joints raised (elevated).  Put warm or cold packs on painful joints. Use of warm or cold packs depends on which works best for you.  Use crutches if the painful joint is in your leg.  Drink enough fluids to keep your pee (urine) clear or pale yellow. Limit alcohol, sugary drinks, and drinks with fructose in them.  Follow your diet instructions. Pay careful attention to how much protein you eat. Include fruits, vegetables, whole grains, and fat-free or low-fat milk products in your daily diet. Talk to your doctor or dietitian about the use of coffee, vitamin C, and cherries. These may help lower uric acid levels.  Keep a healthy body weight. GET HELP RIGHT AWAY IF:   You have watery poop (diarrhea), throw up (vomit), or have any side effects from medicines.  You do not feel better in 24 hours, or you are getting worse.  Your joint becomes suddenly more tender, and you have chills or a fever. MAKE SURE YOU:   Understand  these instructions.  Will watch your condition.  Will get help right away if you are not doing well or get worse. Document Released: 04/21/2008 Document Revised: 11/27/2013 Document Reviewed: 02/24/2012 [facility] Patient Information 2015 [place], [place]. This information is not intended to replace advice given to you by your health care provider. Make sure you discuss any questions you have with your health care provider.    General Instructions:   Please bring your medicines with you each time you come to clinic.  Medicines may include prescription medications, over-the-counter medications, herbal remedies, eye drops, vitamins, or other pills.   Progress Toward Treatment Goals:  Treatment Goal 07/07/2012  Blood pressure deteriorated    Self Care Goals & Plans:  Self Care Goal 10/17/2014  Manage my medications take my medicines as prescribed; bring my medications to every visit; refill my medications on time  Monitor my health keep track of my blood pressure  Eat healthy foods eat more vegetables; eat foods that are low in salt; eat baked foods instead of fried foods  Be physically active find an activity I enjoy; take a walk every day    No flowsheet data found.   Care Management & Community Referrals:  No flowsheet data found.

## 2014-10-18 MED ORDER — COLCHICINE 0.6 MG PO TABS: 0.6000 mg | ORAL_TABLET | Freq: Every day | ORAL | Status: DC

## 2014-10-18 MED ORDER — ALLOPURINOL 100 MG PO TABS: 100.0000 mg | ORAL_TABLET | Freq: Every day | ORAL | Status: DC

## 2014-10-18 MED ORDER — NAPROXEN 500 MG PO TABS: 250.0000 mg | ORAL_TABLET | Freq: Two times a day (BID) | ORAL | Status: DC

## 2014-10-18 NOTE — Assessment & Plan Note (Signed)
Assessment: Pt with well-controlled hypertension complaint with one-class (BB) anti-hypertensive therapy who presents with blood pressure of 136/68.   Plan:  -BP 138/68 at goal <140/90 -Continue metoprolol tartrate 25 mg BID  -Last CMP on 11/14/13 was normal

## 2014-10-18 NOTE — Addendum Note (Signed)
Addended byJuluis Mire on: 10/18/2014 06:20 PM   Modules accepted: Orders, Medications

## 2014-10-18 NOTE — Assessment & Plan Note (Signed)
Obtain screening HIV Ab at next visit.

## 2014-10-18 NOTE — Assessment & Plan Note (Addendum)
Assessment: Pt with chronic non-tophaceous gout with last uric acid level of 5.9 on 07/07/12 noncompliant with uric acid lowering therapy who presents with acute flare in setting of dietary indiscretion.   Plan: -Prescribe indomethacin 50 mg TID with meals until flare resolves -Pt instructed to start taking allopurinol 200 mg daily 2 weeks after flare resolves -Obtain uric acid level in 1 month after resolution of flare and adjust allopurinol for goal <6   -Pt instructed to limit red meat/seafood intake and avoid aspirin use  ADDENDUM: Pt to start naproxen 250 mg BID with allopurinol 100 mg daily two weeks after flare resolves since she has been off of uric acid lowering therapy for at least 3 months. Pt cannot afford colchicine.

## 2014-10-18 NOTE — Progress Notes (Signed)
Internal Medicine Clinic Attending  Case discussed with Dr. Naaman Plummer soon after the resident saw the patient.  We reviewed the resident's history and exam and pertinent patient test results.  I agree with the assessment, diagnosis, and plan of care documented in the resident's note. Clarifying with Dr Naaman Plummer whether she plans to cont prophylaxis for 6 months while resuming the allopurinol.

## 2014-11-07 ENCOUNTER — Other Ambulatory Visit: Payer: Self-pay | Admitting: *Deleted

## 2014-11-08 MED ORDER — TRIAMCINOLONE ACETONIDE 0.1 % EX OINT: TOPICAL_OINTMENT | Freq: Two times a day (BID) | CUTANEOUS | Status: DC

## 2014-11-13 ENCOUNTER — Ambulatory Visit: Payer: Self-pay

## 2014-11-13 ENCOUNTER — Encounter: Payer: Self-pay | Admitting: Internal Medicine

## 2014-11-13 ENCOUNTER — Ambulatory Visit (INDEPENDENT_AMBULATORY_CARE_PROVIDER_SITE_OTHER): Payer: Self-pay | Admitting: Internal Medicine

## 2014-11-13 VITALS — BP 161/77 | HR 77 | Temp 98.8°F | Ht 66.0 in | Wt 194.7 lb

## 2014-11-13 DIAGNOSIS — E785 Hyperlipidemia, unspecified: Secondary | ICD-10-CM

## 2014-11-13 DIAGNOSIS — M1000 Idiopathic gout, unspecified site: Secondary | ICD-10-CM

## 2014-11-13 DIAGNOSIS — E038 Other specified hypothyroidism: Secondary | ICD-10-CM

## 2014-11-13 DIAGNOSIS — M1A0610 Idiopathic chronic gout, right knee, without tophus (tophi): Secondary | ICD-10-CM

## 2014-11-13 DIAGNOSIS — I10 Essential (primary) hypertension: Secondary | ICD-10-CM

## 2014-11-13 DIAGNOSIS — E039 Hypothyroidism, unspecified: Secondary | ICD-10-CM

## 2014-11-13 DIAGNOSIS — Z0000 Encounter for general adult medical examination without abnormal findings: Secondary | ICD-10-CM

## 2014-11-13 DIAGNOSIS — D649 Anemia, unspecified: Secondary | ICD-10-CM

## 2014-11-13 DIAGNOSIS — M109 Gout, unspecified: Secondary | ICD-10-CM

## 2014-11-13 DIAGNOSIS — E781 Pure hyperglyceridemia: Secondary | ICD-10-CM

## 2014-11-13 LAB — COMPLETE METABOLIC PANEL WITH GFR
ALT: 18 U/L (ref 0–35)
AST: 11 U/L (ref 0–37)
Albumin: 4.2 g/dL (ref 3.5–5.2)
Alkaline Phosphatase: 59 U/L (ref 39–117)
BUN: 17 mg/dL (ref 6–23)
CO2: 18 meq/L — AB (ref 19–32)
Calcium: 9.7 mg/dL (ref 8.4–10.5)
Chloride: 108 mEq/L (ref 96–112)
Creat: 0.63 mg/dL (ref 0.50–1.10)
GFR, Est Non African American: >89 mL/min
GLUCOSE: 93 mg/dL (ref 70–99)
Potassium: 4.1 mEq/L (ref 3.5–5.3)
SODIUM: 140 meq/L (ref 135–145)
TOTAL PROTEIN: 7.4 g/dL (ref 6.0–8.3)
Total Bilirubin: 0.4 mg/dL (ref 0.2–1.2)

## 2014-11-13 LAB — URIC ACID: URIC ACID, SERUM: 9.1 mg/dL — AB (ref 2.4–7.0)

## 2014-11-13 MED ORDER — LOVASTATIN 40 MG PO TABS: 40.0000 mg | ORAL_TABLET | Freq: Every day | ORAL | Status: DC

## 2014-11-13 NOTE — Assessment & Plan Note (Signed)
HIV test today. Hepatitis Acute panel today. Had hysterectomy in 1997 for fibroids- she has had her cervix taken out and so dose not require PAP smears.

## 2014-11-13 NOTE — Assessment & Plan Note (Addendum)
To have resolved. Last episode involved her knee- right, she completed Indomethacin for 2 weeks, then took, naproxen and started alorpurinol. Today she is saying the Alorpurinol gives her loose stools, no abdominal pain or vomiting, no blood in stools, started about 2 weeks ago since she started med.Marland Kitchen She was on allorpurinol in the past and tolerated this well./ Diarrhea is a known side effect of this medication. Pt says she has 2 loose stools at night and one early in the morning. She says she is tolerating this well though and doesnt mind the loose stools.   Plan- Pt to stop taking teh naproxen fr now, she has been taking NSAIDS daily for over three weeks.  - Cont Allorpurinol, at 100mg  daily,  - Pt counselled there are other options like Febuxostat, considering she has no insurance might cost more, she wants to cont Allorpurinol for now.  If loose stools are intolerable, will switch therapy. - Uric acid check today. - Take NSAIDs for acute gout flares.   Addendum- 11/14/2014- Patient called about uric acid level of 9.2. She has been taking 100mg  of allorpurinol, will increase dose to 200mg  daily. See back in 5 weeks to check uric acid levels and readjust meds. Also check BP on that visit, pt told to bring blood pressure log. Pt voiced understanding.

## 2014-11-13 NOTE — Assessment & Plan Note (Signed)
Denies any symptoms of hypothyroidsm, incliding hair loss, skin changes, depression, low energy. LAst TSH_ 9/23/22015- normal at 3.5. Will continue to monitor.

## 2014-11-13 NOTE — Assessment & Plan Note (Addendum)
BP Readings from Last 3 Encounters:  11/13/14 161/77  10/17/14 138/68  04/17/14 140/69    Lab Results  Component Value Date   NA 137 11/14/2013   K 3.8 11/14/2013   CREATININE 0.67 11/14/2013    Assessment: Blood pressure control:  Moderately elevated today.  Progress toward BP goal:   Not at goal Comments: Compliant with metoprolol 25mg  BID, does admit to recently eating meals with lot sof salt. Which she normally doesn't do, has also been on NSAIDS, which we will be stopping.  Plan: Medications:  Will continue current meds. Pt Bp is usually well controlled.  Educational resources provided:   Self management tools provided:   Other plans: CMET today. - Pt encouraged to check her BP once a day for the next 1-2  Weeks. BP target- 754H- 606V systolic, and if more elevated than this she needs to see [place] within a month, pt voiced understanding, she has a home Bp device. - Reduce salt in diet. - remote hx of smoking.

## 2014-11-13 NOTE — Patient Instructions (Addendum)
It was nice seeing you today. Your cholesterol levels have been very high, we will like you to continue taking the Fish oil and the new medication we have re-prescribed called Lovastatin. This will reduce your risk of having a Heart attack and help reduce your cholesterol levels.  For now, you can stop taking the Naproxen. Please continue taking the Alorpurinol, this will reduce your risk of having another Gout attack. Also if the diarrhea from, the allorpurinol become unbearable, we will look for other options for you. The Naproxen/Alieve should be used if you have a new episode of knee swelling.  To help reduce your cholesterol level, reduce fast foods and fried foods. Also see the information below.   We will see you again in 6 months.    High Cholesterol High cholesterol refers to having a high level of cholesterol in your blood. Cholesterol is a white, waxy, fat-like protein that your body needs in small amounts. Your liver makes all the cholesterol you need. Excess cholesterol comes from the food you eat. Cholesterol travels in your bloodstream through your blood vessels. If you have high cholesterol, deposits (plaque) may build up on the walls of your blood vessels. This makes the arteries narrower and stiffer. Plaque increases your risk of heart attack and stroke. Work with your health care provider to keep your cholesterol levels in a healthy range. RISK FACTORS Several things can make you more likely to have high cholesterol. These include:   Eating foods high in animal fat (saturated fat) or cholesterol.  Being overweight.  Not getting enough exercise.  Having a family history of high cholesterol. SIGNS AND SYMPTOMS High cholesterol does not cause symptoms. DIAGNOSIS  Your health care provider can do a blood test to check whether you have high cholesterol. If you are older than 20, your health care provider may check your cholesterol every 4-6 years. You may be checked more often  if you already have high cholesterol or other risk factors for heart disease. The blood test for cholesterol measures the following:  Bad cholesterol (LDL cholesterol). This is the type of cholesterol that causes heart disease. This number should be less than 100.  Good cholesterol (HDL cholesterol). This type helps protect against heart disease. A healthy level of HDL cholesterol is 60 or higher.  Total cholesterol. This is the combined number of LDL cholesterol and HDL cholesterol. A healthy number is less than 200. TREATMENT  High cholesterol can be treated with diet changes, lifestyle changes, and medicine.   Diet changes may include eating more whole grains, fruits, vegetables, nuts, and fish. You may also have to cut back on red meat and foods with a lot of added sugar.  Lifestyle changes may include getting at least 40 minutes of aerobic exercise three times a week. Aerobic exercises include walking, biking, and swimming. Aerobic exercise along with a healthy diet can help you maintain a healthy weight. Lifestyle changes may also include quitting smoking.  If diet and lifestyle changes are not enough to lower your cholesterol, your health care provider may prescribe a statin medicine. This medicine has been shown to lower cholesterol and also lower the risk of heart disease. HOME CARE INSTRUCTIONS  Only take over-the-counter or prescription medicines as directed by your health care provider.   Follow a healthy diet as directed by your health care provider. For instance:   Eat chicken (without skin), fish, veal, shellfish, ground [place] breast, and round or loin cuts of red meat.  Do  not eat fried foods and fatty meats, such as hot dogs and salami.   Eat plenty of fruits, such as apples.   Eat plenty of vegetables, such as broccoli, potatoes, and carrots.   Eat beans, peas, and lentils.   Eat grains, such as barley, rice, couscous, and bulgur wheat.   Eat pasta without  cream sauces.   Use skim or nonfat milk and low-fat or nonfat yogurt and cheeses. Do not eat or drink whole milk, cream, ice cream, egg yolks, and hard cheeses.   Do not eat stick margarine or tub margarines that contain trans fats (also called partially hydrogenated oils).   Do not eat cakes, cookies, crackers, or other baked goods that contain trans fats.   Do not eat saturated tropical oils, such as coconut and palm oil.   Exercise as directed by your health care provider. Increase your activity level with activities such as gardening or walking.   Keep all follow-up appointments.  SEEK MEDICAL CARE IF:  You are struggling to maintain a healthy diet or weight.  You need help starting an exercise program.  You need help to stop smoking. SEEK IMMEDIATE MEDICAL CARE IF:  You have chest pain.  You have trouble breathing.

## 2014-11-13 NOTE — Progress Notes (Signed)
INTERNAL MEDICINE TEACHING ATTENDING ADDENDUM - Klair Leising, MD: I reviewed and discussed at the time of visit with the resident Dr. Emokpae, the patient's medical history, physical examination, diagnosis and results of pertinent tests and treatment and I agree with the patient's care as documented.  

## 2014-11-13 NOTE — Assessment & Plan Note (Signed)
Levels likely still elevated. Last TG- 823, LDL- 42, HDL- 42, 04/18/2014. Pt could not afford any of TG lowering meds- gemfibrozil or fenofibrate. Was started on fish oil and lovastatin. She never picked up Lovastatin prescription.   Plan- Will not check lipid panel today, pt stopped taking fish oil over the past 3 weeks, when she developed Gout flares, pt encouraged to restart. - Emphasized the importance of taking statin, new prescription sent. Lovastatin - 40mg  daily. - Information given and counselling on lifestyle modification- diet and exercise on lowering cholesterol.

## 2014-11-13 NOTE — Progress Notes (Signed)
Patient ID: Jenna May, female   DOB: May 17, 1956, 59 y.o.   MRN: [id number]   Subjective:   Patient ID: Jenna May female   DOB: 1955-12-18 59 y.o.   MRN: [id number]  HPI: Jenna May is a 59 y.o. with PMH listed below, presented toady for routine visit- HTN and health maintenance. Please see problem base charting for assessment and plan.  Past Medical History  Diagnosis Date  . Hypertension   . Tobacco abuse     Quit 2009, after approximately 15 pack year smoking history.  . Anxiety   . 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 Umbilical hernia     S/P repair approximately 1995  . Postmenopausal status   . Bartholin cyst 06/2003  . Normocytic anemia     Unclear etiology. Anemia panel in 03/2010 showing Iron 53 , TIBC 303 , iron saturation percentage 17, ferritin 197 , B12 and folate within normal limits.  . Gout    Current Outpatient Prescriptions  Medication Sig Dispense Refill  . allopurinol (ZYLOPRIM) 100 MG tablet Take 1 tablet (100 mg total) by mouth daily. Start taking with naproxen 250 mg twice a day after gout flare resolves in 2 weeks. 30 tablet 5  . fish oil-omega-3 fatty acids 1000 MG capsule Take 2 capsules (2 g total) by mouth daily. 60 capsule 5  . indomethacin (INDOCIN) 50 MG capsule Take 1 capsule (50 mg total) by mouth 3 (three) times daily with meals. Take until gout flare resolves 30 capsule 0  . loratadine (CLARITIN) 10 MG tablet Take 1 tablet (10 mg total) by mouth daily. 90 tablet 1  . lovastatin (MEVACOR) 40 MG tablet Take 1 tablet (40 mg total) by mouth at bedtime. 30 tablet 6  . metoprolol tartrate (LOPRESSOR) 25 MG tablet Take 1 tablet (25 mg total) by mouth 2 (two) times daily. 60 tablet 6  . naproxen (NAPROSYN) 500 MG tablet Take 0.5 tablets (250 mg total) by mouth 2 (two) times daily with a meal. To start with allopurinol 2 weeks after gout flare resolves    . omeprazole (PRILOSEC) 20 MG capsule Take 1  capsule (20 mg total) by mouth 2 (two) times daily. 60 capsule 1  . triamcinolone ointment (KENALOG) 0.1 % Apply topically 2 (two) times daily. 80 g 0   No current facility-administered medications for this visit.   Family History  Problem Relation Age of Onset  . Hypertension Mother   . Diabetes Father   . Hypertension Father   . Hypertension Sister   . Heart attack Brother 31   History   Social History  . Marital Status: Single    Spouse Name: N/A  . Number of Children: N/A  . Years of Education: 12th grade   Occupational History  . uemployed    Social History Main Topics  . Smoking status: Former Smoker -- 0.40 packs/day for 30 years    Types: Cigarettes    Quit date: 04/11/2008  . Smokeless tobacco: Never Used  . Alcohol Use: No  . Drug Use: No  . Sexual Activity: Not on file   Other Topics Concern  . None   Social History Narrative   Unemployed. Intermittently worked, Museum/gallery curator, Doctor, hospital.    Graduated from high school, [employer].   Single.   No children.   Orange card.   Review of Systems: CONSTITUTIONAL- No Fever, weightloss or change in appetite. SKIN- No Rash, colour changes or  itching. HEAD- No Headache or dizziness. RESPIRATORY- No Cough or SOB. CARDIAC- No chest pain. GI- No nausea, vomiting, but has been having some loose stools, denies constipation, abd pain. URINARY- No Frequency, urgency, straining or dysuria. NEUROLOGIC- No Numbness, syncope, seizures or burning. [facility]- Denies depression or anxiety.  Objective:  Physical Exam: Filed Vitals:   11/13/14 1342  BP: 176/70  Pulse: 62  Temp: 98.8 F (37.1 C)  TempSrc: Oral  Height: 5\' 6"  (1.676 m)  Weight: 194 lb 11.2 oz (88.315 kg)  SpO2: 100%   GENERAL- alert, pleasant lady, co-operative, appears as stated age, not in any distress. HEENT- Atraumatic, normocephalic, neck supple. CARDIAC- RRR, no murmurs, rubs or gallops. RESP- Moving equal volumes of air, and  clear to auscultation bilaterally, no wheezes or crackles. ABDOMEN- Soft, nontender, no palpable masses or organomegaly, bowel sounds present. NEURO- No obvious Cr N abnormality, Gait- Normal. EXTREMITIES- pulse 2+, symmetric, no pedal edema. Right knee- no swelling, no erythema, obviosu effusion, no tenderness on palpation. SKIN- Warm, dry, No rash or lesion. PSYCH- Normal mood and affect, appropriate thought content and speech.  Assessment & Plan:   The patient's case and plan of care was discussed with attending physician, Dr. Dareen Piano.  Please see problem based charting for assessment and plan.

## 2014-11-13 NOTE — Assessment & Plan Note (Signed)
No symptoms or signs of anemia today, per notes unsure why she had anemia. Pt had already left clinic, plan to recheck CBC next visit in 3 months. No source of ongoing blood loss, denies epigastric pain, or dark stools.

## 2014-11-14 LAB — HEPATITIS PANEL, ACUTE
HCV Ab: NEGATIVE
HEP B S AG: NEGATIVE
Hep A IgM: NONREACTIVE
Hep B C IgM: NONREACTIVE

## 2014-11-14 LAB — HIV ANTIBODY (ROUTINE TESTING W REFLEX): HIV 1&2 Ab, 4th Generation: NONREACTIVE

## 2014-11-14 MED ORDER — ALLOPURINOL 100 MG PO TABS: 200.0000 mg | ORAL_TABLET | Freq: Every day | ORAL | Status: DC

## 2014-11-14 NOTE — Addendum Note (Signed)
Addended by: Bethena Roys on: 11/14/2014 01:24 PM   Modules accepted: Orders

## 2014-11-15 ENCOUNTER — Telehealth: Payer: Self-pay | Admitting: *Deleted

## 2014-11-15 NOTE — Telephone Encounter (Signed)
[facility] calls and states they do not carry lovastatin, could you change it to simvastatin, pravastatin or atorvastatin? Thank you

## 2014-11-21 ENCOUNTER — Telehealth: Payer: Self-pay | Admitting: *Deleted

## 2014-11-21 NOTE — Telephone Encounter (Signed)
[facility] called  [phone] - they do not carry Mevacor. Pharmacy has Simvastatin, Pravastatin or Atorvastatin - they will need new Rx. Hilda Blades Gurney Balthazor RN 11/21/14 11AM

## 2014-11-22 MED ORDER — SIMVASTATIN 20 MG PO TABS: 20.0000 mg | ORAL_TABLET | Freq: Every day | ORAL | Status: DC

## 2014-11-22 NOTE — Telephone Encounter (Signed)
Mevacor 40 is equivalent to simva 20. Will Rx it. She really needs med for trig lowering but per notes, can't afford.  She needs future appt May or June with PCP for repeat uric acid level.

## 2014-11-22 NOTE — Telephone Encounter (Signed)
Pt has simva on her med list. Does anything else need done?

## 2014-11-22 NOTE — Telephone Encounter (Signed)
Called to pharm 

## 2014-11-22 NOTE — Telephone Encounter (Signed)
Message sent to front desk pool regarding appt with PCP May or June 2016.

## 2014-11-23 ENCOUNTER — Other Ambulatory Visit: Payer: Self-pay | Admitting: Internal Medicine

## 2014-11-23 DIAGNOSIS — Z1231 Encounter for screening mammogram for malignant neoplasm of breast: Secondary | ICD-10-CM

## 2014-12-18 ENCOUNTER — Ambulatory Visit (HOSPITAL_COMMUNITY)
Admission: RE | Admit: 2014-12-18 | Discharge: 2014-12-18 | Disposition: A | Discharge status: Home / Self Care | Payer: No Typology Code available for payment source | Source: Ambulatory Visit | Attending: Internal Medicine | Admitting: Internal Medicine

## 2014-12-18 ENCOUNTER — Ambulatory Visit (INDEPENDENT_AMBULATORY_CARE_PROVIDER_SITE_OTHER): Payer: No Typology Code available for payment source | Admitting: Internal Medicine

## 2014-12-18 VITALS — BP 141/73 | HR 66 | Temp 98.6°F | Wt 194.8 lb

## 2014-12-18 DIAGNOSIS — M109 Gout, unspecified: Secondary | ICD-10-CM

## 2014-12-18 DIAGNOSIS — E781 Pure hyperglyceridemia: Secondary | ICD-10-CM

## 2014-12-18 DIAGNOSIS — Z1231 Encounter for screening mammogram for malignant neoplasm of breast: Secondary | ICD-10-CM | POA: Insufficient documentation

## 2014-12-18 DIAGNOSIS — D649 Anemia, unspecified: Secondary | ICD-10-CM

## 2014-12-18 DIAGNOSIS — I10 Essential (primary) hypertension: Secondary | ICD-10-CM

## 2014-12-18 DIAGNOSIS — M1000 Idiopathic gout, unspecified site: Secondary | ICD-10-CM

## 2014-12-18 LAB — CBC
HCT: 31.9 % — ABNORMAL LOW (ref 36.0–46.0)
Hemoglobin: 10.3 g/dL — ABNORMAL LOW (ref 12.0–15.0)
MCH: 27.6 pg (ref 26.0–34.0)
MCHC: 32.3 g/dL (ref 30.0–36.0)
MCV: 85.5 fL (ref 78.0–100.0)
MPV: 11.3 fL (ref 8.6–12.4)
Platelets: 187 10*3/uL (ref 150–400)
RBC: 3.73 MIL/uL — AB (ref 3.87–5.11)
RDW: 15.4 % (ref 11.5–15.5)
WBC: 6.6 10*3/uL (ref 4.0–10.5)

## 2014-12-18 LAB — URIC ACID: URIC ACID, SERUM: 8.5 mg/dL — AB (ref 2.4–7.0)

## 2014-12-18 NOTE — Assessment & Plan Note (Addendum)
Now just started on simvastatin 20mg  daily, due to available drugs on formulary and cost issues. She could not afford gemfibrozil or fenofibrate. Also encouraged to continue taking fish oil, also information on lipid lowering therapies given to pt, and encouraged exercise. Pt is concerned and afraid that the medication Simvastatin can cause diabetes and says this has happened to some one she knows, pt reassured.  Per Up to date- Simvastatin like the HMG-COA inh- can cause Diabetes mellitus as an adverse effect- Increases in HbA1c and fasting blood glucose have been reported with HMG-CoA reductase inhibitors; however, the benefits of statin therapy far outweigh the risk of dysglycemia.  - Will not Increase simvastatin for now, given pts apprehension. - HgbA1c- 5.4, 03/2012, blood sugars have mostly been in the 80s- 90s range on Bmets since then.

## 2014-12-18 NOTE — Progress Notes (Signed)
Patient ID: Jenna May, female   DOB: [birth year], 59 y.o.   MRN: [id number]   Subjective:   Patient ID: Jenna May female   DOB: [birth year] 59 y.o.   MRN: [id number]  HPI: [name] is a 59 y.o. with PMH listed below, presented today for follow up of her elevated blood pressure and uric acid check for gout.  Past Medical History  Diagnosis Date  . Hypertension   . Tobacco abuse     Quit 2009, after approximately 15 pack year smoking history.  . Anxiety   . 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 Umbilical hernia     S/P repair approximately 1995  . Postmenopausal status   . Bartholin cyst 06/2003  . Normocytic anemia     Unclear etiology. Anemia panel in 03/2010 showing Iron 53 , TIBC 303 , iron saturation percentage 17, ferritin 197 , B12 and folate within normal limits.  . Gout    Review of Systems: CONSTITUTIONAL- No Fever, or change in appetite. HEAD- No Headache or dizziness. RESPIRATORY- No Cough or SOB. CARDIAC- No Palpitations, DOE, PND or chest pain. GI- No nausea, vomiting, diarrhoea, constipation, abd pain. URINARY- No Frequency, urgency, straining or dysuria.  Objective:  Physical Exam: Filed Vitals:   12/18/14 1331  BP: 141/73  Pulse: 66  Temp: 98.6 F (37 C)  Weight: 194 lb 12.8 oz (88.361 kg)  SpO2: 100%   General appearance: alert, cooperative, appears stated age and no distress Head: Normocephalic, without obvious abnormality, atraumatic Lungs: clear to auscultation bilaterally Heart: regular rate and rhythm, S1, S2 normal, no murmur, click, rub or gallop Abdomen: soft, non-tender; bowel sounds normal; no masses,  no organomegaly Extremities: extremities normal, atraumatic, no cyanosis or edema Pulses: 2+ and symmetric Assessment & Plan:   The patient's case and plan of care was discussed with attending physician, Dr. Lynnae January.  Please see problem based charting for assessment and plan.

## 2014-12-18 NOTE — Assessment & Plan Note (Addendum)
Pt say she has always had anemia, since she was younger. She has had a hysterectomy, also with normal Colonoscopy. Anemia panel in 03/2010 showing Iron 53 , TIBC 303 , iron saturation percentage 17, ferritin 197 , B12 and folate within normal limits. Not taking NSAIDs, so if worsening, might require FOBTs and endoscopy. Hgb appears to be 9-11 at baseline.  TSH now normal, so doubt hypothyroidsm as the cause.  Doubt thalassemia as MCV appears to be at baseline.  10/2013- total bilirubin 0.5- so doubt hemolytic disease.  WBC and platelet normal also Retic count- 2012 and 2013- normal at 1.5 and 1.3, so doubt Bone marrow depression or aplastic anemia.  No chronic disease that would explain anemia, kidney function is normal. Last Hgb- 11.6- 03/2012.  - Will recheck CBC today.   Addendum- Hgb today- 10.3 stable at baseline. Etiology still unknown.

## 2014-12-18 NOTE — Assessment & Plan Note (Addendum)
No joint pain or swelling, over the past 2 months. Has been taking allopurinol 200mg  every other day, instead of everyday as prescribed, she said it made her stools loose- not watery, she was having 2 bowel movements a day. Pt encouraged to take this med everyday, she voiced understanding. Last uric acid- 9.2. Other options like febuxostat would not be affordable.  - Uric acid check today.  Addendum- Pt uric acid- 8.5. She has not been complaint with her uric acid lowering therapy. Will not adjust dose, she is supposed to be on prophylaxis with colchicine while on uric acid lowering therapy- but could not afford. She should probably be on an NSAID, but considering the side effects of GI bleed( She has chronically low Hgb) and kidney injury, hesitant to start her on daily NSAIDs for two month, discussed this with patient and she has opted not to take NSAIDs for now. Emphasized need for been consistency with meds. - She will come in for just lab draw in 6 weeks- 2 months, she sometimes has transportation problems

## 2014-12-18 NOTE — Patient Instructions (Signed)
It was nice seeing you today.  Please please take your Gout medication, this will reduce the chances of you having swollen and painful joints.  Also take your cholesterol medication, and take fish oil too. Listed below are some food choices. Also keep up the good work with your cholesterol.                                                                                   Food Choices to Lower Your Triglycerides  Triglycerides are a type of fat in your blood. High levels of triglycerides can increase the risk of heart disease and stroke. If your triglyceride levels are high, the foods you eat and your eating habits are very important. Choosing the right foods can help lower your triglycerides.  WHAT GENERAL GUIDELINES DO I NEED TO FOLLOW?  Lose weight if you are overweight.   Limit or avoid alcohol.   Fill one half of your plate with vegetables and green salads.   Limit fruit to two servings a day. Choose fruit instead of juice.   Make one fourth of your plate whole grains. Look for the word "whole" as the first word in the ingredient list.  Fill one fourth of your plate with lean protein foods.  Enjoy fatty fish (such as salmon, mackerel, sardines, and tuna) three times a week.   Choose healthy fats.   Limit foods high in starch and sugar.  Eat more home-cooked food and less restaurant, buffet, and fast food.  Limit fried foods.  Cook foods using methods other than frying.  Limit saturated fats.  Check ingredient lists to avoid foods with partially hydrogenated oils (trans fats) in them. WHAT FOODS CAN I EAT?  Grains Whole grains, such as whole wheat or whole grain breads, crackers, cereals, and pasta. Unsweetened oatmeal, bulgur, barley, quinoa, or brown rice. Corn or whole wheat flour tortillas.  Vegetables Fresh or frozen vegetables (raw, steamed, roasted, or grilled). Green salads. Fruits All fresh, canned (in natural juice), or frozen fruits. Meat and Other  Protein Products Ground beef (85% or leaner), grass-fed beef, or beef trimmed of fat. Skinless chicken or [place]. Ground chicken or [place]. Pork trimmed of fat. All fish and seafood. Eggs. Dried beans, peas, or lentils. Unsalted nuts or seeds. Unsalted canned or dry beans. Dairy Low-fat dairy products, such as skim or 1% milk, 2% or reduced-fat cheeses, low-fat ricotta or cottage cheese, or plain low-fat yogurt. Fats and Oils Tub margarines without trans fats. Light or reduced-fat mayonnaise and salad dressings. Avocado. Safflower, olive, or canola oils. Natural peanut or almond butter. The items listed above may not be a complete list of recommended foods or beverages. Contact your dietitian for more options. WHAT FOODS ARE NOT RECOMMENDED?  Grains White bread. White pasta. White rice. Cornbread. Bagels, pastries, and croissants. Crackers that contain trans fat. Vegetables White potatoes. Corn. Creamed or fried vegetables. Vegetables in a cheese sauce. Fruits Dried fruits. Canned fruit in light or heavy syrup. Fruit juice. Meat and Other Protein Products Fatty cuts of meat. Ribs, chicken wings, bacon, sausage, bologna, salami, chitterlings, fatback, hot dogs, bratwurst, and packaged luncheon meats. Dairy Whole or 2% milk, cream, half-and-half,  and cream cheese. Whole-fat or sweetened yogurt. Full-fat cheeses. Nondairy creamers and whipped toppings. Processed cheese, cheese spreads, or cheese curds. Sweets and Desserts Corn syrup, sugars, honey, and molasses. Candy. Jam and jelly. Syrup. Sweetened cereals. Cookies, pies, cakes, donuts, muffins, and ice cream. Fats and Oils Butter, stick margarine, lard, shortening, ghee, or bacon fat. Coconut, palm kernel, or palm oils. Beverages Alcohol. Sweetened drinks (such as sodas, lemonade, and fruit drinks or punches). The items listed above may not be a complete list of foods and beverages to avoid. Contact your dietitian for more information. care  provider.

## 2014-12-18 NOTE — Assessment & Plan Note (Addendum)
BP Readings from Last 3 Encounters:  12/18/14 141/73  11/13/14 161/77  10/17/14 138/68    Lab Results  Component Value Date   NA 140 11/13/2014   K 4.1 11/13/2014   CREATININE 0.63 11/13/2014    Assessment: Blood pressure control:  Controlled Progress toward BP goal:   At goal Comments: Pt brought Bp log, over the past month, all readings- 257D- 051G systolic, and Diastolic in the 33P- 82P. She has been complaint with her metoprolol 25mg  BID, pulse- 66.   Plan: Medications:  continue current medications Educational resources provided:   Self management tools provided:   Other plans:

## 2014-12-21 NOTE — Progress Notes (Signed)
Internal Medicine Clinic Attending  Case discussed with Dr. Emokpae soon after the resident saw the patient.  We reviewed the resident's history and exam and pertinent patient test results.  I agree with the assessment, diagnosis, and plan of care documented in the resident's note. 

## 2015-03-29 NOTE — Addendum Note (Signed)
Addended by: Orson Gear on: 03/29/2015 03:40 PM   Modules accepted: Orders

## 2015-04-30 ENCOUNTER — Ambulatory Visit (INDEPENDENT_AMBULATORY_CARE_PROVIDER_SITE_OTHER): Payer: Self-pay | Admitting: Internal Medicine

## 2015-04-30 ENCOUNTER — Encounter: Payer: Self-pay | Admitting: Internal Medicine

## 2015-04-30 VITALS — BP 150/84 | HR 58 | Temp 98.6°F | Ht 66.0 in | Wt 196.2 lb

## 2015-04-30 DIAGNOSIS — E781 Pure hyperglyceridemia: Secondary | ICD-10-CM

## 2015-04-30 DIAGNOSIS — Z0000 Encounter for general adult medical examination without abnormal findings: Secondary | ICD-10-CM

## 2015-04-30 DIAGNOSIS — M1000 Idiopathic gout, unspecified site: Secondary | ICD-10-CM

## 2015-04-30 DIAGNOSIS — M109 Gout, unspecified: Secondary | ICD-10-CM

## 2015-04-30 DIAGNOSIS — M10061 Idiopathic gout, right knee: Secondary | ICD-10-CM

## 2015-04-30 DIAGNOSIS — Z23 Encounter for immunization: Secondary | ICD-10-CM

## 2015-04-30 DIAGNOSIS — I10 Essential (primary) hypertension: Secondary | ICD-10-CM

## 2015-04-30 MED ORDER — LOSARTAN POTASSIUM 25 MG PO TABS: 25.0000 mg | ORAL_TABLET | Freq: Every day | ORAL | Status: DC

## 2015-04-30 MED ORDER — SIMVASTATIN 40 MG PO TABS: 40.0000 mg | ORAL_TABLET | Freq: Every day | ORAL | Status: DC

## 2015-04-30 NOTE — Progress Notes (Signed)
Patient ID: Jenna May, female   DOB: 02/13/1956, 59 y.o.   MRN: [id number]   Subjective:   Patient ID: Jenna May female   DOB: January 21, 1956 59 y.o.   MRN: [id number]  HPI: [name] is a 59 y.o. with PMH listed below. Presented today for follow up on her HTN, Gout, Hypertriglyceridemia.  Please see assessment and plan for status on patients chronic medical conditions.  Past Medical History  Diagnosis Date  . Hypertension   . Tobacco abuse     Quit 2009, after approximately 15 pack year smoking history.  . Anxiety   . 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 Umbilical hernia     S/P repair approximately 1995  . Postmenopausal status   . Bartholin cyst 06/2003  . Normocytic anemia     Unclear etiology. Anemia panel in 03/2010 showing Iron 53 , TIBC 303 , iron saturation percentage 17, ferritin 197 , B12 and folate within normal limits.  . Gout    Current Outpatient Prescriptions  Medication Sig Dispense Refill  . allopurinol (ZYLOPRIM) 100 MG tablet Take 2 tablets (200 mg total) by mouth daily. Start taking with naproxen 250 mg twice a day after gout flare resolves in 2 weeks. 30 tablet 5  . fish oil-omega-3 fatty acids 1000 MG capsule Take 2 capsules (2 g total) by mouth daily. 60 capsule 5  . loratadine (CLARITIN) 10 MG tablet Take 1 tablet (10 mg total) by mouth daily. 90 tablet 1  . metoprolol tartrate (LOPRESSOR) 25 MG tablet Take 1 tablet (25 mg total) by mouth 2 (two) times daily. 60 tablet 6  . simvastatin (ZOCOR) 20 MG tablet Take 1 tablet (20 mg total) by mouth daily. 30 tablet 2  . triamcinolone ointment (KENALOG) 0.1 % Apply topically 2 (two) times daily. 80 g 0   No current facility-administered medications for this visit.   Family History  Problem Relation Age of Onset  . Hypertension Mother   . Diabetes Father   . Hypertension Father   . Hypertension Sister   . Heart attack Brother 28   Social History    Social History  . Marital Status: Single    Spouse Name: N/A  . Number of Children: N/A  . Years of Education: 12th grade   Occupational History  . uemployed    Social History Main Topics  . Smoking status: Former Smoker -- 0.40 packs/day for 30 years    Types: Cigarettes    Quit date: 04/11/2008  . Smokeless tobacco: Never Used  . Alcohol Use: No  . Drug Use: No  . Sexual Activity: Not Asked   Other Topics Concern  . None   Social History Narrative   Unemployed. Intermittently worked, Museum/gallery curator, Doctor, hospital.    Graduated from high school, [employer].   Single.   No children.   Orange card.   Review of Systems: CONSTITUTIONAL- No Fever, weightloss, or change in appetite. SKIN- No Rash, colour changes or itching. HEAD- No Headache or dizziness. EYES- No Vision loss, pain,  or blurred vision. Mouth/throat- No Sorethroat, or bleeding gums. RESPIRATORY- No Cough or SOB. CARDIAC- No Palpitations,  or chest pain. GI- No nausea, vomiting, diarrhoea, abd pain. URINARY- No Frequency, or dysuria. NEUROLOGIC- No Numbness, or burning. [employer]- Denies depression or anxiety.  Objective:  Physical Exam: Filed Vitals:   04/30/15 1333  BP: 165/71  Pulse: 63  Temp: 98.6 F (37 C)  TempSrc: Oral  Height: 5\' 6"  (1.676 m)  Weight: 196 lb 3.2 oz (88.996 kg)  SpO2: 100%   GENERAL- alert, co-operative, appears as stated age, not in any distress. HEENT- Atraumatic, normocephalic, PERRL, oral mucosa appears moist, neck supple. CARDIAC- RRR, no murmurs, rubs or gallops. RESP- Moving equal volumes of air, and clear to auscultation bilaterally, no wheezes or crackles. ABDOMEN- Soft, nontender, no guarding or rebound, bowel sounds present. NEURO- No obvious Cr N abnormality, strenght upper and lower extremities- 5/5, Gait- Normal. EXTREMITIES- Warm and well perfused, no pedal edema. SKIN- Warm, dry, No rash or lesion. PSYCH- Normal mood and affect, appropriate  thought content and speech.  Assessment & Plan:  The patient's case and plan of care was discussed with attending physician, Dr. Dareen Piano.  Please see problem based charting for assessment and plan.

## 2015-04-30 NOTE — Assessment & Plan Note (Addendum)
Check Uric acid today. She has been complaint with allorpurinol 200mg  daily, will cont . Started on losartan which has some uricosuric effect. No more gout flares.  Addendum- Uric acid level now Increase allopurinol to 300mg  once a day. Pt says she has been complaint with previous medication dose.

## 2015-04-30 NOTE — Assessment & Plan Note (Signed)
Flu shot today 

## 2015-04-30 NOTE — Patient Instructions (Signed)
Please continue taking your Simvastatin, we want to increase the dose of the medication you are on. Take 2 tablets everyday from now on. This is important.   Also remember to watch your diet and do exercise as this will affect your cholesterol levels also.   We will also be changing your blood pressure medications. We will be starting you in Losartan, Take one tablet everyday. We will like you to check your blood pressure at home at least three times a week, and bring it on your next visit. We want to see you in 4 weeks.   Please also take your cholesterol medication.

## 2015-04-30 NOTE — Assessment & Plan Note (Addendum)
BP Readings from Last 3 Encounters:  04/30/15 150/84  12/18/14 141/73  11/13/14 161/77    Lab Results  Component Value Date   NA 140 11/13/2014   K 4.1 11/13/2014   CREATININE 0.63 11/13/2014    Assessment: Blood pressure control:   Uncontrolled      Comments: Compliant with meds- metop 25mg  BID Plan: Medications:  Will change Bp meds to lorsatan start at 25mg  daily. No HCTZ considering gout. Other plans: Up titrate Losartan next visit - She will bring BP log next visit - Bmet next visit- check K and Cr - D/c metoprolol. - See in 4 weeks.

## 2015-04-30 NOTE — Assessment & Plan Note (Signed)
Patient has been taking her fish oil everyday, but she has been taking the Simvastatin intermittently. She is on low dose simvastatin.  - Cont fish oil - Increase simvastatin to 40mg  daily - Cont diet and lifestyle modification.

## 2015-05-01 LAB — URIC ACID: Uric Acid: 9.5 mg/dL — ABNORMAL HIGH (ref 2.5–7.1)

## 2015-05-01 NOTE — Progress Notes (Signed)
Internal Medicine Clinic Attending  Case discussed with Dr. Denton Brick at the time of the visit.  We reviewed the resident's history and exam and pertinent patient test results.  I agree with the assessment, diagnosis, and plan of care documented in the resident's note.  Moderate to severe hypertriglyceridemia with elevation in other lipids that would give her 10 year ASCVD risk of around 10%. It is unclear how much triglycerides contribute to atherosclerosis, and if treating with fibrates will lower risk of ASCVD. I agree with focusing on statin therapy to most effectively manage primary prevention of CVD. Triglycerides are not likely high enough to risk pancreatitis, and they will decrease with statin effect.

## 2015-05-03 MED ORDER — ALLOPURINOL 300 MG PO TABS: 300.0000 mg | ORAL_TABLET | Freq: Every day | ORAL | Status: DC

## 2015-05-03 NOTE — Addendum Note (Signed)
Addended by: Bethena Roys on: 05/03/2015 09:42 AM   Modules accepted: Orders

## 2015-05-22 ENCOUNTER — Ambulatory Visit: Payer: No Typology Code available for payment source

## 2015-07-16 ENCOUNTER — Other Ambulatory Visit: Payer: Self-pay | Admitting: Internal Medicine

## 2015-07-16 DIAGNOSIS — I10 Essential (primary) hypertension: Secondary | ICD-10-CM

## 2015-07-16 MED ORDER — LOSARTAN POTASSIUM 25 MG PO TABS: 25.0000 mg | ORAL_TABLET | Freq: Every day | ORAL | Status: DC

## 2015-07-16 NOTE — Telephone Encounter (Signed)
Called to pharm 

## 2015-07-16 NOTE — Telephone Encounter (Signed)
Pt requesting losartan to be filled @ [facility].

## 2015-08-13 ENCOUNTER — Ambulatory Visit (INDEPENDENT_AMBULATORY_CARE_PROVIDER_SITE_OTHER): Payer: No Typology Code available for payment source | Admitting: Internal Medicine

## 2015-08-13 ENCOUNTER — Encounter: Payer: Self-pay | Admitting: Internal Medicine

## 2015-08-13 VITALS — BP 137/77 | HR 78 | Temp 98.9°F | Ht 66.0 in | Wt 199.1 lb

## 2015-08-13 DIAGNOSIS — I10 Essential (primary) hypertension: Secondary | ICD-10-CM

## 2015-08-13 DIAGNOSIS — M1A0610 Idiopathic chronic gout, right knee, without tophus (tophi): Secondary | ICD-10-CM

## 2015-08-13 DIAGNOSIS — M109 Gout, unspecified: Secondary | ICD-10-CM

## 2015-08-13 DIAGNOSIS — R350 Frequency of micturition: Secondary | ICD-10-CM | POA: Insufficient documentation

## 2015-08-13 MED ORDER — LOSARTAN POTASSIUM 25 MG PO TABS: 25.0000 mg | ORAL_TABLET | Freq: Every day | ORAL | Status: DC

## 2015-08-13 NOTE — Patient Instructions (Signed)
1. We checked your blood and urine today.  Losartan tablets What is this medicine? LOSARTAN (loe SAR tan) is used to treat high blood pressure and to reduce the risk of stroke in certain patients. This drug also slows the progression of kidney disease in patients with diabetes. This medicine may be used for other purposes; ask your health care provider or pharmacist if you have questions. What should I tell my health care provider before I take this medicine? They need to know if you have any of these conditions: -heart failure -kidney or liver disease -an unusual or allergic reaction to losartan, other medicines, foods, dyes, or preservatives -pregnant or trying to get pregnant -breast-feeding How should I use this medicine? Take this medicine by mouth with a glass of water. Follow the directions on the prescription label. This medicine can be taken with or without food. Take your doses at regular intervals. Do not take your medicine more often than directed. Talk to your pediatrician regarding the use of this medicine in children. Special care may be needed. Overdosage: If you think you have taken too much of this medicine contact a poison control center or emergency room at once. NOTE: This medicine is only for you. Do not share this medicine with others. What if I miss a dose? If you miss a dose, take it as soon as you can. If it is almost time for your next dose, take only that dose. Do not take double or extra doses. What may interact with this medicine? -blood pressure medicines -diuretics, especially triamterene, spironolactone, or amiloride -fluconazole -NSAIDs, medicines for pain and inflammation, like ibuprofen or naproxen -potassium salts or potassium supplements -rifampin This list may not describe all possible interactions. Give your health care provider a list of all the medicines, herbs, non-prescription drugs, or dietary supplements you use. Also tell them if you smoke,  drink alcohol, or use illegal drugs. Some items may interact with your medicine. What should I watch for while using this medicine? Visit your doctor or health care professional for regular checks on your progress. Check your blood pressure as directed. Ask your doctor or health care professional what your blood pressure should be and when you should contact him or her. Call your doctor or health care professional if you notice an irregular or fast heart beat. Women should inform their doctor if they wish to become pregnant or think they might be pregnant. There is a potential for serious side effects to an unborn child, particularly in the second or third trimester. Talk to your health care professional or pharmacist for more information. You may get drowsy or dizzy. Do not drive, use machinery, or do anything that needs mental alertness until you know how this drug affects you. Do not stand or sit up quickly, especially if you are an older patient. This reduces the risk of dizzy or fainting spells. Alcohol can make you more drowsy and dizzy. Avoid alcoholic drinks. Avoid salt substitutes unless you are told otherwise by your doctor or health care professional. Do not treat yourself for coughs, colds, or pain while you are taking this medicine without asking your doctor or health care professional for advice. Some ingredients may increase your blood pressure. What side effects may I notice from receiving this medicine? Side effects that you should report to your doctor or health care professional as soon as possible: -confusion, dizziness, light headedness or fainting spells -decreased amount of urine passed -difficulty breathing or swallowing, hoarseness, or tightening  of the throat -fast or irregular heart beat, palpitations, or chest pain -skin rash, itching -swelling of your face, lips, tongue, hands, or feet Side effects that usually do not require medical attention (report to your doctor or  health care professional if they continue or are bothersome): -cough -decreased sexual function or desire -headache -nasal congestion or stuffiness -nausea or stomach pain -sore or cramping muscles This list may not describe all possible side effects. Call your doctor for medical advice about side effects. You may report side effects to FDA at 1-800-FDA-1088. Where should I keep my medicine? Keep out of the reach of children. Store at room temperature between 15 and 30 degrees C (59 and 86 degrees F). Protect from light. Keep container tightly closed. Throw away any unused medicine after the expiration date. NOTE: This sheet is a summary. It may not cover all possible information. If you have questions about this medicine, talk to your doctor, pharmacist, or health care provider.    2016, Elsevier/Gold Standard. (2007-09-23 16:42:18)

## 2015-08-13 NOTE — Assessment & Plan Note (Signed)
urinary frequency, waking about 3 times per night to pee.  She states it was worse when taking Losartan at night, but has improved since taking it this morning.  She denies urinary incontinence, weight gain, or leg swelling.  She denies fever, chills, SOB, dysuria, or color, odor, or blood in the urine.  She has no h/o DM.    She has a h/o gout, currently controlled on Allopurinol.  She denies back pain, but has had a "nagging" right sided abdominal pain since she was pulling on a rug a week ago.  A/P: No s/sx or UTI or obvious signs of stone.  As she has elevated urica acid and is on Losartan, it is possible she has a new uric acid stone, but this would be unlikely.  Will screen with U/A and uric acid. - U/A - Uric acid

## 2015-08-13 NOTE — Progress Notes (Signed)
Case discussed with Dr. Taylor at the time of the visit. We reviewed the resident's history and exam and pertinent patient test results. I agree with the assessment, diagnosis, and plan of care documented in the resident's note. 

## 2015-08-13 NOTE — Progress Notes (Signed)
Patient ID: Jenna May, female   DOB: Dec 13, 1955, 60 y.o.   MRN: [id number]    Subjective:   Patient ID: Jenna May female   DOB: 05-25-1956 60 y.o.   MRN: [id number]  HPI: [name] is a 60 y.o. female with PMH as below, here for BP recheck after starting Losartan.  Please see Problem-Based charting for the status of the patient's chronic medical issues.  Patient reports her BP is well controlled.  The only side effect she has noticed is urinary frequency, waking about 3 times per night to pee.  She states it was worse when taking Losartan at night, but has improved since taking it this morning.  She denies urinary incontinence, weight gain, or leg swelling.  She denies fever, chills, SOB, dysuria, or color, odor, or blood in the urine.  She has no h/o DM.    She has a h/o gout, currently controlled on Allopurinol.  She denies back pain, but has had a "nagging" right sided abdominal pain since she was pulling on a rug a week ago.  Otherwise, she has intermittent dizziness with sudden standing.  She denies falls, chest pain, or palpitations.  She denies cough.   Past Medical History  Diagnosis Date  . Hypertension   . Tobacco abuse     Quit 2009, after approximately 15 pack year smoking history.  . Anxiety   . 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 Umbilical hernia     S/P repair approximately 1995  . Postmenopausal status   . Bartholin cyst 06/2003  . Normocytic anemia     Unclear etiology. Anemia panel in 03/2010 showing Iron 53 , TIBC 303 , iron saturation percentage 17, ferritin 197 , B12 and folate within normal limits.  . Gout    Current Outpatient Prescriptions  Medication Sig Dispense Refill  . allopurinol (ZYLOPRIM) 300 MG tablet Take 1 tablet (300 mg total) by mouth daily. 30 tablet 3  . fish oil-omega-3 fatty acids 1000 MG capsule Take 2 capsules (2 g total) by mouth daily. 60 capsule 5  . loratadine (CLARITIN) 10 MG  tablet Take 1 tablet (10 mg total) by mouth daily. 90 tablet 1  . losartan (COZAAR) 25 MG tablet Take 1 tablet (25 mg total) by mouth daily. 30 tablet 0  . simvastatin (ZOCOR) 40 MG tablet Take 1 tablet (40 mg total) by mouth daily. 30 tablet 3  . triamcinolone ointment (KENALOG) 0.1 % Apply topically 2 (two) times daily. 80 g 0   No current facility-administered medications for this visit.   Family History  Problem Relation Age of Onset  . Hypertension Mother   . Diabetes Father   . Hypertension Father   . Hypertension Sister   . Heart attack Brother 52   Social History   Social History  . Marital Status: Single    Spouse Name: N/A  . Number of Children: N/A  . Years of Education: 12th grade   Occupational History  . uemployed    Social History Main Topics  . Smoking status: Former Smoker -- 0.40 packs/day for 30 years    Types: Cigarettes    Quit date: 04/11/2008  . Smokeless tobacco: Never Used  . Alcohol Use: No  . Drug Use: No  . Sexual Activity: Not Asked   Other Topics Concern  . None   Social History Narrative   Unemployed. Intermittently worked, Museum/gallery curator, Doctor, hospital.    Graduated from high  school, [employer].   Single.   No children.   [place] card.   Review of Systems: Pertinent items are noted in HPI. Objective:  Physical Exam: Filed Vitals:   08/13/15 0846  BP: 137/77  Pulse: 78  Temp: 98.9 F (37.2 C)  TempSrc: Oral  Height: 5\' 6"  (1.676 m)  Weight: 199 lb 1.6 oz (90.311 kg)  SpO2: 100%   Physical Exam  Constitutional: She is oriented to person, place, and time and well-developed, well-nourished, and in no distress. No distress.  HENT:  Head: Normocephalic and atraumatic.  Eyes: EOM are normal.  Neck: No JVD present.  Cardiovascular: Normal rate, regular rhythm and normal heart sounds.   Pulmonary/Chest: Effort normal and breath sounds normal. No stridor. No respiratory distress. She has no wheezes.    Musculoskeletal: She exhibits no edema.  Neurological: She is alert and oriented to person, place, and time.  Skin: Skin is warm and dry. She is not diaphoretic.     Assessment & Plan:   Patient and case were discussed with Dr. Eppie Gibson.  Please refer to Problem Based charting for further documentation.

## 2015-08-13 NOTE — Assessment & Plan Note (Signed)
Patient had Allopurinol increased to 300 mg last visit.  No complaints today.  A/P: Gout, controlled on Allopurinol. - Check Uric acid.

## 2015-08-13 NOTE — Assessment & Plan Note (Signed)
Patient reports her BP is well controlled.  The only side effect she has noticed is urinary frequency, waking about 3 times per night to pee.  She states it was worse when taking Losartan at night, but has improved since taking it this morning.  She denies urinary incontinence, weight gain, or leg swelling.  She denies fever, chills, SOB, dysuria, or color, odor, or blood in the urine.  She has no h/o DM.    She has a h/o gout, currently controlled on Allopurinol.  She denies back pain, but has had a "nagging" right sided abdominal pain since she was pulling on a rug a week ago.  Otherwise, she has intermittent dizziness with sudden standing.  She denies falls, chest pain, or palpitations.  She denies cough.  A/P: BP well controlled on Losartan 25 mg.  No classic symptoms of Losartan, but patient complaining of urinary frequency.  - Refill Losartan - BMP to check Cr, K

## 2015-08-14 LAB — BMP8+ANION GAP
Anion Gap: 22 mmol/L — ABNORMAL HIGH (ref 10.0–18.0)
BUN/Creatinine Ratio: 30 — ABNORMAL HIGH (ref 9–23)
BUN: 19 mg/dL (ref 6–24)
CO2: 20 mmol/L (ref 18–29)
CREATININE: 0.64 mg/dL (ref 0.57–1.00)
Calcium: 10.1 mg/dL (ref 8.7–10.2)
Chloride: 101 mmol/L (ref 96–106)
GFR calc Af Amer: 115 mL/min/{1.73_m2} (ref >59)
GFR calc non Af Amer: 99 mL/min/{1.73_m2} (ref >59)
GLUCOSE: 96 mg/dL (ref 65–99)
Potassium: 4.1 mmol/L (ref 3.5–5.2)
Sodium: 143 mmol/L (ref 134–144)

## 2015-08-14 LAB — URINALYSIS, ROUTINE W REFLEX MICROSCOPIC
Bilirubin, UA: NEGATIVE
Glucose, UA: NEGATIVE
KETONES UA: NEGATIVE
Leukocytes, UA: NEGATIVE
NITRITE UA: NEGATIVE
RBC UA: NEGATIVE
SPEC GRAV UA: 1.024 (ref 1.005–1.030)
UUROB: 0.2 mg/dL (ref 0.2–1.0)
pH, UA: 5.5 (ref 5.0–7.5)

## 2015-08-14 LAB — URIC ACID: URIC ACID: 6.1 mg/dL (ref 2.5–7.1)

## 2015-09-23 ENCOUNTER — Telehealth: Payer: Self-pay | Admitting: Internal Medicine

## 2015-09-23 NOTE — Telephone Encounter (Signed)
Unfortunately I am on nights now, so its a challenge to call patient. Please you can let her know she can come to clinic to see [place] at her earliest convenience. I will be in clinic for the next 2 weeks. Thanks [employer].  Ejiro.

## 2015-09-23 NOTE — Telephone Encounter (Signed)
Pt states pain on the left knee and unable to walk, requesting med. Please call pt back.

## 2015-09-23 NOTE — Telephone Encounter (Signed)
Pt states for past 10 days - after standing on a chair on her "tiptoes" her leg has been painful, swollen, foot is swollen, knee is swollen, warmer to touch than the other, cannot pull toes up toward knee, extreme pain, cannot walk. She is advised to go to ED and states she cannot go and leave her house because people may break in. States she has taken her gout medicine, ibuprofen, aleve, blue emu cream rubbed on the leg and taking all her meds as prescribed. She is advised to come to ED asap but she states she cannot leave her house for fear of a breakin Dr Denton Brick she would like you to call her

## 2015-09-23 NOTE — Telephone Encounter (Signed)
Called, lm for rtc 

## 2015-09-24 NOTE — Telephone Encounter (Signed)
Called pt this am, no changes, made appt for mon 3/6 at 0815, encouraged to continue to think about ED for eval of swelling of leg

## 2015-09-25 ENCOUNTER — Emergency Department (HOSPITAL_COMMUNITY)
Admission: EM | Admit: 2015-09-25 | Discharge: 2015-09-25 | Disposition: A | Discharge status: Home / Self Care | Payer: No Typology Code available for payment source | Attending: Emergency Medicine | Admitting: Emergency Medicine

## 2015-09-25 ENCOUNTER — Encounter (HOSPITAL_COMMUNITY): Payer: Self-pay | Admitting: Emergency Medicine

## 2015-09-25 ENCOUNTER — Emergency Department (EMERGENCY_DEPARTMENT_HOSPITAL): Payer: No Typology Code available for payment source

## 2015-09-25 DIAGNOSIS — Z87891 Personal history of nicotine dependence: Secondary | ICD-10-CM | POA: Insufficient documentation

## 2015-09-25 DIAGNOSIS — Z862 Personal history of diseases of the blood and blood-forming organs and certain disorders involving the immune mechanism: Secondary | ICD-10-CM | POA: Insufficient documentation

## 2015-09-25 DIAGNOSIS — M25462 Effusion, left knee: Secondary | ICD-10-CM

## 2015-09-25 DIAGNOSIS — M79609 Pain in unspecified limb: Secondary | ICD-10-CM

## 2015-09-25 DIAGNOSIS — Z87448 Personal history of other diseases of urinary system: Secondary | ICD-10-CM | POA: Insufficient documentation

## 2015-09-25 DIAGNOSIS — Z7952 Long term (current) use of systemic steroids: Secondary | ICD-10-CM | POA: Insufficient documentation

## 2015-09-25 DIAGNOSIS — M7989 Other specified soft tissue disorders: Secondary | ICD-10-CM

## 2015-09-25 DIAGNOSIS — Z8719 Personal history of other diseases of the digestive system: Secondary | ICD-10-CM | POA: Insufficient documentation

## 2015-09-25 DIAGNOSIS — Z8579 Personal history of other malignant neoplasms of lymphoid, hematopoietic and related tissues: Secondary | ICD-10-CM | POA: Insufficient documentation

## 2015-09-25 DIAGNOSIS — M109 Gout, unspecified: Secondary | ICD-10-CM | POA: Insufficient documentation

## 2015-09-25 DIAGNOSIS — I10 Essential (primary) hypertension: Secondary | ICD-10-CM | POA: Insufficient documentation

## 2015-09-25 DIAGNOSIS — Z79899 Other long term (current) drug therapy: Secondary | ICD-10-CM | POA: Insufficient documentation

## 2015-09-25 DIAGNOSIS — Z8659 Personal history of other mental and behavioral disorders: Secondary | ICD-10-CM | POA: Insufficient documentation

## 2015-09-25 LAB — COMPREHENSIVE METABOLIC PANEL
ALBUMIN: 4 g/dL (ref 3.5–5.0)
ALT: 21 U/L (ref 14–54)
AST: 15 U/L (ref 15–41)
Alkaline Phosphatase: 82 U/L (ref 38–126)
Anion gap: 12 (ref 5–15)
BUN: 14 mg/dL (ref 6–20)
CHLORIDE: 106 mmol/L (ref 101–111)
CO2: 19 mmol/L — ABNORMAL LOW (ref 22–32)
CREATININE: 0.77 mg/dL (ref 0.44–1.00)
Calcium: 10.2 mg/dL (ref 8.9–10.3)
GFR calc Af Amer: >60 mL/min (ref >60)
GLUCOSE: 104 mg/dL — AB (ref 65–99)
Potassium: 3.7 mmol/L (ref 3.5–5.1)
Sodium: 137 mmol/L (ref 135–145)
Total Bilirubin: 0.1 mg/dL — ABNORMAL LOW (ref 0.3–1.2)
Total Protein: 8.4 g/dL — ABNORMAL HIGH (ref 6.5–8.1)

## 2015-09-25 LAB — CBC WITH DIFFERENTIAL/PLATELET
BASOS ABS: 0 10*3/uL (ref 0.0–0.1)
Basophils Relative: 0 %
EOS PCT: 1 %
Eosinophils Absolute: 0.1 10*3/uL (ref 0.0–0.7)
HEMATOCRIT: 31 % — AB (ref 36.0–46.0)
Hemoglobin: 10.1 g/dL — ABNORMAL LOW (ref 12.0–15.0)
LYMPHS PCT: 31 %
Lymphs Abs: 3.4 10*3/uL (ref 0.7–4.0)
MCH: 28.6 pg (ref 26.0–34.0)
MCHC: 32.6 g/dL (ref 30.0–36.0)
MCV: 87.8 fL (ref 78.0–100.0)
Monocytes Absolute: 0.7 10*3/uL (ref 0.1–1.0)
Monocytes Relative: 6 %
NEUTROS ABS: 6.9 10*3/uL (ref 1.7–7.7)
Neutrophils Relative %: 62 %
PLATELETS: 298 10*3/uL (ref 150–400)
RBC: 3.53 MIL/uL — AB (ref 3.87–5.11)
RDW: 14.3 % (ref 11.5–15.5)
WBC: 11.1 10*3/uL — AB (ref 4.0–10.5)

## 2015-09-25 LAB — GRAM STAIN: Special Requests: NORMAL

## 2015-09-25 LAB — SYNOVIAL CELL COUNT + DIFF, W/ CRYSTALS
CRYSTALS FLUID: NONE SEEN
Eosinophils-Synovial: 2 % — ABNORMAL HIGH (ref 0–1)
LYMPHOCYTES-SYNOVIAL FLD: 13 % (ref 0–20)
Monocyte-Macrophage-Synovial Fluid: 16 % — ABNORMAL LOW (ref 50–90)
Neutrophil, Synovial: 69 % — ABNORMAL HIGH (ref 0–25)

## 2015-09-25 LAB — SEDIMENTATION RATE: Sed Rate: 103 mm/hr — ABNORMAL HIGH (ref 0–22)

## 2015-09-25 MED ORDER — PREDNISONE 20 MG PO TABS: 40.0000 mg | ORAL_TABLET | Freq: Every day | ORAL | Status: DC

## 2015-09-25 MED ORDER — BUPIVACAINE HCL 0.5 % IJ SOLN
20.0000 mL | Freq: Once | INTRAMUSCULAR | Status: AC
  Administered 2015-09-25: 20 mL
  Filled 2015-09-25: qty 20

## 2015-09-25 MED ORDER — ONDANSETRON HCL 4 MG/2ML IJ SOLN
4.0000 mg | Freq: Once | INTRAMUSCULAR | Status: AC
  Administered 2015-09-25: 4 mg via INTRAVENOUS
  Filled 2015-09-25: qty 2

## 2015-09-25 MED ORDER — OXYCODONE-ACETAMINOPHEN 5-325 MG PO TABS: 1.0000 | ORAL_TABLET | ORAL | Status: DC | PRN

## 2015-09-25 MED ORDER — NAPROXEN 375 MG PO TABS: 375.0000 mg | ORAL_TABLET | Freq: Two times a day (BID) | ORAL | Status: DC

## 2015-09-25 MED ORDER — OXYCODONE-ACETAMINOPHEN 5-325 MG PO TABS
ORAL_TABLET | ORAL | Status: AC
  Filled 2015-09-25: qty 1

## 2015-09-25 MED ORDER — OXYCODONE-ACETAMINOPHEN 5-325 MG PO TABS
1.0000 | ORAL_TABLET | Freq: Once | ORAL | Status: AC
  Administered 2015-09-25: 1 via ORAL

## 2015-09-25 MED ORDER — OXYCODONE-ACETAMINOPHEN 5-325 MG PO TABS
2.0000 | ORAL_TABLET | Freq: Once | ORAL | Status: AC
  Administered 2015-09-25: 2 via ORAL
  Filled 2015-09-25: qty 2

## 2015-09-25 NOTE — ED Provider Notes (Signed)
CSN: [id number]     Arrival date & time 09/25/15  1119 History   First MD Initiated Contact with Patient 09/25/15 1519     Chief Complaint  Patient presents with  . Leg Pain     (Consider location/radiation/quality/duration/timing/severity/associated sxs/prior Treatment) HPI   Jenna May is a(n) 60 y.o. female who presents who presents to the ED with cc of Left Knee pain. She  has a past medical history of Hypertension; Tobacco abuse; Anxiety; Lipoma (03/2005); Umbilical hernia; Postmenopausal status; Bartholin cyst (06/2003); Normocytic anemia; and Gout. She has been told by her physcian she has gout.  10 days ago the patient developed swelling in the L knee. She states that it has worsened over the last week. She states she is unable to flex or extend the knee due to severe pain. She is unable to ambulate on the knee. She has had previous episodes of knee swelling, but this is the worst. She had an ultrasound of the leg done to rule out DVT which was negative, but did show possible Bakers cyst of the left knee.  Patient denies fevers, chills, myalgias or other signs of systemic infection.   Past Medical History  Diagnosis Date  . Hypertension   . Tobacco abuse     Quit 2009, after approximately 15 pack year smoking history.  . Anxiety   . 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 Umbilical hernia     S/P repair approximately 1995  . Postmenopausal status   . Bartholin cyst 06/2003  . Normocytic anemia     Unclear etiology. Anemia panel in 03/2010 showing Iron 53 , TIBC 303 , iron saturation percentage 17, ferritin 197 , B12 and folate within normal limits.  . Gout    Past Surgical History  Procedure Laterality Date  . Umbilical hernia repair  approximately 1995  . Appendectomy  approximately 1991  . Total abdominal hysterectomy  03/1996    2/2 fibroids   Family History  Problem Relation Age of Onset  . Hypertension Mother   . Diabetes Father    . Hypertension Father   . Hypertension Sister   . Heart attack Brother 49   Social History  Substance Use Topics  . Smoking status: Former Smoker -- 0.40 packs/day for 30 years    Types: Cigarettes    Quit date: 04/11/2008  . Smokeless tobacco: Never Used  . Alcohol Use: No   OB History    No data available     Review of Systems   Ten systems reviewed and are negative for acute change, except as noted in the HPI.   Allergies  Amlodipine  Home Medications   Prior to Admission medications   Medication Sig Start Date End Date Taking? Authorizing Provider  allopurinol (ZYLOPRIM) 300 MG tablet Take 1 tablet (300 mg total) by mouth daily. 05/03/15 05/02/16  Ejiroghene Arlyce Dice, MD  fish oil-omega-3 fatty acids 1000 MG capsule Take 2 capsules (2 g total) by mouth daily. 07/12/12   Maitri S Kalia-Reynolds, DO  loratadine (CLARITIN) 10 MG tablet Take 1 tablet (10 mg total) by mouth daily. 12/05/12 12/05/13  Maitri S Kalia-Reynolds, DO  losartan (COZAAR) 25 MG tablet Take 1 tablet (25 mg total) by mouth daily. 08/13/15   Iline Oven, MD  simvastatin (ZOCOR) 40 MG tablet Take 1 tablet (40 mg total) by mouth daily. 04/30/15   Ejiroghene Arlyce Dice, MD  triamcinolone ointment (KENALOG) 0.1 % Apply topically  2 (two) times daily. 11/08/14   Ejiroghene E Emokpae, MD   BP 152/77 mmHg  Pulse 89  Temp(Src) 98.4 F (36.9 C) (Oral)  Resp 16  SpO2 100%  LMP 09/29/1995 Physical Exam Physical Exam  Nursing note and vitals reviewed. Constitutional: She is oriented to person, place, and time. She appears well-developed and well-nourished. No distress.  HENT:  Head: Normocephalic and atraumatic.  Eyes: Conjunctivae normal and EOM are normal. Pupils are equal, round, and reactive to light. No scleral icterus.  Neck: Normal range of motion.  Cardiovascular: Normal rate, regular rhythm and normal heart sounds.  Exam reveals no gallop and no friction rub.   No murmur heard. Pulmonary/Chest:  Effort normal and breath sounds normal. No respiratory distress.  Abdominal: Soft. Bowel sounds are normal. She exhibits no distension and no mass. There is no tenderness. There is no guarding.  Musculoskeletal: Left knee with large joint effusion, positive ballottement, swelling into the popliteal fossa. It is exquisitely tender to palpation. Warm without erythema. Patient guards the knee and will not allow any bending of the knee at all.  Neurological: She is alert and oriented to person, place, and time.  Skin: Skin is warm and dry. She is not diaphoretic.    ED Course  .Joint Aspiration/Arthrocentesis Date/Time: 09/25/2015 5:00 PM Performed by: Margarita Mail Authorized by: Margarita Mail Consent: Verbal consent obtained. Written consent obtained. Risks and benefits: risks, benefits and alternatives were discussed Consent given by: patient Patient understanding: patient states understanding of the procedure being performed Patient consent: the patient's understanding of the procedure matches consent given Procedure consent: procedure consent matches procedure scheduled Relevant documents: relevant documents present and verified Site marked: the operative site was marked Patient identity confirmed: provided demographic data and verbally with patient Time out: Immediately prior to procedure a "time out" was called to verify the correct patient, procedure, equipment, support staff and site/side marked as required. Indications: joint swelling,  pain,  possible septic joint and diagnostic evaluation  Body area: knee Joint: left knee Local anesthesia used: yes Local anesthetic: bupivacaine 0.5% without epinephrine Anesthetic total: 5 ml Patient sedated: no Preparation: Patient was prepped and draped in the usual sterile fashion. Needle gauge: 18 G Ultrasound guidance: no Approach: superior Aspirate: bloody Aspirate amount: 15 mL Patient tolerance: Patient tolerated the procedure well  with no immediate complications   (including critical care time) Labs Review Labs Reviewed  CBC WITH DIFFERENTIAL/PLATELET - Abnormal; Notable for the following:    WBC 11.1 (*)    RBC 3.53 (*)    Hemoglobin 10.1 (*)    HCT 31.0 (*)    All other components within normal limits  COMPREHENSIVE METABOLIC PANEL - Abnormal; Notable for the following:    CO2 19 (*)    Glucose, Bld 104 (*)    Total Protein 8.4 (*)    Total Bilirubin 0.1 (*)    All other components within normal limits    Imaging Review No results found. I have personally reviewed and evaluated these images and lab results as part of my medical decision-making.   EKG Interpretation None      MDM   Final diagnoses:  Knee effusion, left    Patient's joint aspirate is bloody. She does have an elevated sedimentation rate. Unable to do a white count due to fibrin clumps in the synovial fluid. However, her Gram stain shows no organisms present. And she has a few white blood cells. A question whether she has some kind of inflammatory  arthritis. There are no crystals suggesting gout. I doubt a septic joint. The patient is afebrile, but she does have a slight white count. She didn't seen in shared visit with Dr. Johnney Killian. I have placed compression on the joint with a large Ace wrap, ice. Patient will be discharged with crutches, continue rice protocol, and she is asked to follow-up with orthopedist in the next 1-2 days. I discussed reasons to seek immediate marked medical care. She'll be discharged with pain medication, naproxen and a prednisone dose pack. She is able to flex the joint. A bit after pain control. Her safe for discharge at this time    Margarita Mail, PA-C 09/25/15 2005  Charlesetta Shanks, MD 09/27/15 [phone]

## 2015-09-25 NOTE — ED Notes (Signed)
Pt reports left leg swelling x 1.5 weeks with no injury. Extremity is swollen and warm compared to other. Pulses intact distal to injury.

## 2015-09-25 NOTE — Discharge Instructions (Signed)
YOU WILL NEED TO CALL and set up an appointment with the orthopedist in the next few days. Your knee elevated. Apply ice. Continue to use the Ace wrap to help decrease the swelling in the knee. Take the naproxen 2 times daily with food. Use the Percocet for pain relief. Do not use any other medications like Tylenol, Motrin and BC powders or other pain medicines. He can buy over-the-counter. With these medicines. Knee Arthrocentesis Knee arthrocentesis is a procedure to remove fluid from your knee joint. The knee joint is a closed space that is lined by a membrane (synovial membrane). This membrane makes fluid to lubricate your joint, but sometimes this space fills with blood or other fluids. This can cause pain, swelling, and stiffness in your knee. You may have this procedure to remove fluid from a swollen knee or to get a sample of knee fluid for testing. Testing your knee fluid can help your health care provider to determine the cause of your knee pain or swelling. It can also help your health care provider to diagnose diseases such as gout, arthritis, and infections. Removing excess fluid from your knee may also be done to relieve your symptoms. LET [facility] CARE PROVIDER KNOW ABOUT:  Any allergies you have.  All medicines you are taking, including vitamins, herbs, eye drops, creams, and over-the-counter medicines.  Previous problems you or members of your family have had with the use of anesthetics.  Any blood disorders you have.  Previous surgeries you have had.  Any medical conditions you may have. RISKS AND COMPLICATIONS Generally, this is a safe procedure. However, problems may occur, including:  Infection.  Bleeding.  Bruising.  Swelling.  Damage to your knee joint.  An allergic reaction to the numbing medicine. BEFORE THE PROCEDURE  Ask your health care provider about changing or stopping your regular medicines. This is especially important if you are taking diabetes  medicines or blood thinners.  Plan to have someone take you home after the procedure. PROCEDURE  You will sit or lie down in a position for your knee to be treated.  Your knee will be cleaned with a germ-killing solution (antiseptic).  You will be given a medicine that numbs the area (local anesthetic). You may feel some stinging.  After your knee becomes numb, a health care provider will insert a long, thin needle into the side of your knee joint. You may feel some pressure.  Your health care provider will pull back the syringe on the needle to remove fluid. Your health care provider may press on your knee to help remove the fluid.  At the end of the procedure, the needle will be removed.  A bandage (dressing) will be placed over the puncture site. The procedure may vary among health care providers and hospitals. AFTER THE PROCEDURE  Your blood pressure, heart rate, breathing rate, and blood oxygen level will be monitored often until the medicines you were given have worn off.   This information is not intended to replace advice given to you by your health care provider. Make sure you discuss any questions you have with your health care provider.   Document Released: 10/01/2006 Document Revised: 08/03/2014 Document Reviewed: 05/23/2014 Elsevier Interactive Patient Education 2016 Elsevier [employer]  Knee Effusion Knee effusion means that you have excess fluid in your knee joint. This can cause pain and swelling in your knee. This may make your knee more difficult to bend and move. That is because there is increased pain and  pressure in the joint. If there is fluid in your knee, it often means that something is wrong inside your knee, such as severe arthritis, abnormal inflammation, or an infection. Another common cause of knee effusion is an injury to the knee muscles, ligaments, or cartilage. HOME CARE INSTRUCTIONS  Use crutches as directed by your health care provider.  Wear a knee  brace as directed by your health care provider.  Apply ice to the swollen area:  Put ice in a plastic bag.  Place a towel between your skin and the bag.  Leave the ice on for 20 minutes, 2-3 times per day.  Keep your knee raised (elevated) when you are sitting or lying down.  Take medicines only as directed by your health care provider.  Do any rehabilitation or strengthening exercises as directed by your health care provider.  Rest your knee as directed by your health care provider. You may start doing your normal activities again when your health care provider approves.   Keep all follow-up visits as directed by your health care provider. This is important. SEEK MEDICAL CARE IF:  You have ongoing (persistent) pain in your knee. SEEK IMMEDIATE MEDICAL CARE IF:  You have increased swelling or redness of your knee.  You have severe pain in your knee.  You have a fever.   This information is not intended to replace advice given to you by your health care provider. Make sure you discuss any questions you have with your health care provider.   Document Released: 10/03/2003 Document Revised: 08/03/2014 Document Reviewed: 02/26/2014 Elsevier Interactive Patient Education [employer].

## 2015-09-25 NOTE — Progress Notes (Signed)
VASCULAR LAB PRELIMINARY  PRELIMINARY  PRELIMINARY  PRELIMINARY  Left lower extremity venous duplex completed.    Preliminary report:  Left:  No evidence of DVT, superficial thrombosis, or Baker's cyst.  Refoel Palladino, RVS 09/25/2015, 3:06 PM

## 2015-09-25 NOTE — ED Notes (Signed)
PA Harris at bedside  

## 2015-09-25 NOTE — ED Notes (Signed)
Patient taken to ultrasound and will be taken to room once scan is complete

## 2015-09-27 LAB — GLUCOSE, SYNOVIAL FLUID: GLUCOSE, SYNOVIAL FLUID: 94 mg/dL

## 2015-09-29 LAB — BODY FLUID CULTURE
Culture: NO GROWTH
Special Requests: NORMAL

## 2015-09-30 ENCOUNTER — Encounter: Payer: Self-pay | Admitting: Internal Medicine

## 2015-09-30 ENCOUNTER — Ambulatory Visit (INDEPENDENT_AMBULATORY_CARE_PROVIDER_SITE_OTHER): Payer: Self-pay | Admitting: Internal Medicine

## 2015-09-30 VITALS — BP 146/61 | HR 70 | Temp 97.8°F | Resp 18 | Ht 66.0 in | Wt 204.4 lb

## 2015-09-30 DIAGNOSIS — M109 Gout, unspecified: Secondary | ICD-10-CM

## 2015-09-30 DIAGNOSIS — M25462 Effusion, left knee: Secondary | ICD-10-CM

## 2015-09-30 DIAGNOSIS — E781 Pure hyperglyceridemia: Secondary | ICD-10-CM

## 2015-09-30 DIAGNOSIS — I10 Essential (primary) hypertension: Secondary | ICD-10-CM

## 2015-09-30 DIAGNOSIS — M25562 Pain in left knee: Secondary | ICD-10-CM

## 2015-09-30 MED ORDER — ALLOPURINOL 300 MG PO TABS: 300.0000 mg | ORAL_TABLET | Freq: Every day | ORAL | Status: DC

## 2015-09-30 MED ORDER — LOSARTAN POTASSIUM 25 MG PO TABS: 25.0000 mg | ORAL_TABLET | Freq: Every day | ORAL | Status: DC

## 2015-09-30 MED ORDER — SIMVASTATIN 40 MG PO TABS: 40.0000 mg | ORAL_TABLET | Freq: Every day | ORAL | Status: DC

## 2015-09-30 MED ORDER — PREDNISONE 20 MG PO TABS: 40.0000 mg | ORAL_TABLET | Freq: Every day | ORAL | Status: DC

## 2015-09-30 MED ORDER — OXYCODONE-ACETAMINOPHEN 5-325 MG PO TABS: 1.0000 | ORAL_TABLET | ORAL | Status: DC | PRN

## 2015-09-30 NOTE — Progress Notes (Signed)
Patient ID: Jenna May, female   DOB: 03-03-1956, 60 y.o.   MRN: [id number]   Subjective:   Patient ID: Jenna May female   DOB: [birth year] 60 y.o.   MRN: [id number]  HPI: [name] is a 60 y.o. with PMH liste dbelwo. Presented today with complainst of knee pain and swelling of about 3 weeks now. Please see problem based charting for assessment and plan, and follow up on pts chronic medical condition.  Past Medical History  Diagnosis Date  . Hypertension   . Tobacco abuse     Quit 2009, after approximately 15 pack year smoking history.  . Anxiety   . 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 Umbilical hernia     S/P repair approximately 1995  . Postmenopausal status   . Bartholin cyst 06/2003  . Normocytic anemia     Unclear etiology. Anemia panel in 03/2010 showing Iron 53 , TIBC 303 , iron saturation percentage 17, ferritin 197 , B12 and folate within normal limits.  . Gout    Review of Systems: CONSTITUTIONAL- No Fever, weightloss, actually gaining weight, night sweat or change in appetite. SKIN- No Rash, colour changes or itching. HEAD- No Headache or dizziness. Mouth/throat- No Sorethroat, dentures, or bleeding gums. RESPIRATORY- No Cough or SOB. CARDIAC- No Palpitations, or chest pain. GI- No  vomiting, diarrhoea,  abd pain. URINARY- No Frequency, or dysuria. NEUROLOGIC- No Numbness, or burning. [facility]- Denies depression or anxiety.  Objective:  Physical Exam: Filed Vitals:   09/30/15 0829  BP: 146/61  Pulse: 70  Temp: 97.8 F (36.6 C)  TempSrc: Oral  Resp: 18  Height: 5\' 6"  (1.676 m)  Weight: 204 lb 6.4 oz (92.715 kg)  SpO2: 100%   GENERAL- alert, co-operative, appears as stated age, not in any distress. HEENT- Atraumatic, normocephalic, PERRL, , oral mucosa appears moist CARDIAC- RRR, no murmurs, rubs or gallops. RESP- Moving equal volumes of air, and  no wheezes or crackles. ABDOMEN- Soft, nontender,bowel  sounds present. BACK- Normal curvature of the spine, No tenderness along the vertebrae NEURO- Alrt and oriented, Gait- Normal. EXTREMITIES- pulse 2+, symmetric,left LE- swelling and increased warmth of knee, redness, with restricted range of motion due to swelling and pain, pt on a wheel chair today, which is new, also +1 pitting pedal edema of leg, not present on feet, Right LE- normal, without pedal edema. no pedal edema. SKIN- Warm, dry, No rash or lesion. PSYCH- Normal mood and affect, appropriate thought content and speech.  Assessment & Plan:  The patient's case and plan of care was discussed with attending physician, Dr. Dareen Piano.  Please see problem based charting for assessment and plan.

## 2015-09-30 NOTE — Assessment & Plan Note (Signed)
Refilled simvastatin today. She has not ben totally complaint with her fish oil. Encouraged compliance. Check Lipid panel next visit.

## 2015-09-30 NOTE — Assessment & Plan Note (Addendum)
BP Readings from Last 3 Encounters:  09/30/15 146/61  09/25/15 130/67  08/13/15 137/77    Lab Results  Component Value Date   NA 137 09/25/2015   K 3.7 09/25/2015   CREATININE 0.77 09/25/2015    Assessment: Blood pressure control:  Sl;ighly increased today. But pt says she is ina lot of pain.  Comments: Cont meds- lorsatan 25mg  daily, she says the prior sympoms urinary frequency have resolved. - If Bp elevated at next visit will increase lorsatan, or consider adding HCTZ. - Also pt weight increasing, BMI- 33, patient has gained 10Lbs over the past year. Pt has not been very active due to knee pain. Encouraged to watch diet and increase activity.

## 2015-09-30 NOTE — Assessment & Plan Note (Signed)
Present for about 3 weeks now. Started when pt stood on her tip toes to reach for something. Pt went to the ED- 09/25/2015- had Knee aspiration down, which showed bloody fluid, with fibrin clumps so WBC count could not be done, No crystals and no organisms on gram stain or final culture results. Notably, ESR was elevated at 103. At this time ?etiology. She was prescribed Prednsione- 53m for 5 days, Percocets and Naproxen. Which pt says has helped and brought down pain and swelling.   Plan- Considering blood present in tap- consideration for trauma- Ligament tear, Vs meniscal rupture, also consider Connective tissue dx, with elevated ESR, but all her small joints all seem fine, she does endorse intermitent swelling or one joint or the other, but mostly on her lower extremities - Refferal to sports meds - Will prescribe another 7 days course of prednsione 490mdaily, then stop - Percocets [id number] 20 pills. - Will consider work up for connective tissue disease if seen Sports med and no etiology still identified.  - She has a chronic anemia, which etiology is still unknown.

## 2015-09-30 NOTE — Patient Instructions (Signed)
We will like you to see another doctor for your joint swelling and pain.   For now I will prescribe 7 more days of prednisone and pain meds. But we will not be refilling it.  We will see you in 1 month.

## 2015-10-03 NOTE — Progress Notes (Signed)
Internal Medicine Clinic Attending  Case discussed with Dr. Emokpae soon after the resident saw the patient.  We reviewed the resident's history and exam and pertinent patient test results.  I agree with the assessment, diagnosis, and plan of care documented in the resident's note. 

## 2015-10-09 ENCOUNTER — Other Ambulatory Visit: Payer: Self-pay | Admitting: Internal Medicine

## 2015-10-09 DIAGNOSIS — M109 Gout, unspecified: Secondary | ICD-10-CM

## 2015-10-09 NOTE — Telephone Encounter (Signed)
Pt requesting gout med [facility].

## 2015-10-09 NOTE — Telephone Encounter (Signed)
They have been filled and are waiting on [facility] up [facility]

## 2015-11-01 ENCOUNTER — Encounter: Payer: Self-pay | Admitting: Family Medicine

## 2015-11-01 ENCOUNTER — Ambulatory Visit
Admission: RE | Admit: 2015-11-01 | Discharge: 2015-11-01 | Disposition: A | Discharge status: Home / Self Care | Payer: No Typology Code available for payment source | Source: Ambulatory Visit | Attending: Family Medicine | Admitting: Family Medicine

## 2015-11-01 ENCOUNTER — Ambulatory Visit (INDEPENDENT_AMBULATORY_CARE_PROVIDER_SITE_OTHER): Payer: Self-pay | Admitting: Family Medicine

## 2015-11-01 VITALS — BP 144/74 | HR 79 | Ht 66.0 in | Wt 204.0 lb

## 2015-11-01 DIAGNOSIS — M25562 Pain in left knee: Secondary | ICD-10-CM

## 2015-11-01 MED ORDER — TRAMADOL HCL 50 MG PO TABS: 50.0000 mg | ORAL_TABLET | Freq: Four times a day (QID) | ORAL | Status: DC | PRN

## 2015-11-01 NOTE — Progress Notes (Addendum)
Jenna May - 60 y.o. female MRN [id number]  Date of birth: [birth year]  CC: Persistent left knee pain  SUBJECTIVE:   HPI Mrs. Jenna May is a very pleasant 60 year old female here with her husband who was referred here for persistent left knee pain. She has started been evaluated by her PCP as well as the ER each roughly one month ago. Her knee pain began around 09/15/2015. She denies any trauma. She states that her knee just started swelling and became painful to bear weight. She was seen in the ER on 09/25/2015. She had an aspiration of her knee which showed bloody aspirate with an ESR of of 103 at the time although she has had an elevated ESR in the past of 113 3 years ago.er CBC at the time showed a mildly elevated white blood cell count was 11.1 with a normal differential. She had no crystals in the fluid. She does have a history of gout and is taking allopurinol. She has had one known gouty attack in her life. She is given pain medication, naproxen, and a prednisone Dosepak.  She followed up with her PCP 5 days later.  ROS:     As above in history of present illness no fevers chills or night sweats. No other joint swelling. No new rashes. No loss of appetite or unexplained weight loss. She states she is otherwise feeling well besides her left knee pain.   HISTORY: Past Medical, Surgical, Social, and Family History Reviewed & Updated per EMR.  Pertinent Historical Findings include: HTN Gout No personal hx DM  OBJECTIVE: BP 144/74 mmHg  Pulse 79  Ht _0  (1.676 m)  Wt 204 lb (92.534 kg)  BMI 32.94 kg/m2  LMP 09/29/1995  Physical Exam  WD WN NAD LEFT:Knee slight effusion. TTP medial joint line FROM flex and extension Calf is soft NEURO: intact sensation soft touch VASC: dorsalis pedis pulses 2+  SKIN no rash, no lesions, no erythema or unusual warmth over left knee  Ultrasound: Long and short axis views of the left knee shows a mixed hyper/hypoechoic fluid collection in the  suprapatellar pouch which is not readily compressible. 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   No obvious meniscal, quad tend, PT abnormality.    MEDICATIONS, LABS & OTHER ORDERS: Previous Medications   ALLOPURINOL (ZYLOPRIM) 300 MG TABLET    Take 1 tablet (300 mg total) by mouth daily.   FISH OIL-OMEGA-3 FATTY ACIDS 1000 MG CAPSULE    Take 2 capsules (2 g total) by mouth daily.   LOSARTAN (COZAAR) 25 MG TABLET    Take 1 tablet (25 mg total) by mouth daily.   MENTHOL-METHYL SALICYLATE (MUSCLE RUB) 10-15 % CREA    Apply 1 application topically as needed for muscle pain.   MULTIPLE VITAMIN (MULTIVITAMIN WITH MINERALS) TABS TABLET    Take 1 tablet by mouth daily.   NAPROXEN (NAPROSYN) 375 MG TABLET    Take 1 tablet (375 mg total) by mouth 2 (two) times daily.   OXYCODONE-ACETAMINOPHEN (PERCOCET) 5-325 MG TABLET    Take 1-2 tablets by mouth every 4 (four) hours as needed.   PREDNISONE (DELTASONE) 20 MG TABLET    Take 2 tablets (40 mg total) by mouth daily.   SIMVASTATIN (ZOCOR) 40 MG TABLET    Take 1 tablet (40 mg total) by mouth daily.   TRIAMCINOLONE OINTMENT (KENALOG) 0.1 %    Apply topically 2 (two)  times daily.   Modified Medications   No medications on file   New Prescriptions   TRAMADOL (ULTRAM) 50 MG TABLET    Take 1 tablet (50 mg total) by mouth every 6 (six) hours as needed.   Discontinued Medications   No medications on file   Orders Placed This Encounter  Procedures  . DG Knee Complete 4 Views Left  . MR Knee Left  Wo Contrast   ASSESSMENT & PLAN: Persistent left knee pain with effusion: u/s and exam consistent with intraarticular bloody effusion that has recurred.  Possible benign synovial proliferative  Disorder. MRI required for further eval.

## 2015-11-04 NOTE — Assessment & Plan Note (Signed)
Need MRI to further eval. Either meniscal pathology or synovial pathology are ny top 2 items on differential.

## 2015-11-04 NOTE — Progress Notes (Signed)
Patient ID: Jenna May, female   DOB: 1956-06-27, 60 y.o.   MRN: [id number] [facility]: Attending Note: I have reviewed the chart, discussed wit the Sports Medicine Fellow. I agree with assessment and treatment plan as detailed in the [place] note.

## 2015-11-05 ENCOUNTER — Encounter: Payer: Self-pay | Admitting: Family Medicine

## 2015-11-09 ENCOUNTER — Ambulatory Visit
Admission: RE | Admit: 2015-11-09 | Discharge: 2015-11-09 | Disposition: A | Discharge status: Home / Self Care | Payer: No Typology Code available for payment source | Source: Ambulatory Visit | Attending: Family Medicine | Admitting: Family Medicine

## 2015-11-09 DIAGNOSIS — M25562 Pain in left knee: Secondary | ICD-10-CM

## 2015-11-11 ENCOUNTER — Telehealth: Payer: Self-pay | Admitting: Family Medicine

## 2015-11-11 NOTE — Telephone Encounter (Signed)
[facility] Marlou Sa Thursday 11/14/15 9am [address] [place], [place] [address] Phone: [phone]  Patient informed

## 2015-11-11 NOTE — Telephone Encounter (Signed)
[place] I saw her MRI. She has a meniscal tear AND she has pretty significant synovitis (inflammationof her joint capsule---not sure why this is. The synovitis complicates things). I would like her to see an orthopedist. Can u set that up and let her know? THANKS! Dorcas Mcmurray

## 2015-11-15 ENCOUNTER — Telehealth: Payer: Self-pay | Admitting: Internal Medicine

## 2015-11-15 NOTE — Telephone Encounter (Signed)
APPT. REMINDER CALL, LMTCB °

## 2015-11-18 ENCOUNTER — Ambulatory Visit: Payer: No Typology Code available for payment source

## 2016-01-08 ENCOUNTER — Encounter: Payer: Self-pay | Admitting: *Deleted

## 2016-01-24 ENCOUNTER — Ambulatory Visit (INDEPENDENT_AMBULATORY_CARE_PROVIDER_SITE_OTHER): Payer: No Typology Code available for payment source | Admitting: Internal Medicine

## 2016-01-24 ENCOUNTER — Encounter: Payer: Self-pay | Admitting: Internal Medicine

## 2016-01-24 VITALS — BP 162/81 | HR 76 | Temp 98.6°F | Ht 66.0 in | Wt 196.9 lb

## 2016-01-24 DIAGNOSIS — R011 Cardiac murmur, unspecified: Secondary | ICD-10-CM

## 2016-01-24 DIAGNOSIS — M25562 Pain in left knee: Secondary | ICD-10-CM

## 2016-01-24 DIAGNOSIS — M109 Gout, unspecified: Secondary | ICD-10-CM

## 2016-01-24 DIAGNOSIS — D649 Anemia, unspecified: Secondary | ICD-10-CM

## 2016-01-24 DIAGNOSIS — R42 Dizziness and giddiness: Secondary | ICD-10-CM

## 2016-01-24 DIAGNOSIS — E781 Pure hyperglyceridemia: Secondary | ICD-10-CM

## 2016-01-24 DIAGNOSIS — I10 Essential (primary) hypertension: Secondary | ICD-10-CM

## 2016-01-24 DIAGNOSIS — D6489 Other specified anemias: Secondary | ICD-10-CM

## 2016-01-24 DIAGNOSIS — M7989 Other specified soft tissue disorders: Secondary | ICD-10-CM

## 2016-01-24 MED ORDER — LOSARTAN POTASSIUM 50 MG PO TABS: 25.0000 mg | ORAL_TABLET | Freq: Every day | ORAL | Status: DC

## 2016-01-24 MED ORDER — ALLOPURINOL 300 MG PO TABS: 300.0000 mg | ORAL_TABLET | Freq: Every day | ORAL | Status: DC

## 2016-01-24 MED ORDER — SIMVASTATIN 40 MG PO TABS: 40.0000 mg | ORAL_TABLET | Freq: Every day | ORAL | Status: DC

## 2016-01-24 NOTE — Patient Instructions (Signed)
We will be ordering some lab tests on you today to find out why you are dizzy. We will not adjust your blood pressure meds today.

## 2016-01-24 NOTE — Progress Notes (Signed)
Medicine attending: Medical history, presenting problems, physical findings, and medications, reviewed with resident physician Dr Denton Brick on the day of the patient visit and I concur with [facility] evaluation and management plan.

## 2016-01-24 NOTE — Progress Notes (Signed)
Patient ID: Jenna May, female   DOB: 05/11/1956, 60 y.o.   MRN: [id number]   CC:   HPI: Jenna May is a 60 y.o. with PMH of gout, HTN, normocytic anemia. Presented today with complainst of dizziness and persistent knee swelling.   Past Medical History  Diagnosis Date  . Hypertension   . Tobacco abuse     Quit 2009, after approximately 15 pack year smoking history.  . Anxiety   . 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 Umbilical hernia     S/P repair approximately 1995  . Postmenopausal status   . Bartholin cyst 06/2003  . Normocytic anemia     Unclear etiology. Anemia panel in 03/2010 showing Iron 53 , TIBC 303 , iron saturation percentage 17, ferritin 197 , B12 and folate within normal limits.  . Gout    Review of Systems: CONSTITUTIONAL- No Fever, or change in appetite. SKIN- No Rash, colour changes or itching. HEAD- No Headache or dizziness. RESPIRATORY- No Cough or SOB. CARDIAC- No Palpitations, or chest pain. GI- No vomiting, diarrhoea, abd pain. URINARY- No Frequency, or dysuria. NEUROLOGIC- No Numbness, syncope, seizures or burning.  Physical Exam: Filed Vitals:   01/24/16 0922 01/24/16 0929  BP: 147/85 162/81  Pulse: 76   Temp: 98.6 F (37 C)   TempSrc: Oral   Height: 5\' 6"  (1.676 m)   Weight: 196 lb 14.4 oz (89.313 kg)   SpO2: 100%    GENERAL- alert, co-operative, appears as stated age, not in any distress. HEENT- Atraumatic, normocephalic, PERRL CARDIAC- Regular, 3/6 systolic murmur right upper sternal border RESP- Moving equal volumes of air, and clear to auscultation bilaterally, no wheezes or crackles. ABDOMEN- Soft, nontender, no palpable masses or organomegaly, bowel sounds present. NEURO- No obvious Cr N abnormality, strenght upper and lower extremities, heel to chin normal, Gait- Normal. EXTREMITIES- pulse 2+, symmetric, trace pitting edema left lower extremity, also wearing a brace- upper leg. Left knee  appears warm, with slight effusion. SKIN- Warm, dry, No rash or lesion.  Assessment & Plan:  See encounters tab for problem based medical decision making. Patient discussed with Dr. Beryle Beams

## 2016-01-24 NOTE — Assessment & Plan Note (Signed)
Will repeat CBC with ferritin, serum iron and TIBC. Also check IFE and light chains for possible myeloma, as the etiology of her anemia is still unknown.

## 2016-01-24 NOTE — Assessment & Plan Note (Addendum)
MRI showed tear involving the medial collateral ligament and the medial meniscus. She has gotten one steroid injection into this knee, with improvement.   When she was in the Ed -10/2015 for left knee pain, she had ESR drawn- elevated- 103, possibly related to hematoma in left knee. It was also elevated 4 years ago, when she was in the Ed for swelling of her left foot, thought to be due to gout.  Plan- Will repeat ESR - Cont alorpurinol, she ran out weeks ago.  - Considering - Knee is still warm with swelling today, prior elevated ESR, anemia?etiology, consider evaluating for connective tissue disease, inflammatory arthritis, with rheumatoid factor, CCP and checking CRP, ANA, next visit, OR re-tapping knee joint, if elevated ESR and persistent knee swelling and warmth. Though at this time she has only one joint involvement.

## 2016-01-24 NOTE — Assessment & Plan Note (Signed)
Patients dizziness started 6/10, about 3 weeks ago, she knows because she started taking double her dose of Losatan that day, thinking it was because her blood pressure was high. Dizziness is present only when standing, and does not get better while standing, not related to position of her head. No associated nausea or vomiting. She says she almost fell once from been dizzy. It is present during the day and at night.  No chest pain, or SOB. She denies any ear fullness, or hearing loss, she denies vertigo. She denies dark stools or blood in stools. No vomiting or diarrhea.  On exam- she has a murmur, she does not appear dehydrated.  Plan- Will get an ECHO- Possible aortic stenosis, but she does not have chest pain, SOB. - negative orthostatic vitals.  - Doubt CVA, as dizziness is present only when standing, gait is normal. - CBC- check Hgb, she has chronic anemia.  - Bmet-  Cr, electrolytes.

## 2016-01-25 LAB — BMP8+ANION GAP
Anion Gap: 20 mmol/L — ABNORMAL HIGH (ref 10.0–18.0)
BUN / CREAT RATIO: 26 — AB (ref 9–23)
BUN: 16 mg/dL (ref 6–24)
CALCIUM: 10.2 mg/dL (ref 8.7–10.2)
CHLORIDE: 100 mmol/L (ref 96–106)
CO2: 19 mmol/L (ref 18–29)
Creatinine, Ser: 0.62 mg/dL (ref 0.57–1.00)
GFR calc Af Amer: 116 mL/min/{1.73_m2} (ref >59)
GFR calc non Af Amer: 100 mL/min/{1.73_m2} (ref >59)
GLUCOSE: 100 mg/dL — AB (ref 65–99)
Potassium: 4 mmol/L (ref 3.5–5.2)
Sodium: 139 mmol/L (ref 134–144)

## 2016-01-25 LAB — CBC
HEMATOCRIT: 32.7 % — AB (ref 34.0–46.6)
HEMOGLOBIN: 10.2 g/dL — AB (ref 11.1–15.9)
MCH: 27 pg (ref 26.6–33.0)
MCHC: 31.2 g/dL — ABNORMAL LOW (ref 31.5–35.7)
MCV: 87 fL (ref 79–97)
Platelets: 206 10*3/uL (ref 150–379)
RBC: 3.78 x10E6/uL (ref 3.77–5.28)
RDW: 15.3 % (ref 12.3–15.4)
WBC: 8.4 10*3/uL (ref 3.4–10.8)

## 2016-01-25 LAB — SEDIMENTATION RATE: Sed Rate: 41 mm/hr — ABNORMAL HIGH (ref 0–40)

## 2016-01-25 LAB — IRON AND TIBC
Iron Saturation: 20 % (ref 15–55)
Iron: 60 ug/dL (ref 27–159)
TIBC: 294 ug/dL (ref 250–450)
UIBC: 234 ug/dL (ref 131–425)

## 2016-01-25 LAB — FERRITIN: Ferritin: 211 ng/mL — ABNORMAL HIGH (ref 15–150)

## 2016-01-27 ENCOUNTER — Telehealth: Payer: Self-pay | Admitting: *Deleted

## 2016-01-27 LAB — KAPPA/LAMBDA LIGHT CHAINS
IG KAPPA FREE LIGHT CHAIN: 16.8 mg/L (ref 3.3–19.4)
Ig Lambda Free Light Chain: 11.6 mg/L (ref 5.7–26.3)
Kappa/Lambda FluidC Ratio: 1.45 (ref 0.26–1.65)

## 2016-01-27 NOTE — Telephone Encounter (Signed)
Patient calling because [facility] does not have her "gout and blood pressure" medicines. Called medicines into [facility] pharmacy. Patient aware

## 2016-01-29 LAB — IMMUNOFIXATION, SERUM
IgA/Immunoglobulin A, Serum: 264 mg/dL (ref 87–352)
IgG (Immunoglobin G), Serum: 1398 mg/dL (ref 700–1600)
IgM (Immunoglobulin M), Srm: 102 mg/dL (ref 26–217)

## 2016-02-05 ENCOUNTER — Ambulatory Visit (HOSPITAL_COMMUNITY)
Admission: RE | Admit: 2016-02-05 | Discharge: 2016-02-05 | Disposition: A | Discharge status: Home / Self Care | Payer: No Typology Code available for payment source | Source: Ambulatory Visit | Attending: Internal Medicine | Admitting: Internal Medicine

## 2016-02-05 DIAGNOSIS — I10 Essential (primary) hypertension: Secondary | ICD-10-CM | POA: Insufficient documentation

## 2016-02-05 DIAGNOSIS — R42 Dizziness and giddiness: Secondary | ICD-10-CM

## 2016-02-05 DIAGNOSIS — D6489 Other specified anemias: Secondary | ICD-10-CM | POA: Insufficient documentation

## 2016-02-05 DIAGNOSIS — R012 Other cardiac sounds: Secondary | ICD-10-CM | POA: Insufficient documentation

## 2016-02-05 NOTE — Progress Notes (Signed)
Echocardiogram 2D Echocardiogram has been performed.  Jenna May 02/05/2016, 10:57 AM

## 2016-02-06 ENCOUNTER — Encounter: Payer: Self-pay | Admitting: Student in an Organized Health Care Education/Training Program

## 2016-02-24 ENCOUNTER — Encounter: Payer: Self-pay | Admitting: Internal Medicine

## 2016-02-27 ENCOUNTER — Encounter: Payer: Self-pay | Admitting: Internal Medicine

## 2016-02-27 ENCOUNTER — Ambulatory Visit (INDEPENDENT_AMBULATORY_CARE_PROVIDER_SITE_OTHER): Payer: Self-pay | Admitting: Internal Medicine

## 2016-02-27 VITALS — BP 150/75 | HR 70 | Temp 98.1°F | Ht 66.0 in | Wt 197.6 lb

## 2016-02-27 DIAGNOSIS — M109 Gout, unspecified: Secondary | ICD-10-CM

## 2016-02-27 DIAGNOSIS — Z0000 Encounter for general adult medical examination without abnormal findings: Secondary | ICD-10-CM

## 2016-02-27 DIAGNOSIS — D649 Anemia, unspecified: Secondary | ICD-10-CM

## 2016-02-27 DIAGNOSIS — I10 Essential (primary) hypertension: Secondary | ICD-10-CM

## 2016-02-27 DIAGNOSIS — M25562 Pain in left knee: Secondary | ICD-10-CM

## 2016-02-27 DIAGNOSIS — G8929 Other chronic pain: Secondary | ICD-10-CM

## 2016-02-27 MED ORDER — LOSARTAN POTASSIUM 50 MG PO TABS: 50.0000 mg | ORAL_TABLET | Freq: Every day | ORAL | 3 refills | Status: DC

## 2016-02-27 NOTE — Assessment & Plan Note (Signed)
Patient with chronic left knee pain. She recently had a steroid injection which improved her pain. She said the swelling and pain have improved over the last month. -- Continue to monitor

## 2016-02-27 NOTE — Assessment & Plan Note (Signed)
Patient with normocytic anemia of unknown etiology. Recently, patient had workup for multiple myeloma which was negative. She states she had bone marrow biopsy approximately 10 years ago which was normal. This anemia is causing the patient no symptoms. At this time the best test that would provide evidence as to the etiology of her anemia would be bone marrow biopsy. I discussed this with the patient and she was not amenable to this at this time. If her anemia continues to worsen she will need bone marrow biopsy and this should be discussed with her. I will order additional laboratory work today to evaluate for other potential etiologies however bone marrow biopsy would provide the most evidence in determining the etiology of her anemia. --ESR, CRP -- CBC -- ANA and rheumatoid factor -- RPR

## 2016-02-27 NOTE — Assessment & Plan Note (Signed)
Patient scheduled for mammogram at the [facility].

## 2016-02-27 NOTE — Progress Notes (Signed)
Medicine attending: I personally interviewed and briefly examined this patient on the day of the patient visit and reviewed pertinent clinical and ,laboratory data  with resident physician Dr. Ophelia Shoulder and we discussed a management plan. Medical problems currently stable. Here for [place] to sign paperwork for home services.

## 2016-02-27 NOTE — Assessment & Plan Note (Signed)
Patient with hypertension. At the visit today her blood pressure is 150/75. She takes her blood pressure home and states that it never runs this high at home. She denies shortness of breath or chest pain. She is out of her blood pressure medication which I will re-continue at this visit. -- Losartan 50 mg once daily -- Monitor blood pressure at home and record values -- Low-salt diet -- Diet and exercise

## 2016-02-27 NOTE — Patient Instructions (Signed)
It was a pleasure seeing you today. Thank you for choosing [facility] for your healthcare needs.  -- Continue blood pressure medication once per day -- Mammogram -- Return to clinic in 3 months for BP follow-up

## 2016-02-27 NOTE — Progress Notes (Signed)
   CC: Blood work follow-up HPI: [name] is a 60 y.o. female with a h/o of essential hypertension, sub-clinical hypothyroidism, anxiety, normocytic anemia, gout and hypertriglyceridemia who presents for follow-up of recent blood work. She denies headaches, changes in vision, night sweats or weight loss. She denies shortness of breath, chest pain, nausea, vomiting or abdominal pain. She denies change with her bowel or bladder habits. She has no additional acute complaints or concerns today's visit.  Please see problem-based charting for status of medical issues pertinent to this visit.    Review of Systems: A complete ROS was negative except as per HPI.  Physical Exam: Vitals:   02/27/16 1432  BP: (!) 150/75  Pulse: 70  Temp: 98.1 F (36.7 C)  TempSrc: Oral  SpO2: 100%  Weight: 197 lb 9.6 oz (89.6 kg)  Height: 5\' 6"  (1.676 m)   BP (!) 150/75 (BP Location: Left Arm, Patient Position: Sitting, Cuff Size: Large)   Pulse 70   Temp 98.1 F (36.7 C) (Oral)   Ht 5\' 6"  (1.676 m)   Wt 197 lb 9.6 oz (89.6 kg)   LMP 09/29/1995   SpO2 100% Comment: room air  BMI 31.89 kg/m  General appearance: alert and cooperative Lungs: clear to auscultation bilaterally Heart: regular rate and rhythm, S1, S2 normal, no murmur, click, rub or gallop Abdomen: soft, non-tender; bowel sounds normal; no masses,  no organomegaly and No abdominal bruits auscultated Extremities: extremities normal, atraumatic, no cyanosis or edema  Assessment & Plan:  See encounters tab for problem based medical decision making. Patient seen with Dr. Beryle Beams  Signed: Ophelia Shoulder, MD 02/27/2016, 5:38 PM  Pager: [phone]

## 2016-02-28 ENCOUNTER — Telehealth: Payer: Self-pay | Admitting: *Deleted

## 2016-02-28 LAB — CBC
HEMATOCRIT: 33.4 % — AB (ref 34.0–46.6)
HEMOGLOBIN: 10.6 g/dL — AB (ref 11.1–15.9)
MCH: 27.8 pg (ref 26.6–33.0)
MCHC: 31.7 g/dL (ref 31.5–35.7)
MCV: 88 fL (ref 79–97)
Platelets: 198 10*3/uL (ref 150–379)
RBC: 3.81 x10E6/uL (ref 3.77–5.28)
RDW: 14.6 % (ref 12.3–15.4)
WBC: 8.3 10*3/uL (ref 3.4–10.8)

## 2016-02-28 LAB — RPR: RPR Ser Ql: NONREACTIVE

## 2016-02-28 LAB — SEDIMENTATION RATE: SED RATE: 27 mm/h (ref 0–40)

## 2016-02-28 LAB — RHEUMATOID FACTOR: Rhuematoid fact SerPl-aCnc: <10 IU/mL (ref 0.0–13.9)

## 2016-02-28 NOTE — Telephone Encounter (Signed)
Call to patient to discuss scheduling  of Mammogram.  Patient was given number to [facility] and to inform the scheduler that she has an [employer].  Patient voiced understanding of the plan.  Sander Nephew, RN 02/28/2016 11:40 AM

## 2016-03-02 ENCOUNTER — Other Ambulatory Visit: Payer: Self-pay | Admitting: Internal Medicine

## 2016-03-02 DIAGNOSIS — Z1231 Encounter for screening mammogram for malignant neoplasm of breast: Secondary | ICD-10-CM

## 2016-03-02 LAB — ANTINUCLEAR ANTIBODIES, IFA: ANA TITER 1: NEGATIVE

## 2016-03-02 NOTE — Telephone Encounter (Signed)
losartan (COZAAR) 50 MG tablet [place]

## 2016-03-02 NOTE — Telephone Encounter (Signed)
Called to House. Fax number has changed and they did not receive initial RX.

## 2016-03-11 ENCOUNTER — Ambulatory Visit
Admission: RE | Admit: 2016-03-11 | Discharge: 2016-03-11 | Disposition: A | Discharge status: Home / Self Care | Payer: No Typology Code available for payment source | Source: Ambulatory Visit | Attending: Internal Medicine | Admitting: Internal Medicine

## 2016-03-11 DIAGNOSIS — Z1231 Encounter for screening mammogram for malignant neoplasm of breast: Secondary | ICD-10-CM

## 2016-04-24 ENCOUNTER — Telehealth: Payer: Self-pay | Admitting: Internal Medicine

## 2016-04-24 NOTE — Telephone Encounter (Signed)
CALLED PT LMTCB TIME TO RENEW GCCN CARD

## 2016-05-19 ENCOUNTER — Ambulatory Visit: Payer: No Typology Code available for payment source

## 2016-06-04 ENCOUNTER — Telehealth: Payer: Self-pay | Admitting: Internal Medicine

## 2016-06-04 ENCOUNTER — Ambulatory Visit (INDEPENDENT_AMBULATORY_CARE_PROVIDER_SITE_OTHER): Payer: Self-pay | Admitting: Internal Medicine

## 2016-06-04 ENCOUNTER — Encounter: Payer: Self-pay | Admitting: Internal Medicine

## 2016-06-04 DIAGNOSIS — M109 Gout, unspecified: Secondary | ICD-10-CM

## 2016-06-04 DIAGNOSIS — Z87891 Personal history of nicotine dependence: Secondary | ICD-10-CM

## 2016-06-04 DIAGNOSIS — Z23 Encounter for immunization: Secondary | ICD-10-CM

## 2016-06-04 DIAGNOSIS — I10 Essential (primary) hypertension: Secondary | ICD-10-CM

## 2016-06-04 DIAGNOSIS — Z79899 Other long term (current) drug therapy: Secondary | ICD-10-CM

## 2016-06-04 DIAGNOSIS — E781 Pure hyperglyceridemia: Secondary | ICD-10-CM

## 2016-06-04 MED ORDER — SIMVASTATIN 40 MG PO TABS: 40.0000 mg | ORAL_TABLET | Freq: Every day | ORAL | 3 refills | Status: DC

## 2016-06-04 MED ORDER — LOSARTAN POTASSIUM 50 MG PO TABS: 50.0000 mg | ORAL_TABLET | Freq: Every day | ORAL | 11 refills | Status: DC

## 2016-06-04 NOTE — Patient Instructions (Signed)
It was a pleasure seeing you today. Thank you for choosing [facility] for your healthcare needs.   Please take 1 and a half tablets of the losartan (BP medicine).  Please start your gout medication back and take Naproxen twice a day for 1 week while you start the gout medicine. After one week continue with the gout medication but stop taking the Naproxen.  Continue you other medications as prescribed

## 2016-06-04 NOTE — Assessment & Plan Note (Signed)
She has not had a recent gout flare. However, she stopped taking her allopurinol approximately 6 weeks earlier. Her most recent uric acid level was around 6. A goal uric acid level would be less than 6 in a patient with a history of gout. I think it is important that she restarts her allopurinol. Since gout flares have been associated with either starting or stopping uric acid decreasing medication such as allopurinol I instructed the patient to take naproxen twice a day for one week while starting back allopurinol. After one week of naproxen and allopurinol she can discontinue the use of naproxen and continue with allopurinol. -- Naproxen twice a day for 1 week as a bridge while starting allopurinol -- Continue allopurinol 300 mg once daily

## 2016-06-04 NOTE — Progress Notes (Signed)
I saw and evaluated the patient.  I personally confirmed the key portions of Dr. Taylor's history and exam and reviewed pertinent patient test results.  The assessment, diagnosis, and plan were formulated together and I agree with the documentation in the resident's note. 

## 2016-06-04 NOTE — Progress Notes (Signed)
   CC: Chronic condition and management HPI: [name] is a 60 y.o. female with a h/o of hypertension, hypothyroidism, chronic anemia, gout and hypertriglyceridemia who presents as a follow-up of her chronic medical conditions. She denies shortness of breath or chest pain. She denies nausea, vomiting or abdominal pain. She denies diarrhea or constipation. She denies weight loss or increased fatigue.  She does complain of a "sensation" in her right lower abdominal quadrant which is been on and off for the past 2 weeks. She says she has to 3 episodes a day lasting approximately 1 minute. There is no radiation with this sensation. There is no pain. There is no nausea or vomiting. She has no red flag symptoms. Her abdominal examination was benign. I think the most likely etiology for this sensation is gas. I have instructed her to take over-the-counter medication such as Gas-X. I have also told her to continue to monitor the symptoms and if they worsen in severity, increase in frequency, or change in character to set up another clinic appointment for further evaluation.   Review of Systems: A complete ROS was negative except as per HPI.  Physical Exam: Vitals:   06/04/16 1343  BP: (!) 154/72  Pulse: 84  Temp: 98.7 F (37.1 C)  TempSrc: Oral  SpO2: 100%  Weight: 204 lb 11.2 oz (92.9 kg)  Height: 5\' 6"  (1.676 m)   Head: Normocephalic, without obvious abnormality, atraumatic Lungs: clear to auscultation bilaterally Heart: regular rate and rhythm, S1, S2 normal, no murmur, click, rub or gallop Abdomen: Bowel sounds normal, no abdominal bruits auscultated, periumbilical scar, inferior umbilical scar. No tenderness to palpation in any abdominal quadrant. No mass or organomegaly appreciated. Benign abdominal examination. Extremities: extremities normal, atraumatic, no cyanosis or edema  Assessment & Plan:  See encounters tab for problem based medical decision making. Patient seen with  Dr. Eppie Gibson  Signed: Ophelia Shoulder, MD 06/04/2016, 3:04 PM  Pager: [phone]

## 2016-06-04 NOTE — Telephone Encounter (Signed)
APT. REMINDER CALL, LMTCB °

## 2016-06-04 NOTE — Assessment & Plan Note (Signed)
Patient has a history of hypertension. She is currently not at goal. Her blood pressure in the office today was 154/72. At minimum I would like her pressure to be less than 150/90 at a more ideal goal to have a systolic pressure less than 140. 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 She is not a good candidate for diuretic therapy secondary to history of gout. Her dose of losartan is 50 mg once daily and we have room to go up on this. Additionally, the patient has a history of gout and losartan is associated with decreasing uric acid levels. Because losartan can continue to affectively lower her blood pressure and her uric acid levels I will increase the dosage of this medication. -- Increase losartan from 50 mg to 75 mg once daily, I instructed her to take 1.5 of the 50 mg tablets daily. -- Encouraged exercise, diet and weight loss

## 2016-06-04 NOTE — Assessment & Plan Note (Signed)
She has a history of hypertriglyceridemia. At this time we'll not repeat blood work but we will continue her on statin therapy. She is currently taking atorvastatin 40 mg once daily. She did request a refill for this medication. I have refilled it. She'll continue to take this medication once daily. -- Atorvastatin 40 mg once daily

## 2016-10-01 IMAGING — MR MR KNEE*L* W/O CM
4 of 5 series · 18 of 40 positions shown · non-contrast
Comparison: Radiographs 11/01/2015.

CLINICAL DATA: Left knee pain and swelling since falling 2 months
ago. Limited weight-bearing. No previous relevant surgery.

EXAM:
MRI OF THE LEFT KNEE WITHOUT CONTRAST
TECHNIQUE: Multiplanar, multisequence MR imaging of the knee was performed. No
intravenous contrast was administered.

[Series 3: PD fat-sat · axial · 3.5mm · 0.31mm/px · z∈[-28,+64]mm · 8 of 23 slices shown (1 of 3)]
[im 1/23]
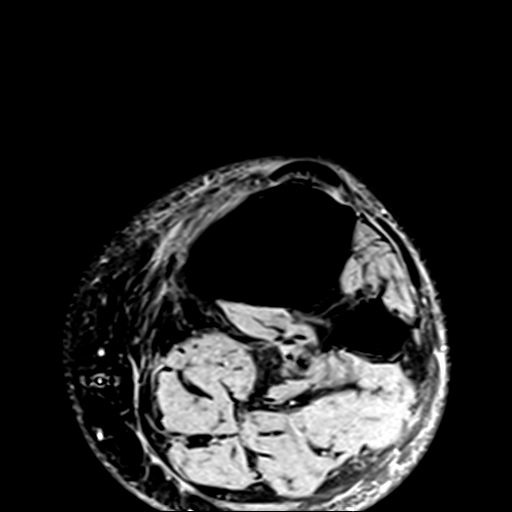
[im 4/23]
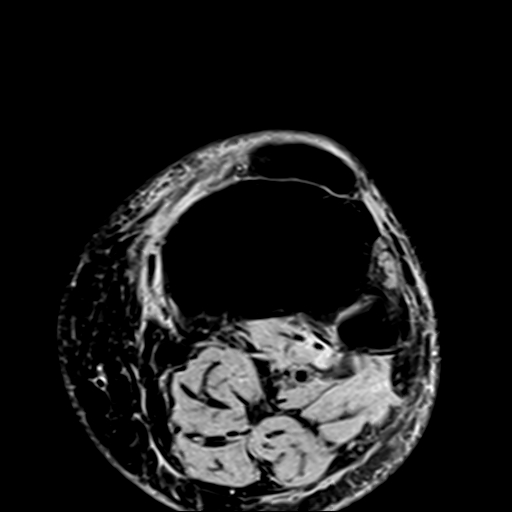
[im 7/23]
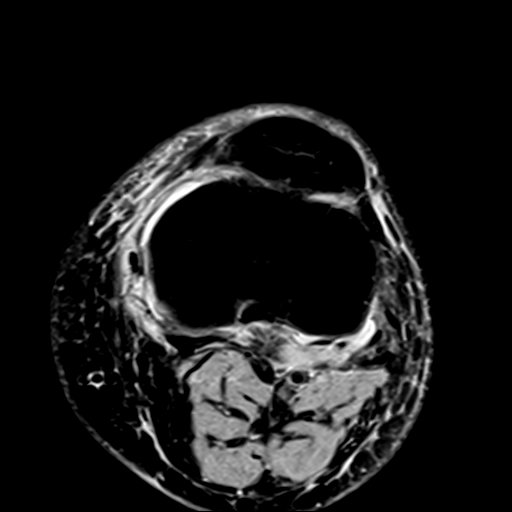
[im 10/23]
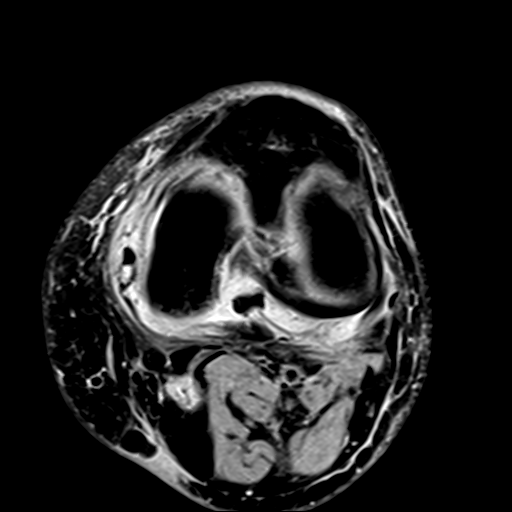
[im 13/23]
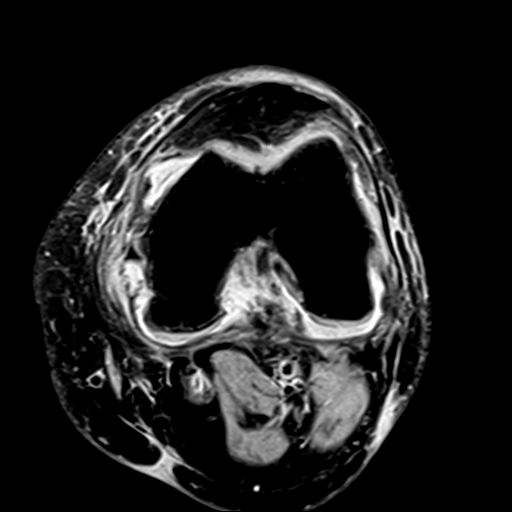
[im 16/23]
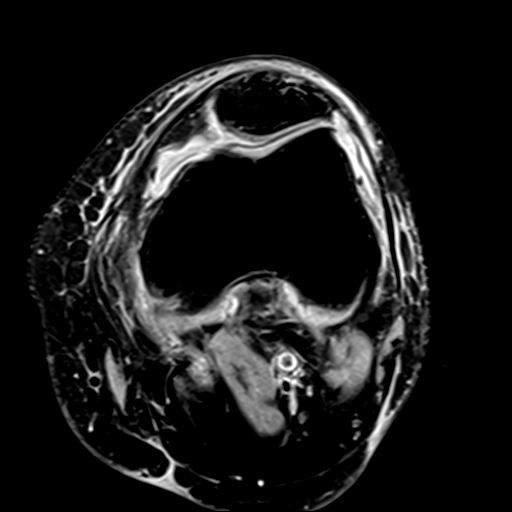
[im 19/23]
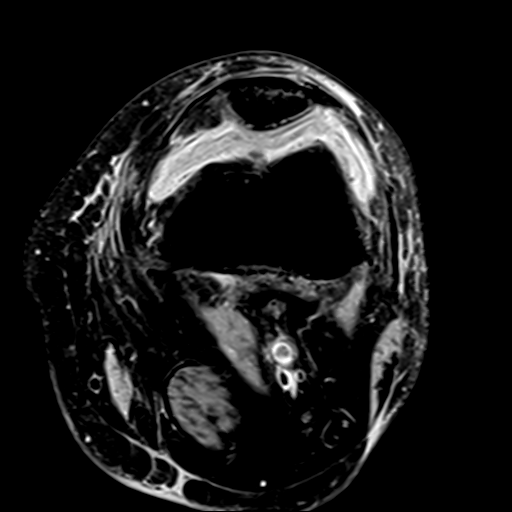
[im 23/23]
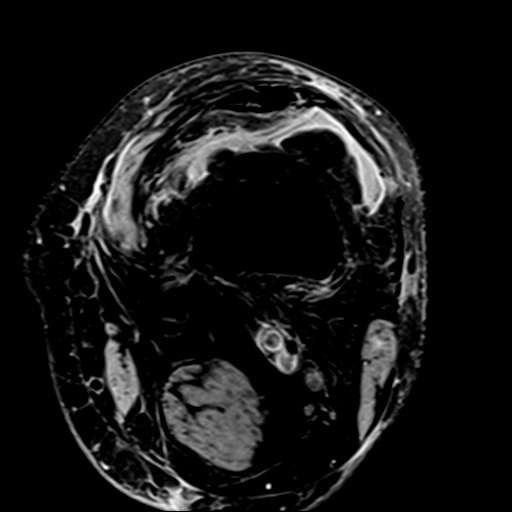

[Series 4: PD fat-sat · coronal · 3.5mm · 0.29mm/px · 4 of 28 slices shown (2 of 3)]
[im 1/28]
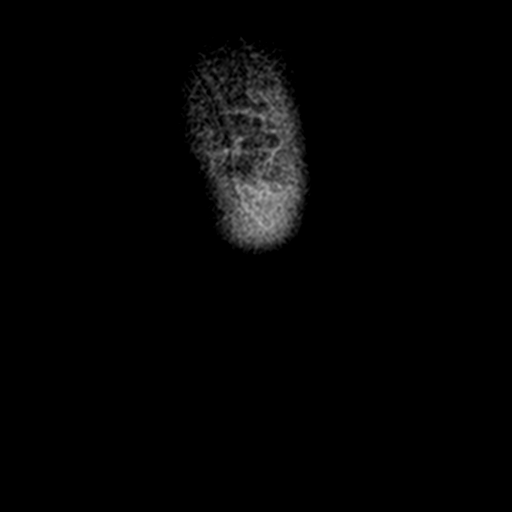
[im 4/28]
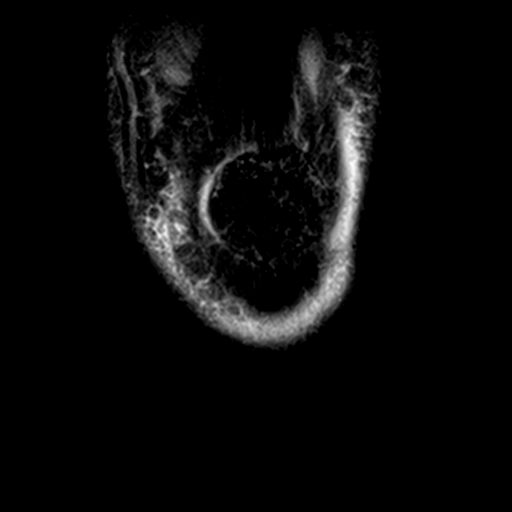
[im 16/28]
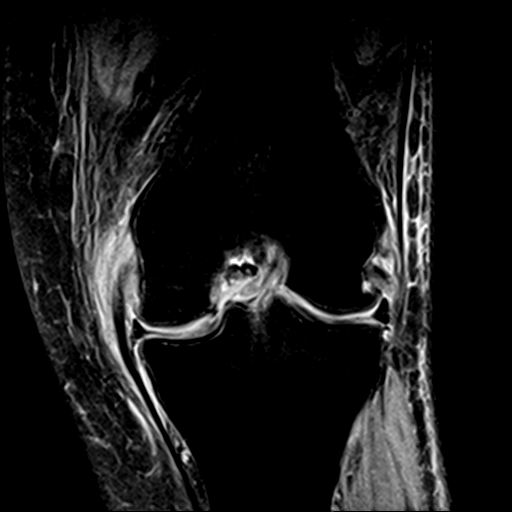
[im 24/28]
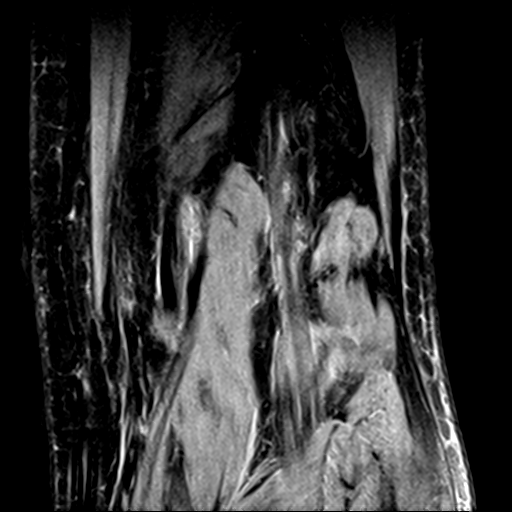

[Series 5: T2 fat-sat · coronal · 3.5mm · 0.29mm/px · 3 of 28 slices shown]
[im 4/28]
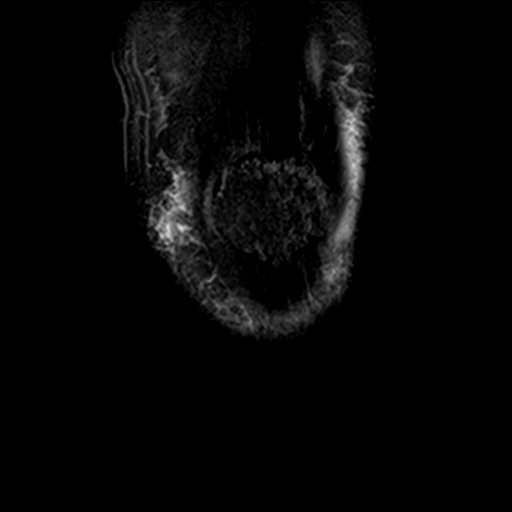
[im 16/28]
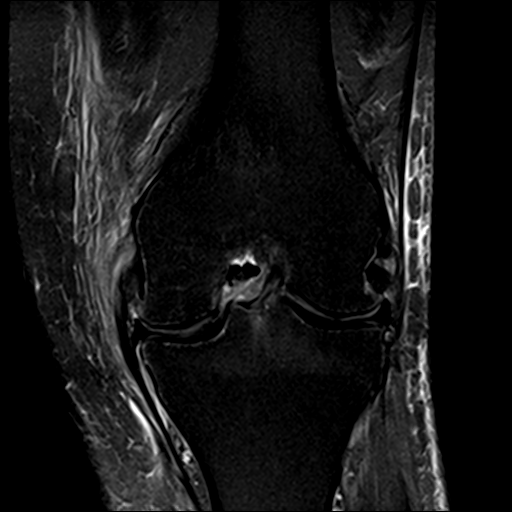
[im 24/28]
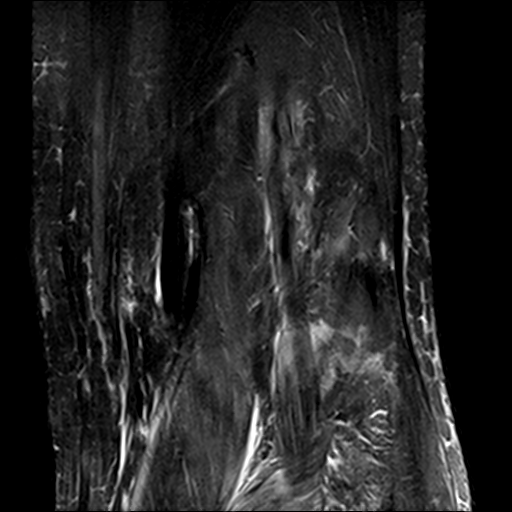

[Series 6: PD fat-sat · sagittal · 3.2mm · 0.29mm/px · 3 of 28 slices shown (3 of 3)]
[im 4/28]
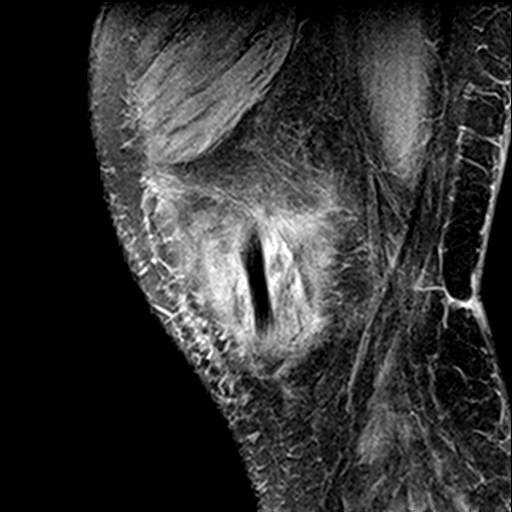
[im 16/28]
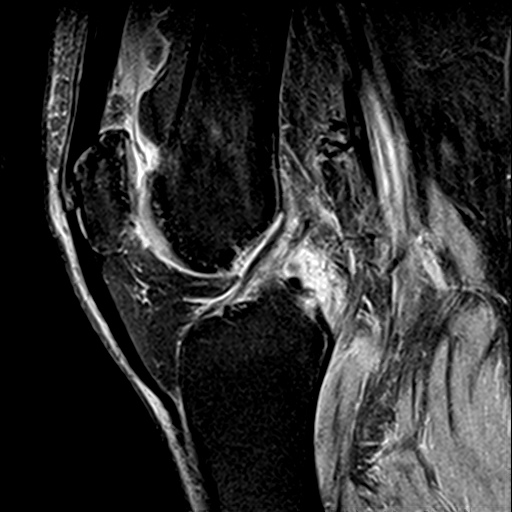
[im 24/28]
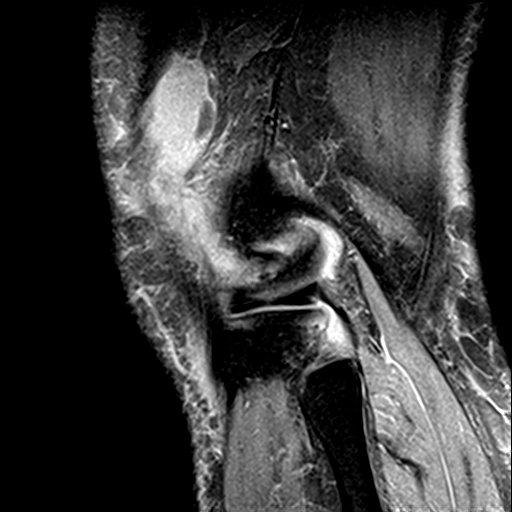

[18 of 40 positions shown; findings below may reference images not displayed]

FINDINGS: MENISCI

Medial meniscus: There is suspicion of a small peripheral oblique
undersurface tear of the posterior horn, seen on sagittal images 20
and 21. This is not clearly seen in the axial or coronal planes. No
displaced meniscal fragment or parameniscal cyst. The meniscal root
is intact.

Lateral meniscus:  Intact with normal morphology.

LIGAMENTS

Cruciates:  Intact.

Collaterals: There is a partial tear the medial collateral ligament
which is thickened and edematous. There is surrounding bursal fluid
with a component in the pes anserine bursa. The lateral collateral
ligament complex appears normal.

CARTILAGE

Patellofemoral:  Minimal chondral thinning without focal defect.

Medial:  Mild chondral thinning without focal defect.

Lateral:  Preserved.

OTHER

Joint: Moderate-sized joint effusion appears complex, suspicious for
synovitis.

Popliteal Fossa:  Small minimally complex Baker's cyst.

Extensor Mechanism: Intact. There is generalized subcutaneous edema
anteriorly with posterior extension along the patellar retinacula.

Bones: There is mottled periarticular T2 marrow hyperintensity which
is greatest within the patella, but also present to a lesser degree
in the distal femur and proximal tibia. No associated T1 signal
abnormality. This suggests hyperemia related to suspected synovitis
or disuse osteoporosis.
IMPRESSION: 1. Grade 2 tear of the medial collateral ligament with surrounding
bursal fluid.
2. Suspicion of small peripheral oblique undersurface tear of the
posterior horn of the medial meniscus.
3. The lateral meniscus, cruciate ligaments and lateral collateral
ligament complex appear normal.
4. Moderate-sized joint effusion with evidence of synovitis.
Probable associated reactive periarticular marrow edema.

## 2016-10-14 ENCOUNTER — Other Ambulatory Visit: Payer: Self-pay

## 2016-10-14 DIAGNOSIS — E781 Pure hyperglyceridemia: Secondary | ICD-10-CM

## 2016-10-14 NOTE — Telephone Encounter (Signed)
simvastatin (ZOCOR) 40 MG tablet, [facility]

## 2016-10-15 MED ORDER — SIMVASTATIN 40 MG PO TABS: 40.0000 mg | ORAL_TABLET | Freq: Every day | ORAL | 3 refills | Status: DC

## 2016-10-15 NOTE — Telephone Encounter (Signed)
Called to pharm 

## 2016-11-17 ENCOUNTER — Ambulatory Visit: Payer: Self-pay

## 2016-12-03 ENCOUNTER — Ambulatory Visit (INDEPENDENT_AMBULATORY_CARE_PROVIDER_SITE_OTHER): Payer: Self-pay | Admitting: Internal Medicine

## 2016-12-03 VITALS — BP 152/66 | HR 83 | Temp 98.3°F | Wt 208.4 lb

## 2016-12-03 DIAGNOSIS — K644 Residual hemorrhoidal skin tags: Secondary | ICD-10-CM

## 2016-12-03 DIAGNOSIS — Z87891 Personal history of nicotine dependence: Secondary | ICD-10-CM

## 2016-12-03 DIAGNOSIS — Z90710 Acquired absence of both cervix and uterus: Secondary | ICD-10-CM

## 2016-12-03 DIAGNOSIS — K649 Unspecified hemorrhoids: Secondary | ICD-10-CM | POA: Insufficient documentation

## 2016-12-03 DIAGNOSIS — Z0000 Encounter for general adult medical examination without abnormal findings: Secondary | ICD-10-CM

## 2016-12-03 DIAGNOSIS — Z79899 Other long term (current) drug therapy: Secondary | ICD-10-CM

## 2016-12-03 DIAGNOSIS — I10 Essential (primary) hypertension: Secondary | ICD-10-CM

## 2016-12-03 HISTORY — DX: Unspecified hemorrhoids: K64.9

## 2016-12-03 MED ORDER — LOSARTAN POTASSIUM 100 MG PO TABS: 100.0000 mg | ORAL_TABLET | Freq: Every day | ORAL | 3 refills | Status: DC

## 2016-12-03 NOTE — Progress Notes (Signed)
Internal Medicine Clinic Attending  I saw and evaluated the patient.  I personally confirmed the key portions of the history and exam documented by Dr. Taylor and I reviewed pertinent patient test results.  The assessment, diagnosis, and plan were formulated together and I agree with the documentation in the resident's note.  

## 2016-12-03 NOTE — Assessment & Plan Note (Signed)
The patient has done well taking her blood pressure medication. She is currently taking losartan 75 mg once daily. She is consistent and has good adherence. She has no medication side effects. Her blood pressure today is still elevated 143/83. I will increase her losartan from 75 mg once daily to 100 mg once daily. In addition to providing better blood pressure control it should also be easier for the patient to take as she is currently using 1.5 tablets of the 50 mg dose. She was agreeable to this plan. I will increase to 100 mg once daily and have the patient follow-up in clinic in 3 months. -- Increase losartan to 100 mg once daily -- Return to clinic in 3 months -- BMP today

## 2016-12-03 NOTE — Assessment & Plan Note (Signed)
Patient endorses intermittent rectal burning over the previous several months. This is relatively overall infrequent. She has also noticed some minimal blood streaking on toilet paper. No gross blood in the toilet. No change in stool consistency or caliber. No weight loss or abdominal pain. No red flag symptoms. She has a history of hemorrhoidal disease. Rectal examination today shows a single small external thrombosed hemorrhoid. I think the most likely etiology of the patient's rectal burning is secondary to hemorrhoidal disease. No evidence of rectal fissure. No mass seen on external examination. I will recommend the patient avoid straining and takes a laxative if she develops constipation in the future. -- Encourage good bowel habits

## 2016-12-03 NOTE — Assessment & Plan Note (Addendum)
The patient has a hysterectomy. This was performed in 1999. On chart review she has had a Pap smear documented in 2003 in which the endocervical transition zone was present on cytology. This would indicate that she did not have a total hysterectomy and still has a cervix. Therefore, she will need cervical cancer screening. We'll proceed with Pap smear today. The patient had a mammogram in August 2017 and was BI-RADS 1. She'll need a mammogram this August. -- Pap smear with reflexive HPV testing -- Mammogram in August, please refer when she returns in 3 months for her blood pressure management appointment  ADDENDUM During examination and Pap smear cervix was unable to be identified. It appeared the speculum met resistance at the vaginal cuff. Patient had a prior hysterectomy but chart review is unclear whether this was a total hysterectomy or partial hysterectomy. We will still send swab for cytology. We'll try to follow-up and get records if possible from her surgery that occurred in 1999.

## 2016-12-03 NOTE — Patient Instructions (Signed)
It was a pleasure seeing you today. Thank you for choosing [facility] for your healthcare needs.   I have increased your losartan from 75 mg daily to 100 mg once daily. You are to take 1 pill every day for the treatment of your blood pressure.  Please work on a diet high in fiber and avoid straining when using the restroom. If you develop constipation you may try over-the-counter laxatives or fiber supplementation.  Please return to clinic in 3 months to check on your blood pressure and medicines. If you have anything that you need to be seen before 3 months please call the clinic and schedule an appointment.

## 2016-12-03 NOTE — Progress Notes (Signed)
   CC: Rectal burning HPI: [name] is a 61 y.o. female with a h/o of essential hypertension, subclinical hypothyroidism, anxiety, normocytic anemia, gout and hypertriglyceridemia who presents with rectal burning.    Review of Systems: The patient denies nausea, vomiting or abdominal pain. She denies chest pain or shortness of breath. She denies polyuria or dysuria.   Physical Exam: Vitals:   12/03/16 1327  BP: (!) 143/83  Pulse: 83  Temp: 98.3 F (36.8 C)  TempSrc: Oral  SpO2: 100%  Weight: 208 lb 6.4 oz (94.5 kg)   BP (!) 143/83 (BP Location: Left Arm, Patient Position: Sitting, Cuff Size: Large)   Pulse 83   Temp 98.3 F (36.8 C) (Oral)   Wt 208 lb 6.4 oz (94.5 kg)   LMP 09/29/1995   SpO2 100%   BMI 33.64 kg/m  General appearance: alert and cooperative Lungs: clear to auscultation bilaterally Heart: regular rate and rhythm, S1, S2 normal, no murmur, click, rub or gallop Abdomen: soft, non-tender; bowel sounds normal; no masses,  no organomegaly Pelvic: external genitalia normal and Cervix unable to be identified on examination. No lesions or masses in the vaginal vault. Rectal examination showed a small external hemorrhoid. Extremities: extremities normal, atraumatic, no cyanosis or edema  Assessment & Plan:  See encounters tab for problem based medical decision making. Patient seen with Dr. Dareen Piano  Signed: Ophelia Shoulder, MD 12/03/2016, 1:35 PM  Pager: [phone]

## 2016-12-04 LAB — BMP8+ANION GAP
ANION GAP: 17 mmol/L (ref 10.0–18.0)
BUN/Creatinine Ratio: 14 (ref 9–23)
BUN: 10 mg/dL (ref 6–24)
CALCIUM: 9.7 mg/dL (ref 8.7–10.2)
CO2: 20 mmol/L (ref 18–29)
CREATININE: 0.72 mg/dL (ref 0.57–1.00)
Chloride: 102 mmol/L (ref 96–106)
GFR calc Af Amer: 107 mL/min/{1.73_m2} (ref >59)
GFR calc non Af Amer: 93 mL/min/{1.73_m2} (ref >59)
Glucose: 109 mg/dL — ABNORMAL HIGH (ref 65–99)
Potassium: 4 mmol/L (ref 3.5–5.2)
Sodium: 139 mmol/L (ref 134–144)

## 2016-12-08 LAB — CYTOLOGY - PAP
Diagnosis: NEGATIVE
HPV: NOT DETECTED

## 2016-12-17 ENCOUNTER — Other Ambulatory Visit: Payer: Self-pay | Admitting: Internal Medicine

## 2017-01-04 ENCOUNTER — Encounter: Payer: Self-pay | Admitting: *Deleted

## 2017-02-15 ENCOUNTER — Other Ambulatory Visit: Payer: Self-pay | Admitting: Internal Medicine

## 2017-02-15 DIAGNOSIS — E781 Pure hyperglyceridemia: Secondary | ICD-10-CM

## 2017-02-15 MED ORDER — SIMVASTATIN 40 MG PO TABS: 40.0000 mg | ORAL_TABLET | Freq: Every day | ORAL | 1 refills | Status: DC

## 2017-02-15 NOTE — Telephone Encounter (Signed)
NEEDS REFILL TO [facility] PHARMACY

## 2017-02-15 NOTE — Telephone Encounter (Signed)
Faxed script

## 2017-03-11 ENCOUNTER — Other Ambulatory Visit: Payer: Self-pay | Admitting: Internal Medicine

## 2017-04-08 ENCOUNTER — Other Ambulatory Visit: Payer: Self-pay | Admitting: Internal Medicine

## 2017-04-08 DIAGNOSIS — Z1231 Encounter for screening mammogram for malignant neoplasm of breast: Secondary | ICD-10-CM

## 2017-05-13 ENCOUNTER — Ambulatory Visit (INDEPENDENT_AMBULATORY_CARE_PROVIDER_SITE_OTHER): Payer: Self-pay | Admitting: Internal Medicine

## 2017-05-13 ENCOUNTER — Encounter: Payer: Self-pay | Admitting: Internal Medicine

## 2017-05-13 VITALS — BP 159/77 | HR 82 | Temp 98.5°F | Ht 66.0 in | Wt 206.4 lb

## 2017-05-13 DIAGNOSIS — R8290 Unspecified abnormal findings in urine: Secondary | ICD-10-CM

## 2017-05-13 DIAGNOSIS — Z79899 Other long term (current) drug therapy: Secondary | ICD-10-CM

## 2017-05-13 DIAGNOSIS — E038 Other specified hypothyroidism: Secondary | ICD-10-CM

## 2017-05-13 DIAGNOSIS — E039 Hypothyroidism, unspecified: Secondary | ICD-10-CM

## 2017-05-13 DIAGNOSIS — R82998 Other abnormal findings in urine: Secondary | ICD-10-CM

## 2017-05-13 DIAGNOSIS — E781 Pure hyperglyceridemia: Secondary | ICD-10-CM

## 2017-05-13 DIAGNOSIS — Z23 Encounter for immunization: Secondary | ICD-10-CM

## 2017-05-13 DIAGNOSIS — I10 Essential (primary) hypertension: Secondary | ICD-10-CM

## 2017-05-13 DIAGNOSIS — Z87891 Personal history of nicotine dependence: Secondary | ICD-10-CM

## 2017-05-13 HISTORY — DX: Unspecified abnormal findings in urine: R82.90

## 2017-05-13 LAB — POCT URINALYSIS DIPSTICK
Bilirubin, UA: NEGATIVE
Glucose, UA: NEGATIVE
Ketones, UA: NEGATIVE
Leukocytes, UA: NEGATIVE
Nitrite, UA: NEGATIVE
RBC UA: NEGATIVE
SPEC GRAV UA: 1.025 (ref 1.010–1.025)
UROBILINOGEN UA: 0.2 U/dL
pH, UA: 5 (ref 5.0–8.0)

## 2017-05-13 MED ORDER — SIMVASTATIN 40 MG PO TABS: 40.0000 mg | ORAL_TABLET | Freq: Every day | ORAL | 2 refills | Status: DC

## 2017-05-13 MED ORDER — LOSARTAN POTASSIUM 100 MG PO TABS: 100.0000 mg | ORAL_TABLET | Freq: Every day | ORAL | 2 refills | Status: DC

## 2017-05-13 NOTE — Patient Instructions (Addendum)
Thank you for allowing [place] to care for you.  Your blood pressure today was 158/75, which is elevated. - We will be increasing your losartan to 100mg  Daily  We have provided you with a refill of your Simvastatin 40mg   Please begin taking your allopurinol as it will help to prevent further gout flairs.  We are getting a lab test to check your thyroid function. - You will be called if the result is abnormal and sent a letter if the result is normal  We are checking your urine due to the pink that you saw when wiping. - You will be called if there is an abnormal result.  Please return in 3 months for Blood pressure recheck and follow up.

## 2017-05-13 NOTE — Progress Notes (Signed)
   CC: Hypertension, Urine abnormality  HPI:  [name] is a 61 y.o. F with PMHx listed below presenting for Hypertension and Urine abnormality. Please see the A&P for the status of the patient's chronic medical problems.  Regarding her urine, she states that 1 month ago she experienced an episode were she saw pink on the toilet paper when she wiped. She stated that this only occurred once. She state that she felt some bladder pressure in the past month but just briefly and not with recurrence. She denies recent urinary frequency, dysuria, or odor with urination. She has not experienced a similar episode prior or since.   Past Medical History:  Diagnosis Date  . Anxiety   . Bartholin cyst 06/2003  . 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 Gout   . Hypertension   . 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 Normocytic anemia    Unclear etiology. Anemia panel in 03/2010 showing Iron 53 , TIBC 303 , iron saturation percentage 17, ferritin 197 , B12 and folate within normal limits.  . Postmenopausal status   . Tobacco abuse    Quit 2009, after approximately 15 pack year smoking history.  Marland Kitchen Umbilical hernia    S/P repair approximately 1995   Review of Systems:  Performed and negative except as otherwise indicated.   Physical Exam:  Vitals:   05/13/17 1408 05/13/17 1552  BP: (!) 158/75 (!) 159/77  Pulse: 82   Temp: 98.5 F (36.9 C)   TempSrc: Oral   SpO2: 100%   Weight: 206 lb 6.4 oz (93.6 kg)   Height: 5\' 6"  (1.676 m)    Physical Exam  Constitutional: She is oriented to person, place, and time. She appears well-developed and well-nourished.  HENT:  Head: Normocephalic and atraumatic.  Eyes: EOM are normal. Right eye exhibits no discharge. Left eye exhibits no discharge.  Cardiovascular: Normal rate, regular rhythm, normal heart sounds and intact distal pulses.   No murmur heard. Pulmonary/Chest: Effort normal  and breath sounds normal. No respiratory distress. She has no wheezes.  Abdominal: Soft. Bowel sounds are normal. She exhibits no distension. There is no tenderness.  Musculoskeletal: She exhibits no edema or deformity.  Neurological: She is alert and oriented to person, place, and time.  Skin: Skin is warm and dry.     Assessment & Plan:   See Encounters Tab for problem based charting.  Patient seen with Dr. Daryll Drown

## 2017-05-13 NOTE — Assessment & Plan Note (Addendum)
Patient denies any symptoms of hypothyroidism including cold intolerance, decreased energy, skin changes, or weight gain. Last TSH Normal on 03/2014. Last abnormal TSH 03/2013. Will obtain repeat TSH to evaluate for persistence or resolution of subclinical hypothyroidism. - TSH today  ADDENDUM: TSH elevated at 7.040, Will check Free T4 at follow up

## 2017-05-13 NOTE — Assessment & Plan Note (Signed)
At this time we'll not repeat blood work but we will continue her on statin therapy. She is currently taking atorvastatin 40 mg once daily. She requested a refill for this medication, which she was given. - Atorvastatin 40 mg once daily

## 2017-05-13 NOTE — Assessment & Plan Note (Addendum)
She states that 1 month ago she experienced an episode were she saw pink on the toilet paper when she wiped. She stated that this only occurred once. She state that she felt some bladder pressure in the past month but just briefly and not with recurrence. She denies recent urinary frequency, dysuria, or odor with urination. She has not experienced a similar episode prior or since.  - Will obtain urine dipstick / urinalysis   ADDENDUM: Urinalysis WNL

## 2017-05-13 NOTE — Assessment & Plan Note (Signed)
-   Flu vaccine adminstered

## 2017-05-13 NOTE — Assessment & Plan Note (Addendum)
Patinents Losartan increased from 75mg  to 100 mg once daily (11/2016). She states that she never received the prescription for the increased dose. Her BP today is elevated at 158/75. BMP 12/03/16 showed GFR > 90 and Cr 0.72. We will increase her dose to Losartan 100mg  daily as previously discussed and reevaluate in 3 months. - Increase losartan to 100mg , Daily - Return to clinic in 3 months

## 2017-05-14 LAB — URINALYSIS, COMPLETE
Bilirubin, UA: NEGATIVE
Glucose, UA: NEGATIVE
Ketones, UA: NEGATIVE
Leukocytes, UA: NEGATIVE
NITRITE UA: NEGATIVE
PH UA: 5 (ref 5.0–7.5)
RBC UA: NEGATIVE
Specific Gravity, UA: 1.019 (ref 1.005–1.030)
UUROB: 0.2 mg/dL (ref 0.2–1.0)

## 2017-05-14 LAB — MICROSCOPIC EXAMINATION
Bacteria, UA: NONE SEEN
CASTS: NONE SEEN /LPF

## 2017-05-14 LAB — TSH: TSH: 7.04 u[IU]/mL — AB (ref 0.450–4.500)

## 2017-05-14 NOTE — Progress Notes (Signed)
Internal Medicine Clinic Attending  I saw and evaluated the patient.  I personally confirmed the key portions of the history and exam documented by Dr. Melvin and I reviewed pertinent patient test results.  The assessment, diagnosis, and plan were formulated together and I agree with the documentation in the resident's note.  

## 2017-05-14 NOTE — Addendum Note (Signed)
Addended by: Gilles Chiquito B on: 05/14/2017 03:17 PM   Modules accepted: Level of Service

## 2017-05-17 ENCOUNTER — Telehealth: Payer: Self-pay | Admitting: *Deleted

## 2017-05-17 NOTE — Telephone Encounter (Signed)
Pt called / stated Losartan rx was not sent to [facility] (per [facility], it was sent to [facility]. Informed I will call rx to [facility] as requested - rx called to [facility].

## 2017-05-25 ENCOUNTER — Ambulatory Visit: Payer: Self-pay

## 2017-06-03 ENCOUNTER — Telehealth: Payer: Self-pay | Admitting: Internal Medicine

## 2017-06-03 ENCOUNTER — Ambulatory Visit
Admission: RE | Admit: 2017-06-03 | Discharge: 2017-06-03 | Disposition: A | Discharge status: Home / Self Care | Payer: No Typology Code available for payment source | Source: Ambulatory Visit | Attending: Internal Medicine | Admitting: Internal Medicine

## 2017-06-03 ENCOUNTER — Encounter: Payer: Self-pay | Admitting: Internal Medicine

## 2017-06-03 DIAGNOSIS — Z1231 Encounter for screening mammogram for malignant neoplasm of breast: Secondary | ICD-10-CM

## 2017-06-03 NOTE — Telephone Encounter (Signed)
Patient contacted with recent laboratory results of normal urinalysis and elevated TSH. Patient reassured that Her mildly elevated TSH does not put her at a high risk of an adverse event happening in the immediate future, but would need to be followed up on during her next visit, likely to check T4 levels.

## 2017-08-18 ENCOUNTER — Telehealth: Payer: Self-pay | Admitting: *Deleted

## 2017-08-18 DIAGNOSIS — I10 Essential (primary) hypertension: Secondary | ICD-10-CM

## 2017-08-18 DIAGNOSIS — E781 Pure hyperglyceridemia: Secondary | ICD-10-CM

## 2017-08-18 MED ORDER — LOSARTAN POTASSIUM 100 MG PO TABS: 100.0000 mg | ORAL_TABLET | Freq: Every day | ORAL | 1 refills | Status: DC

## 2017-08-18 NOTE — Telephone Encounter (Signed)
Losartan refill approved, Simvastatin not approved as patient should have 2 remaining refills based on last prescription

## 2017-08-19 NOTE — Telephone Encounter (Signed)
NEEDS REFILL, ON B/P MED

## 2017-08-19 NOTE — Telephone Encounter (Signed)
Called pt, she does not use [facility], was mad because simvastatin went to [facility], reviewed her pharmacy profile, [facility] is the only pharmacy now, called [facility] spoke w/ [facility], they will transfer script, gave [facility] losartan script verbally because it was printed not electronic

## 2017-10-06 ENCOUNTER — Encounter: Payer: Self-pay | Admitting: Internal Medicine

## 2017-10-06 ENCOUNTER — Ambulatory Visit (INDEPENDENT_AMBULATORY_CARE_PROVIDER_SITE_OTHER): Payer: Self-pay | Admitting: Internal Medicine

## 2017-10-06 ENCOUNTER — Telehealth: Payer: Self-pay

## 2017-10-06 VITALS — BP 148/70 | HR 75 | Temp 98.7°F | Wt 209.1 lb

## 2017-10-06 DIAGNOSIS — Z79899 Other long term (current) drug therapy: Secondary | ICD-10-CM

## 2017-10-06 DIAGNOSIS — M109 Gout, unspecified: Secondary | ICD-10-CM

## 2017-10-06 DIAGNOSIS — E038 Other specified hypothyroidism: Secondary | ICD-10-CM

## 2017-10-06 DIAGNOSIS — M79644 Pain in right finger(s): Secondary | ICD-10-CM | POA: Insufficient documentation

## 2017-10-06 DIAGNOSIS — R011 Cardiac murmur, unspecified: Secondary | ICD-10-CM

## 2017-10-06 DIAGNOSIS — I10 Essential (primary) hypertension: Secondary | ICD-10-CM

## 2017-10-06 DIAGNOSIS — E02 Subclinical iodine-deficiency hypothyroidism: Secondary | ICD-10-CM

## 2017-10-06 DIAGNOSIS — E039 Hypothyroidism, unspecified: Secondary | ICD-10-CM

## 2017-10-06 MED ORDER — HYDROCHLOROTHIAZIDE 25 MG PO TABS: 25.0000 mg | ORAL_TABLET | Freq: Every day | ORAL | 2 refills | Status: DC

## 2017-10-06 MED ORDER — ALLOPURINOL 300 MG PO TABS: 300.0000 mg | ORAL_TABLET | Freq: Every day | ORAL | 3 refills | Status: DC

## 2017-10-06 NOTE — Progress Notes (Signed)
   CC: Hypertension, Gout, Subclinical hypothyroidism, Right Thumb pain HPI:  [name] is a 62 y.o. F with PMHx listed below presenting for Hypertension, Gout, Subclinical hypothyroidism, Right Thumb pain. Please see the A&P for the status of the patient's chronic medical problems.  Patient state that she has been experiencing some mild achy pain and mildly limited range of motion and mild swelling in her right thumb every since she irritated it trying to open a very tight jar. She states the pain has improved since it started and her range of motion is improving as well.   Past Medical History:  Diagnosis Date  . Anxiety   . Bartholin cyst 06/2003  . 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 Gout   . Hypertension   . 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 Normocytic anemia    Unclear etiology. Anemia panel in 03/2010 showing Iron 53 , TIBC 303 , iron saturation percentage 17, ferritin 197 , B12 and folate within normal limits.  . Postmenopausal status   . Tobacco abuse    Quit 2009, after approximately 15 pack year smoking history.  Marland Kitchen Umbilical hernia    S/P repair approximately 1995   Review of Systems: Performed and all others negative.  Physical Exam:  Vitals:   10/06/17 1344 10/06/17 1413  BP: (!) 160/76 (!) 148/70  Pulse: 78 75  Temp: 98.7 F (37.1 C)   TempSrc: Oral   SpO2: 100%   Weight: 209 lb 1.6 oz (94.8 kg)    Physical Exam  Constitutional: She is oriented to person, place, and time. She appears well-developed and well-nourished.  Obese female  HENT:  Head: Normocephalic and atraumatic.  Eyes: EOM are normal. Right eye exhibits no discharge. Left eye exhibits no discharge.  Neck: No tracheal deviation present. No thyromegaly present.  Cardiovascular: Normal rate, regular rhythm and intact distal pulses.  Murmur heard. Pulmonary/Chest: Effort normal and breath sounds normal. No respiratory  distress.  Abdominal: Soft. Bowel sounds are normal.  Musculoskeletal: She exhibits no deformity.  Mild palmer swelling in the region of right MCP Mild tenderness to palpation of R MCP Mildly decrease passive and active range of motion of right MCP  No LE edema  Neurological: She is alert and oriented to person, place, and time.  Skin: Skin is warm and dry.   Assessment & Plan:   See Encounters Tab for problem based charting.  Patient discussed with Dr. Evette Doffing

## 2017-10-06 NOTE — Assessment & Plan Note (Signed)
BP 148/70 today. Her BP remains elevated despite increase from 75mg  to 100mg  of Losartan daily. We will be adding HCTZ to her regimen today and having her return in 1 month for recheck and BMP - Losartan 100mg  Daily - Hydrochlorothiazide 12.5mg  Daily - Diet and Exercise - Return in 1 Month for BP recheck and BMP

## 2017-10-06 NOTE — Assessment & Plan Note (Signed)
Patient state that she has been experiencing some mild achy pain and mildly limited range of motion and mild swelling in her right thumb every since she irritated it trying to open a very tight jar. She states the pain has improved since it started and her range of motion is improving as well.  On exam mildly restricted flexion and extension of the right fifth MCP with mild palmer surface swelling of the area and mild tenderness to joint palpation. No erythema noted. Suspect joint inflammation due to minor trauma when straining to open tight jar as described by patient. Will treat conservatively with NSAIDs and rest. Patient does not like a lot of medications; will have her use her home aleve. - Aleve 220mg  (OTC) BID, can in crease to TID - Rest right hand, especially grasping movements - Return precautions given

## 2017-10-06 NOTE — Assessment & Plan Note (Signed)
Patient has not had a gout flair in some time. She has only intermittently been taking her allopurinol. She was instructed to take this medication regularly to prevent gout flairs. She will bridge to allopurinol with aleve (which she will already be taking for her thumb pain). - Refill Allopurinol 300mg  Daily - Aleve 220mg  BID to TID for 1 week

## 2017-10-06 NOTE — Assessment & Plan Note (Addendum)
Patient with history of subclinical hypothyroidism. She continues to deny cold intolerance, decreased energy, or skin changes. Last TSH elevated at 7.040. Will recheck TSH today with Free T4. - TSH and Free T4 today  ADDENDUM TSH and free T4 normal at 4.37 and 1.1 respectively.

## 2017-10-06 NOTE — Patient Instructions (Addendum)
Thank you for allowing [place] to care for you  For your high blood pressure: - Your blood pressure was elevated today at 148/70 - We are starting Hydrochlorothiazide 12.5mg  Daily - Continue to take losartan - Return in 1 month for labs and blood pressure check  For your gout - We have refilled your allopurinol  For your elevated thyroid lab: - We are rechecking labs today  For your thumb pain: - Take aleve twice a day for 1 week - Limit your [place] of this hand - Return to clinic if your symptoms fail to improve or worsen over the next 2 weeks

## 2017-10-07 ENCOUNTER — Encounter: Payer: No Typology Code available for payment source | Admitting: Internal Medicine

## 2017-10-07 LAB — TSH: TSH: 4.37 u[IU]/mL (ref 0.450–4.500)

## 2017-10-07 LAB — T4, FREE: Free T4: 1.1 ng/dL (ref 0.82–1.77)

## 2017-10-07 NOTE — Progress Notes (Signed)
Internal Medicine Clinic Attending  Case discussed with Dr. Melvin  at the time of the visit.  We reviewed the resident's history and exam and pertinent patient test results.  I agree with the assessment, diagnosis, and plan of care documented in the resident's note.  

## 2017-10-11 ENCOUNTER — Encounter: Payer: Self-pay | Admitting: Internal Medicine

## 2017-10-11 NOTE — Progress Notes (Signed)
Patient sent letter with recent normal test results.  Jenna Grippe, DO IM PGY-1

## 2017-10-13 ENCOUNTER — Encounter: Payer: Self-pay | Admitting: *Deleted

## 2017-11-11 ENCOUNTER — Ambulatory Visit (HOSPITAL_COMMUNITY)
Admission: EM | Admit: 2017-11-11 | Discharge: 2017-11-11 | Disposition: A | Discharge status: Home / Self Care | Payer: No Typology Code available for payment source | Attending: Family Medicine | Admitting: Family Medicine

## 2017-11-11 ENCOUNTER — Encounter: Payer: No Typology Code available for payment source | Admitting: Internal Medicine

## 2017-11-11 ENCOUNTER — Encounter (HOSPITAL_COMMUNITY): Payer: Self-pay | Admitting: Family Medicine

## 2017-11-11 DIAGNOSIS — Z87891 Personal history of nicotine dependence: Secondary | ICD-10-CM | POA: Insufficient documentation

## 2017-11-11 DIAGNOSIS — M2550 Pain in unspecified joint: Secondary | ICD-10-CM

## 2017-11-11 DIAGNOSIS — M1000 Idiopathic gout, unspecified site: Secondary | ICD-10-CM | POA: Insufficient documentation

## 2017-11-11 DIAGNOSIS — M10042 Idiopathic gout, left hand: Secondary | ICD-10-CM

## 2017-11-11 DIAGNOSIS — Z79899 Other long term (current) drug therapy: Secondary | ICD-10-CM | POA: Insufficient documentation

## 2017-11-11 LAB — URIC ACID: Uric Acid, Serum: 7.9 mg/dL — ABNORMAL HIGH (ref 2.3–6.6)

## 2017-11-11 MED ORDER — DEXAMETHASONE SODIUM PHOSPHATE 10 MG/ML IJ SOLN
INTRAMUSCULAR | Status: AC
  Filled 2017-11-11: qty 1

## 2017-11-11 MED ORDER — HYDROCODONE-ACETAMINOPHEN 5-325 MG PO TABS: 1.0000 | ORAL_TABLET | Freq: Four times a day (QID) | ORAL | 0 refills | Status: DC | PRN

## 2017-11-11 MED ORDER — DEXAMETHASONE SODIUM PHOSPHATE 10 MG/ML IJ SOLN
10.0000 mg | Freq: Once | INTRAMUSCULAR | Status: AC
  Administered 2017-11-11: 10 mg via INTRAMUSCULAR

## 2017-11-11 NOTE — ED Triage Notes (Signed)
Pt here for joint pain. It has been in both hands and feet and now worse in the right hand and swelling.

## 2017-11-11 NOTE — ED Provider Notes (Signed)
[place]    CSN: [id number] Arrival date & time: 11/11/17  1600     History   Chief Complaint Chief Complaint  Patient presents with  . Joint Pain    HPI Jenna May is a 62 y.o. female.   62 year old female, with history of hypertension, anxiety, gout, anemia, presenting today complaining of joint pain.  Patient states that over the past 2 weeks, she has had multiple joints affected by pain and swelling.  States that she has had pain in her right hand, right foot and currently her left hand.  Has a prescription for allopurinol that she has not been taking.  States that she has had some redness, warmth and pain in her left hand for the past 2 days.  She has been using Aleve at home without much relief.  Denies any injury to her hand. No fever or chills    The history is provided by the patient.  Hand Pain  This is a new problem. The current episode started 2 days ago. The problem occurs constantly. The problem has not changed since onset.Pertinent negatives include no chest pain, no abdominal pain, no headaches and no shortness of breath. Nothing aggravates the symptoms. The symptoms are relieved by NSAIDs. Treatments tried: aleve  The treatment provided moderate relief.    Past Medical History:  Diagnosis Date  . Anxiety   . Bartholin cyst 06/2003  . 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 Gout   . Hypertension   . 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 Normocytic anemia    Unclear etiology. Anemia panel in 03/2010 showing Iron 53 , TIBC 303 , iron saturation percentage 17, ferritin 197 , B12 and folate within normal limits.  . Postmenopausal status   . Tobacco abuse    Quit 2009, after approximately 15 pack year smoking history.  Marland Kitchen Umbilical hernia    S/P repair approximately 1995    Patient Active Problem List   Diagnosis Date Noted  . Thumb pain, right 10/06/2017  . Need for immunization  against influenza 05/13/2017  . Urine abnormality 05/13/2017  . Hemorrhoids 12/03/2016  . Left knee pain 09/30/2015  . 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 Hypertriglyceridemia 01/27/2012  . Gout 01/21/2012  . Preventative health care 11/07/2010  . LIPOMA 08/19/2006  . ANEMIA, NORMOCYTIC 06/14/2006  . Essential hypertension 06/14/2006    Past Surgical History:  Procedure Laterality Date  . APPENDECTOMY  approximately 1991  . TOTAL ABDOMINAL HYSTERECTOMY  03/1996   2/2 fibroids  . UMBILICAL HERNIA REPAIR  approximately 1995    OB History   None      Home Medications    Prior to Admission medications   Medication Sig Start Date End Date Taking? Authorizing Provider  allopurinol (ZYLOPRIM) 300 MG tablet Take 1 tablet (300 mg total) by mouth daily. 10/06/17 10/06/18  [name], MD  fish oil-omega-3 fatty acids 1000 MG capsule Take 2 capsules (2 g total) by mouth daily. 07/12/12   Kalia-Reynolds, Maitri S, DO  hydrochlorothiazide (HYDRODIURIL) 25 MG tablet Take 1 tablet (25 mg total) by mouth daily. 10/06/17   [name], MD  HYDROcodone-acetaminophen (NORCO/VICODIN) 5-325 MG tablet Take 1 tablet by mouth every 6 (six) hours as needed. 11/11/17   Blue, Olivia C, PA-C  losartan (COZAAR) 100 MG tablet Take 1 tablet (100 mg total) by mouth daily. 08/18/17 09/17/17  [name], MD  Menthol-Methyl Salicylate (MUSCLE RUB) 10-15 %  CREA Apply 1 application topically as needed for muscle pain.    [provider]  Multiple Vitamin (MULTIVITAMIN WITH MINERALS) TABS tablet Take 1 tablet by mouth daily.    [provider]  naproxen (NAPROSYN) 375 MG tablet Take 1 tablet (375 mg total) by mouth 2 (two) times daily. 09/25/15   Margarita Mail, PA-C  simvastatin (ZOCOR) 40 MG tablet Take 1 tablet (40 mg total) by mouth daily. 05/13/17   [name], MD    Family History Family History  Problem Relation Age of Onset  . Hypertension Mother   . Diabetes  Father   . Hypertension Father   . Hypertension Sister   . Heart attack Brother 44  . Breast cancer Neg Hx     Social History Social History   Tobacco Use  . Smoking status: Former Smoker    Packs/day: 0.40    Years: 30.00    Pack years: 12.00    Types: Cigarettes    Last attempt to quit: 04/11/2008    Years since quitting: 9.5  . Smokeless tobacco: Never Used  Substance Use Topics  . Alcohol use: No    Alcohol/week: 0.0 oz  . Drug use: No     Allergies   Amlodipine   Review of Systems Review of Systems  Constitutional: Negative for chills and fever.  HENT: Negative for ear pain and sore throat.   Eyes: Negative for pain and visual disturbance.  Respiratory: Negative for cough and shortness of breath.   Cardiovascular: Negative for chest pain and palpitations.  Gastrointestinal: Negative for abdominal pain and vomiting.  Genitourinary: Negative for dysuria and hematuria.  Musculoskeletal: Positive for arthralgias and joint swelling. Negative for back pain.  Skin: Negative for color change and rash.  Neurological: Negative for seizures, syncope and headaches.  All other systems reviewed and are negative.    Physical Exam Triage Vital Signs ED Triage Vitals  Enc Vitals Group     BP 11/11/17 1640 (!) 172/84     Pulse Rate 11/11/17 1640 99     Resp 11/11/17 1640 18     Temp 11/11/17 1640 98.6 F (37 C)     Temp src --      SpO2 11/11/17 1640 100 %     Weight --      Height --      Head Circumference --      Peak Flow --      Pain Score 11/11/17 1636 10     Pain Loc --      Pain Edu? --      Excl. in [place]? --    No data found.  Updated Vital Signs BP (!) 172/84   Pulse 99   Temp 98.6 F (37 C)   Resp 18   LMP 09/29/1995   SpO2 100%   Visual Acuity Right Eye Distance:   Left Eye Distance:   Bilateral Distance:    Right Eye Near:   Left Eye Near:    Bilateral Near:     Physical Exam  Constitutional: She appears well-developed and  well-nourished. No distress.  HENT:  Head: Normocephalic and atraumatic.  Eyes: Conjunctivae are normal.  Neck: Neck supple.  Cardiovascular: Normal rate and regular rhythm.  No murmur heard. Pulmonary/Chest: Effort normal and breath sounds normal. No respiratory distress.  Abdominal: Soft. There is no tenderness.  Musculoskeletal: She exhibits no edema.       Left hand: She exhibits tenderness and swelling.  Mild erythema, edema  and warmth to the left hand. Normal ROM of the affected joint without evidence of septic joint  Neurological: She is alert.  Skin: Skin is warm and dry.  Psychiatric: She has a normal mood and affect.  Nursing note and vitals reviewed.    UC Treatments / Results  Labs (all labs ordered are listed, but only abnormal results are displayed) Labs Reviewed  URIC ACID    EKG None Radiology No results found.  Procedures Procedures (including critical care time)  Medications Ordered in UC Medications  dexamethasone (DECADRON) injection 10 mg (has no administration in time range)     Initial Impression / Assessment and Plan / UC Course  I have reviewed the triage vital signs and the nursing notes.  Pertinent labs & imaging results that were available during my care of the patient were reviewed by me and considered in my medical decision making (see chart for details).     Left hand pain, redness and swelling  Will treat for gout with IM decadron and small amount of pain meds  Recommended follow-up with PCP at her upcoming scheduled appointment   Final Clinical Impressions(s) / UC Diagnoses   Final diagnoses:  Acute idiopathic gout of left hand    ED Discharge Orders        Ordered    HYDROcodone-acetaminophen (NORCO/VICODIN) 5-325 MG tablet  Every 6 hours PRN     11/11/17 1649       Controlled Substance Prescriptions [place] Controlled Substance Registry consulted? Not Applicable   Phebe Colla, [place] 11/11/17 1656

## 2017-11-15 ENCOUNTER — Telehealth: Payer: Self-pay | Admitting: Internal Medicine

## 2017-11-15 NOTE — Telephone Encounter (Signed)
Patient went to the Urgent Care over the 04/18, the medicine she have is very strong. She is feeling light headed, she was feeling that way the reason she went to UR and her blood pressure was up

## 2017-11-16 NOTE — Telephone Encounter (Signed)
Returned call to patient. States she's feeling "a lot better" today. Has been taking BP meds as well as "home remedies (garlic, vinegar and tea)." Today's BP 141/88. Has stopped hydrocodone 2/2 chills, H/A, dizziness. Hand pain is relieved with one aleve daily. Confirmed appt with PCP for this Thursday, 11/18/2017 at 1:45 PM. Hubbard Hartshorn, RN, BSN

## 2017-11-18 ENCOUNTER — Ambulatory Visit (INDEPENDENT_AMBULATORY_CARE_PROVIDER_SITE_OTHER): Payer: Self-pay | Admitting: Internal Medicine

## 2017-11-18 ENCOUNTER — Encounter: Payer: Self-pay | Admitting: Internal Medicine

## 2017-11-18 ENCOUNTER — Other Ambulatory Visit: Payer: Self-pay

## 2017-11-18 VITALS — BP 134/72 | HR 77 | Temp 98.3°F | Ht 66.0 in | Wt 198.4 lb

## 2017-11-18 DIAGNOSIS — R011 Cardiac murmur, unspecified: Secondary | ICD-10-CM

## 2017-11-18 DIAGNOSIS — M1A09X0 Idiopathic chronic gout, multiple sites, without tophus (tophi): Secondary | ICD-10-CM

## 2017-11-18 DIAGNOSIS — M109 Gout, unspecified: Secondary | ICD-10-CM

## 2017-11-18 DIAGNOSIS — I10 Essential (primary) hypertension: Secondary | ICD-10-CM

## 2017-11-18 DIAGNOSIS — Z79899 Other long term (current) drug therapy: Secondary | ICD-10-CM

## 2017-11-18 NOTE — Assessment & Plan Note (Signed)
BP today 134/72, Improved from previous of 148/70. Patient states she has been taking her medications without side effects and has also been working on weight loss (acording to our scale she has lost about 11lbs). She was congratulated on her efforts and progress. She states she has also been drinking tea, taking garlic, and taking apple cider vinegar; which she believes is helping as well. - Losartan 100mg  Daily - HCTZ 12.5mg  Daily - Continue Lifestyle Modifications

## 2017-11-18 NOTE — Progress Notes (Signed)
   CC: Hypertension, Gout  HPI:  [name] is a 62 y.o. F with PMHx listed below presenting for Hypertension, Gout. Please see the A&P for the status of the patient's chronic medical problems.   Past Medical History:  Diagnosis Date  . Anxiety   . Bartholin cyst 06/2003  . 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 Gout   . Hypertension   . 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 Normocytic anemia    Unclear etiology. Anemia panel in 03/2010 showing Iron 53 , TIBC 303 , iron saturation percentage 17, ferritin 197 , B12 and folate within normal limits.  . Postmenopausal status   . Tobacco abuse    Quit 2009, after approximately 15 pack year smoking history.  Marland Kitchen Umbilical hernia    S/P repair approximately 1995   Review of Systems: Performed and all others negative.  Physical Exam:  Vitals:   11/18/17 1320  BP: 134/72  Pulse: 77  Temp: 98.3 F (36.8 C)  TempSrc: Oral  SpO2: 100%  Weight: 198 lb 6.4 oz (90 kg)  Height: 5\' 6"  (1.676 m)   Physical Exam  Constitutional: She appears well-developed and well-nourished.  Cardiovascular: Normal rate, regular rhythm and intact distal pulses.  Murmur heard. Systolic murmur  Pulmonary/Chest: Effort normal and breath sounds normal. No respiratory distress.  Abdominal: Soft. Bowel sounds are normal. She exhibits no distension. There is no tenderness.  Musculoskeletal: She exhibits no edema, tenderness or deformity.  Skin: Skin is warm and dry.   Assessment & Plan:   See Encounters Tab for problem based charting.  Patient discussed with Dr. Beryle Beams

## 2017-11-18 NOTE — Patient Instructions (Addendum)
Thank you for allowing [place] to care for you  For your high blood pressure: - BP 134/72 today, good job! - Continue to take Losartan and HCTZ as prescribed  For your gout - Take allopurinol everyday to prevent flairs  Please follow up in about 6 months

## 2017-11-18 NOTE — Assessment & Plan Note (Addendum)
Paitent was recently seen for hand pain believed to be due to a gout flair at urgent care. She was treated with decadron and given oxycodone for pain control. She state her pain and swelling has resolved while taking naproxen, but she stopped taking the hydrocodone as it made her feel lightheaded and unsteady. She asks that we dispose of the hydrocodone. 9 Tablet of hydrocodone-acetamenophen 5-325 were turned over to pharmacy to be crushed and disposed of. She was counseled that she needed to restart daily allopurinol (which she has been taking inconsitently) to help prevent future gout flairs since this one had resolved. She was advised that should be sure to take allopurinol every day. - Continue Allopurinol 300mg  Daily

## 2017-11-21 NOTE — Progress Notes (Signed)
Medicine attending: Medical history, presenting problems, physical findings, and medications, reviewed with resident physician Dr [name] on the day of the patient visit and I concur with his evaluation and management plan.

## 2017-12-07 ENCOUNTER — Ambulatory Visit: Payer: Self-pay

## 2018-02-08 ENCOUNTER — Other Ambulatory Visit: Payer: Self-pay | Admitting: *Deleted

## 2018-02-08 ENCOUNTER — Other Ambulatory Visit: Payer: Self-pay | Admitting: Internal Medicine

## 2018-02-08 DIAGNOSIS — I10 Essential (primary) hypertension: Secondary | ICD-10-CM

## 2018-02-08 MED ORDER — HYDROCHLOROTHIAZIDE 25 MG PO TABS: 25.0000 mg | ORAL_TABLET | Freq: Every day | ORAL | 2 refills | Status: DC

## 2018-02-08 NOTE — Telephone Encounter (Signed)
Next appt scheduled  03/17/18 with PCP.

## 2018-02-14 ENCOUNTER — Other Ambulatory Visit: Payer: Self-pay | Admitting: *Deleted

## 2018-02-14 DIAGNOSIS — I10 Essential (primary) hypertension: Secondary | ICD-10-CM

## 2018-02-14 MED ORDER — LOSARTAN POTASSIUM 100 MG PO TABS: 100.0000 mg | ORAL_TABLET | Freq: Every day | ORAL | 1 refills | Status: DC

## 2018-02-14 NOTE — Telephone Encounter (Signed)
Refill for losartan approved

## 2018-02-15 NOTE — Telephone Encounter (Signed)
Rx phoned into pharmacy.[facility] [place]7/23/20198:53 AM

## 2018-03-16 NOTE — Progress Notes (Signed)
   CC: Hypertension, Gout, Preventative Healthcare, Vision Disturbance  HPI:  [name] is a 62 y.o. F with PMHx listed below presenting for Hypertension, Gout, Preventative Healthcare, Vision disturbance. Please see the A&P for the status of the patient's chronic medical problems.   Past Medical History:  Diagnosis Date  . Anxiety   . Bartholin cyst 06/2003  . 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 Gout   . Hypertension   . 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 Normocytic anemia    Unclear etiology. Anemia panel in 03/2010 showing Iron 53 , TIBC 303 , iron saturation percentage 17, ferritin 197 , B12 and folate within normal limits.  . Postmenopausal status   . Tobacco abuse    Quit 2009, after approximately 15 pack year smoking history.  Marland Kitchen Umbilical hernia    S/P repair approximately 1995   Review of Systems:  Performed and all others negative.  Physical Exam:  Vitals:   03/17/18 1333 03/17/18 1357  BP: (!) 154/65 (!) 142/72  Pulse: 73 71  Temp: 98.5 F (36.9 C)   TempSrc: Oral   SpO2: 100% 100%  Weight: 203 lb 6.4 oz (92.3 kg)   Height: 5\' 6"  (1.676 m)    Physical Exam  Constitutional: She appears well-developed and well-nourished. No distress.  Cardiovascular: Normal rate and regular rhythm.  Murmur heard. Pulmonary/Chest: Effort normal and breath sounds normal. No respiratory distress.  Abdominal: Soft. Bowel sounds are normal. She exhibits no distension. There is no tenderness.  Musculoskeletal: She exhibits no edema or deformity.  Skin: Skin is warm and dry.    Assessment & Plan:   See Encounters Tab for problem based charting.  Patient discussed with Dr. Rebeca Alert

## 2018-03-17 ENCOUNTER — Ambulatory Visit (INDEPENDENT_AMBULATORY_CARE_PROVIDER_SITE_OTHER): Payer: Self-pay | Admitting: Internal Medicine

## 2018-03-17 ENCOUNTER — Other Ambulatory Visit: Payer: Self-pay

## 2018-03-17 ENCOUNTER — Encounter: Payer: Self-pay | Admitting: Internal Medicine

## 2018-03-17 VITALS — BP 142/72 | HR 71 | Temp 98.5°F | Ht 66.0 in | Wt 203.4 lb

## 2018-03-17 DIAGNOSIS — M1A09X0 Idiopathic chronic gout, multiple sites, without tophus (tophi): Secondary | ICD-10-CM

## 2018-03-17 DIAGNOSIS — H5310 Unspecified subjective visual disturbances: Secondary | ICD-10-CM

## 2018-03-17 DIAGNOSIS — H5319 Other subjective visual disturbances: Secondary | ICD-10-CM

## 2018-03-17 DIAGNOSIS — I10 Essential (primary) hypertension: Secondary | ICD-10-CM

## 2018-03-17 DIAGNOSIS — Z23 Encounter for immunization: Secondary | ICD-10-CM

## 2018-03-17 DIAGNOSIS — Z79899 Other long term (current) drug therapy: Secondary | ICD-10-CM

## 2018-03-17 DIAGNOSIS — R011 Cardiac murmur, unspecified: Secondary | ICD-10-CM

## 2018-03-17 DIAGNOSIS — M109 Gout, unspecified: Secondary | ICD-10-CM

## 2018-03-17 HISTORY — DX: Unspecified subjective visual disturbances: H53.10

## 2018-03-17 NOTE — Assessment & Plan Note (Signed)
Flu vaccine administered

## 2018-03-17 NOTE — Assessment & Plan Note (Addendum)
Patient denies further gout flairs since her last ED visit. She states she stopped taking her allopurinol consistently as it made her feel funny, but she is unable to describe exactly how it made her feel. She was encouraged to take her medication everyday and we can access any side effect she may be having at future visits. - Allopurinol 300mg  Daily

## 2018-03-17 NOTE — Assessment & Plan Note (Signed)
Patient describes 1-2 months of dark floating vision disturbance. She states she has noticed this once in the past and it lasted for about 1 month at that time. She states that the change comes and goes and is not associated with any permanent vision changes, changes in acuity of vision, nor pain. She was reassured that these are likely simple floaters that occur over time due to normal wear to the eye. She was informed that we would continued to monitor these changes and if any concerning symptoms arise she should let us know. - Continue to monitor

## 2018-03-17 NOTE — Assessment & Plan Note (Addendum)
BP today 154/65 and 142/72 on recheck, this is elevated compared to her previous of 134/72. Patient has been taking her medications without side effects and has also been working on weight loss, she has unfortunately gained a few pounds since her last visit, but was encouraged to keep working at it. We also discussed that she will need medication adjustment if her blood pressure remains elevated at her next visit. - Losartan 100mg  Daily - HCTZ 12.5mg  Daily - Continue Lifestyle Modifications

## 2018-03-17 NOTE — Patient Instructions (Signed)
Thank you for allowing [place] to care for you  For you high blood pressure: - BP elevated today - Continue current medications - If BP elevated at next visit, we will discuss medication changes - Continue to work on diet and exercise  For your gout - Please take you allopurinol everyday to prevent gout attacks  Four your vision changes: - What you are describing is consistent with normal "floaters" - We will monitor this to see if other symptoms develop  You received your flu vaccine today  Please follow up in about 6 months

## 2018-03-18 NOTE — Progress Notes (Signed)
Internal Medicine Clinic Attending  Case discussed with Dr. Melvin at the time of the visit.  We reviewed the resident's history and exam and pertinent patient test results.  I agree with the assessment, diagnosis, and plan of care documented in the resident's note.  Alexander Raines, M.D., Ph.D.  

## 2018-05-09 ENCOUNTER — Other Ambulatory Visit: Payer: Self-pay | Admitting: Internal Medicine

## 2018-05-09 DIAGNOSIS — Z1231 Encounter for screening mammogram for malignant neoplasm of breast: Secondary | ICD-10-CM

## 2018-05-10 ENCOUNTER — Other Ambulatory Visit: Payer: Self-pay

## 2018-05-10 DIAGNOSIS — I10 Essential (primary) hypertension: Secondary | ICD-10-CM

## 2018-05-10 MED ORDER — HYDROCHLOROTHIAZIDE 25 MG PO TABS: 25.0000 mg | ORAL_TABLET | Freq: Every day | ORAL | 5 refills | Status: DC

## 2018-05-10 NOTE — Telephone Encounter (Signed)
Refill Approved

## 2018-05-10 NOTE — Telephone Encounter (Signed)
hydrochlorothiazide (HYDRODIURIL) 25 MG tablet, refill request @  [place], [place] - [place] [phone] (Phone) [phone] (Fax)

## 2018-05-18 ENCOUNTER — Other Ambulatory Visit: Payer: Self-pay | Admitting: Internal Medicine

## 2018-05-18 DIAGNOSIS — E781 Pure hyperglyceridemia: Secondary | ICD-10-CM

## 2018-05-18 MED ORDER — SIMVASTATIN 40 MG PO TABS: 40.0000 mg | ORAL_TABLET | Freq: Every day | ORAL | 2 refills | Status: DC

## 2018-05-18 NOTE — Telephone Encounter (Signed)
Refill approved.

## 2018-05-18 NOTE — Telephone Encounter (Signed)
Refill Request  simvastatin (ZOCOR) 40 MG tablet  Pt states she needs this called in to the  [facility] department.

## 2018-05-27 ENCOUNTER — Telehealth (HOSPITAL_COMMUNITY): Payer: Self-pay

## 2018-05-27 NOTE — Telephone Encounter (Signed)
Left a message to call [facility] °

## 2018-06-13 ENCOUNTER — Ambulatory Visit: Payer: Self-pay

## 2018-06-13 ENCOUNTER — Encounter (INDEPENDENT_AMBULATORY_CARE_PROVIDER_SITE_OTHER): Payer: Self-pay

## 2018-06-17 ENCOUNTER — Other Ambulatory Visit (HOSPITAL_COMMUNITY): Payer: Self-pay | Admitting: *Deleted

## 2018-06-17 DIAGNOSIS — Z1231 Encounter for screening mammogram for malignant neoplasm of breast: Secondary | ICD-10-CM

## 2018-07-14 ENCOUNTER — Telehealth: Payer: Self-pay | Admitting: Internal Medicine

## 2018-08-08 ENCOUNTER — Other Ambulatory Visit: Payer: Self-pay

## 2018-08-08 ENCOUNTER — Encounter: Payer: Self-pay | Admitting: Internal Medicine

## 2018-08-08 ENCOUNTER — Encounter (INDEPENDENT_AMBULATORY_CARE_PROVIDER_SITE_OTHER): Payer: Self-pay

## 2018-08-08 ENCOUNTER — Ambulatory Visit (INDEPENDENT_AMBULATORY_CARE_PROVIDER_SITE_OTHER): Payer: Self-pay | Admitting: Internal Medicine

## 2018-08-08 DIAGNOSIS — J310 Chronic rhinitis: Secondary | ICD-10-CM

## 2018-08-08 DIAGNOSIS — R011 Cardiac murmur, unspecified: Secondary | ICD-10-CM | POA: Insufficient documentation

## 2018-08-08 DIAGNOSIS — J329 Chronic sinusitis, unspecified: Secondary | ICD-10-CM

## 2018-08-08 HISTORY — DX: Chronic sinusitis, unspecified: J32.9

## 2018-08-08 MED ORDER — FLUTICASONE PROPIONATE 50 MCG/ACT NA SUSP: 1.0000 | Freq: Every day | NASAL | 0 refills | Status: DC

## 2018-08-08 NOTE — Assessment & Plan Note (Addendum)
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 No extension to neck. It is a chronic finding and has Echo on chart at 2017 when initially found to have the murmur. It showed trivial regurgitation of pulmonic valve.   Patient denies any chest pain, SOB, DOE. No evidence of volume overload on exam.   -NTD at this time. Will continue to f/u with PCP

## 2018-08-08 NOTE — Patient Instructions (Addendum)
Thank you for allowing [place] to provide your care today. You came here due to cold symptoms. Your symptoms are mild and your lung and throat exam is normal and reassuring. Post cold cough can take few weeks to resolved.  I prescribe Flonase spray for you that can help with runny nose. You can take the over the counter medications with Chlorpheniramine and Dextromethorphan (Please avoid Mucinex D as it can increased blood pressure and heart rate).  If your symptoms worsens or continue having pink or red mucus or developed dark red mucus or clot, please make sure to return to clinic for work up. We also discussed your old heart murmur today. You have been asymptomatic and your heart ultrasound that performed for that at 2017 was reassuring. It doen not need further work up today but please keep follow up with PCP.  Should you have any questions or concerns please call the internal medicine clinic at [phone].   Thank you

## 2018-08-08 NOTE — Assessment & Plan Note (Addendum)
Mild Rhinosinositis.  Presented with morning cough and mucus for past 2 weeks. Had cold symptoms, and sever runny nose 2 weeks ago that got better but still has the cough and mucus with it in the morning. No fever. No body ache or fatigue. No sore throat.   She overally feels well except having the morning cough and mucus with it, sometime associated with small amount of blood with the mucus after sever cough. She endorses that it is not a continues problem, is scant amount, without any clot, and just happens after sever cough and then get clear. She feels great during the day.   On exam:  Patient has been afebrile. Is tachycardiac today at 110-->104-->98 Nasal mucosa is normal. Pharnyx is clear without exudate. No current post nasal drainage. Lung is CTA.   Her symptoms is likely due to mild non allergic, non bacterial rhino sinusitis, (likely viral) and post URI cough.  -Recommended Coricidin, and flonase spray for symptoms management.  -Avoid Mucinex D or sudafed having Hx of HTN and mild tachycardia today. -Return to clinic if having more pink/red mucus with cough or if developed dark blood or clot sputum.

## 2018-08-08 NOTE — Progress Notes (Signed)
   CC: Cough and cold symptoms  HPI:  Jenna May is a 63 y.o. female with PMHx listed below, is here in clinic today due to cold symptoms.  Please refer to problem based charting for further details and assessment and plan.  Past Medical History:  Diagnosis Date  . Anxiety   . Bartholin cyst 06/2003  . 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 Gout   . Hypertension   . 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 Normocytic anemia    Unclear etiology. Anemia panel in 03/2010 showing Iron 53 , TIBC 303 , iron saturation percentage 17, ferritin 197 , B12 and folate within normal limits.  . Postmenopausal status   . Tobacco abuse    Quit 2009, after approximately 15 pack year smoking history.  Marland Kitchen Umbilical hernia    S/P repair approximately 1995   Family Hx: HTN      Mother HTN      Father DM        Father  Social Hx: Does not smoke (And is not a passive smoker) Drinks a glass of wine at night, 4-5 times a week No illicit drug use   Review of Systems:  Review of Systems  Constitutional: Positive for chills. Negative for fever and malaise/fatigue.  HENT: Positive for congestion. Negative for ear discharge, ear pain, sinus pain and sore throat.   Respiratory: Positive for cough. Negative for shortness of breath.   Gastrointestinal: Negative for abdominal pain, nausea and vomiting.  Genitourinary: Negative for dysuria, frequency, hematuria and urgency.  Musculoskeletal: Negative for myalgias.  Skin: Negative for rash.  Neurological: Negative for dizziness, weakness and headaches.    Physical Exam:  Vitals:   08/08/18 0848  BP: 139/73  Pulse: (!) 110  Temp: 98.8 F (37.1 C)  TempSrc: Oral  SpO2: 100%  Weight: 199 lb 6.4 oz (90.4 kg)  Height: 5\' 6"  (1.676 m)   Physical Exam Vitals signs reviewed.  Constitutional:      Appearance: Normal appearance.  HENT:     Head: Normocephalic and atraumatic.   Nose: Nose normal.     Mouth/Throat:     Pharynx: Oropharynx is clear. No oropharyngeal exudate.  Eyes:     Extraocular Movements: Extraocular movements intact.  Cardiovascular:     Rate and Rhythm: Tachycardia present.     Heart sounds: Murmur present.  Pulmonary:     Effort: Pulmonary effort is normal.     Breath sounds: Normal breath sounds. No wheezing or rales.  Abdominal:     General: Bowel sounds are normal.     Palpations: Abdomen is soft.     Tenderness: There is no abdominal tenderness.  Musculoskeletal:     Right lower leg: No edema.     Left lower leg: No edema.  Skin:    General: Skin is warm and dry.  Neurological:     Mental Status: She is alert and oriented to person, place, and time.      Assessment & Plan:   See Encounters Tab for problem based charting.  Patient discussed with Dr. Lynnae January

## 2018-08-10 NOTE — Progress Notes (Signed)
Internal Medicine Clinic Attending  Case discussed with Dr. Masoudi  at the time of the visit.  We reviewed the resident's history and exam and pertinent patient test results.  I agree with the assessment, diagnosis, and plan of care documented in the resident's note.  

## 2018-08-16 ENCOUNTER — Other Ambulatory Visit: Payer: Self-pay | Admitting: Internal Medicine

## 2018-08-16 DIAGNOSIS — I10 Essential (primary) hypertension: Secondary | ICD-10-CM

## 2018-08-16 NOTE — Telephone Encounter (Signed)
Refill approved.

## 2018-08-18 ENCOUNTER — Telehealth: Payer: Self-pay | Admitting: Internal Medicine

## 2018-09-14 ENCOUNTER — Other Ambulatory Visit: Payer: Self-pay | Admitting: Internal Medicine

## 2018-09-14 DIAGNOSIS — I10 Essential (primary) hypertension: Secondary | ICD-10-CM

## 2018-09-15 ENCOUNTER — Ambulatory Visit
Admission: RE | Admit: 2018-09-15 | Discharge: 2018-09-15 | Disposition: A | Discharge status: Home / Self Care | Payer: No Typology Code available for payment source | Source: Ambulatory Visit | Attending: Obstetrics and Gynecology | Admitting: Obstetrics and Gynecology

## 2018-09-15 ENCOUNTER — Ambulatory Visit (HOSPITAL_COMMUNITY)
Admission: RE | Admit: 2018-09-15 | Discharge: 2018-09-15 | Disposition: A | Discharge status: Home / Self Care | Payer: Self-pay | Source: Ambulatory Visit | Attending: Obstetrics and Gynecology | Admitting: Obstetrics and Gynecology

## 2018-09-15 DIAGNOSIS — Z1231 Encounter for screening mammogram for malignant neoplasm of breast: Secondary | ICD-10-CM

## 2018-09-15 NOTE — Telephone Encounter (Signed)
Refill approved.

## 2018-09-16 ENCOUNTER — Other Ambulatory Visit (HOSPITAL_COMMUNITY): Payer: Self-pay | Admitting: *Deleted

## 2018-09-16 DIAGNOSIS — R928 Other abnormal and inconclusive findings on diagnostic imaging of breast: Secondary | ICD-10-CM

## 2018-09-19 ENCOUNTER — Encounter: Payer: Self-pay | Admitting: *Deleted

## 2018-10-06 ENCOUNTER — Encounter: Payer: Self-pay | Admitting: Internal Medicine

## 2018-10-06 ENCOUNTER — Other Ambulatory Visit: Payer: No Typology Code available for payment source

## 2018-10-11 ENCOUNTER — Ambulatory Visit (HOSPITAL_COMMUNITY)
Admission: RE | Admit: 2018-10-11 | Discharge: 2018-10-11 | Disposition: A | Discharge status: Home / Self Care | Payer: No Typology Code available for payment source | Source: Ambulatory Visit | Attending: Obstetrics and Gynecology | Admitting: Obstetrics and Gynecology

## 2018-10-11 ENCOUNTER — Ambulatory Visit: Admission: RE | Admit: 2018-10-11 | Payer: Self-pay | Source: Ambulatory Visit

## 2018-10-11 ENCOUNTER — Encounter (HOSPITAL_COMMUNITY): Payer: Self-pay

## 2018-10-11 ENCOUNTER — Ambulatory Visit
Admission: RE | Admit: 2018-10-11 | Discharge: 2018-10-11 | Disposition: A | Discharge status: Home / Self Care | Payer: No Typology Code available for payment source | Source: Ambulatory Visit | Attending: Obstetrics and Gynecology | Admitting: Obstetrics and Gynecology

## 2018-10-11 ENCOUNTER — Other Ambulatory Visit: Payer: Self-pay

## 2018-10-11 VITALS — BP 132/84 | Wt 199.0 lb

## 2018-10-11 DIAGNOSIS — Z1239 Encounter for other screening for malignant neoplasm of breast: Secondary | ICD-10-CM

## 2018-10-11 DIAGNOSIS — R928 Other abnormal and inconclusive findings on diagnostic imaging of breast: Secondary | ICD-10-CM

## 2018-10-11 NOTE — Patient Instructions (Signed)
Explained breast self awareness with Alletta Koogler. Patient did not need a Pap smear today due to last Pap smear and HPV typing was 12/03/2016 and patient has a history of a hysterectomy for benign reasons. Let patient know that she doesn't need any further Pap smears due to her history of a hysterectomy for benign reasons. Referred patient to the [facility] for a left breast diagnostic mammogram and possible ultrasound per recommendation. Appointment scheduled for Tuesday, October 11, 2018 at 1130. Patient aware of appointment and will be there. Kayia Frick verbalized understanding.  Rickardo Brinegar, Arvil Chaco, RN 8:58 AM

## 2018-10-11 NOTE — Progress Notes (Signed)
Patient referred to [facility] by the [facility] due to recommending additional imaging of the left breast. Screening mammogram completed 09/15/2018.  Pap Smear: Pap smear not completed today. Last Pap smear was 12/03/2016 at [facility] and normal with negative HPV. Patient has a history of a hysterectomy in 1997 due to fibroids. Patient doesn't need any further Pap smears due to her history of a hysterectomy for benign reasons per BCCCP and ACOG guidelines. Per patient has no history of an abnormal Pap smear. Last Pap smear result is in Epic.  Physical exam: Breasts Breasts symmetrical. No skin abnormalities bilateral breasts. No nipple retraction bilateral breasts. No nipple discharge bilateral breasts. No lymphadenopathy. No lumps palpated bilateral breasts. No complaints of pain or tenderness on exam. Referred patient to the [facility] for a left breast diagnostic mammogram and possible ultrasound per recommendation. Appointment scheduled for Tuesday, October 11, 2018 at 1130.        Pelvic/Bimanual No Pap smear completed today since last Pap smear and HPV typing was 12/03/2016 and patient has a history of a hysterectomy for benign reasons. Pap smear not indicated per BCCCP guidelines.   Smoking History: Patient is a former smoker that quit 04/11/2008.  Patient Navigation: Patient education provided. Access to services provided for patient through [facility] program.   Colorectal Cancer Screening: Patient had a colonoscopy completed 05/28/2009. No complaints today. FIT Test given to patient to complete and return to BCCCP.  Breast and Cervical Cancer Risk Assessment: Patient has no family history of breast cancer, known genetic mutations, or radiation treatment to the chest before age 54. Patient has no history of cervical dysplasia, immunocompromised, or DES exposure in-utero.  Risk Assessment    Risk Scores      10/11/2018   Last edited by:  Armond Hang, LPN   5-year risk: 1.4 %   Lifetime risk: 6.9 %

## 2018-10-14 ENCOUNTER — Other Ambulatory Visit: Payer: Self-pay

## 2018-10-21 LAB — FECAL OCCULT BLOOD, IMMUNOCHEMICAL: Fecal Occult Bld: NEGATIVE

## 2018-11-01 ENCOUNTER — Encounter (HOSPITAL_COMMUNITY): Payer: Self-pay | Admitting: *Deleted

## 2018-11-03 ENCOUNTER — Encounter: Payer: Self-pay | Admitting: Internal Medicine

## 2018-11-03 ENCOUNTER — Ambulatory Visit (INDEPENDENT_AMBULATORY_CARE_PROVIDER_SITE_OTHER): Payer: Self-pay | Admitting: Internal Medicine

## 2018-11-03 ENCOUNTER — Encounter (HOSPITAL_COMMUNITY): Payer: Self-pay

## 2018-11-03 ENCOUNTER — Other Ambulatory Visit: Payer: Self-pay

## 2018-11-03 DIAGNOSIS — L03031 Cellulitis of right toe: Secondary | ICD-10-CM | POA: Insufficient documentation

## 2018-11-03 DIAGNOSIS — E781 Pure hyperglyceridemia: Secondary | ICD-10-CM

## 2018-11-03 DIAGNOSIS — H5310 Unspecified subjective visual disturbances: Secondary | ICD-10-CM

## 2018-11-03 DIAGNOSIS — I10 Essential (primary) hypertension: Secondary | ICD-10-CM

## 2018-11-03 HISTORY — DX: Cellulitis of right toe: L03.031

## 2018-11-03 MED ORDER — SIMVASTATIN 40 MG PO TABS: 40.0000 mg | ORAL_TABLET | Freq: Every day | ORAL | 2 refills | Status: DC

## 2018-11-03 MED ORDER — HYDROCHLOROTHIAZIDE 25 MG PO TABS: 25.0000 mg | ORAL_TABLET | Freq: Every day | ORAL | 5 refills | Status: DC

## 2018-11-03 MED ORDER — LOSARTAN POTASSIUM 100 MG PO TABS: 100.0000 mg | ORAL_TABLET | Freq: Every day | ORAL | 2 refills | Status: DC

## 2018-11-03 NOTE — Patient Instructions (Signed)
Verbal instructions given for tele-health visit 

## 2018-11-03 NOTE — Progress Notes (Signed)
Mailed FIT Test result letter to patient on 11/03/2018. Negative FIT Test result.

## 2018-11-03 NOTE — Assessment & Plan Note (Signed)
During out phone visit patient stated she has had a "hang nail" on her right foot for about 2 weeks. She stated she started using neosporin and some epsom salt soaks a few days ago and it is now improving. No fevers, significant pain nor swelling. She was informed that if it should worsen or fail to improve she can call back.  - Continue with soaks and antibiotic ointment - Instructed to call if worsening symptoms

## 2018-11-03 NOTE — Assessment & Plan Note (Signed)
BP checked recently at home reportedly in the 130s/80s. Previous check in office was 142/72. She continues to tolerate her medications well and is requesting refills today, which will be provided. Will continue to monitor and evaluate for need for medication adjustment when patient is able to come in for in-person visit. She remains well controlled at this time. - Losartan 100mg  Daily - HCTZ 12.5mg  Daily - Continue Lifestyle Modifications

## 2018-11-03 NOTE — Assessment & Plan Note (Addendum)
Patient continues to experience floaters in her left eye. No change in symptoms at this time. No pain, no decrease in vision, continues to come and go. - Will continue to monitor and refer to opthalmology at next evaluation given age, history of HTN, and persistence of symptoms. - She will likely need assistance with affording an ophthalmology appointment as she is self pay

## 2018-11-03 NOTE — Progress Notes (Signed)
   This is a telephone encounter between [employer] and Jenna May on 11/03/2018. The visit was conducted with the patient located at home and Jenna May at [facility]. The patient's identity was confirmed using their DOB and current address. The patient has consented to being evaluated through a telephone encounter and understands the associated risks (an examination cannot be done and the patient may need to come in for an appointment) / benefits (allows the patient to remain at home, decreasing exposure to coronavirus). I personally spent 7 minutes on medical discussion.   CC: Hypertension, Vision changes, Paronychia of right toe  HPI:   [name] is a 63 y.o. F with PMHx listed below presenting for Hypertension, Vision changes, Paronychia of right toe. Please see the A&P for the status of the patient's chronic medical problems.   Past Medical History:  Diagnosis Date  . Anxiety   . Bartholin cyst 06/2003  . 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 Gout   . Hypertension   . 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 Normocytic anemia    Unclear etiology. Anemia panel in 03/2010 showing Iron 53 , TIBC 303 , iron saturation percentage 17, ferritin 197 , B12 and folate within normal limits.  . Postmenopausal status   . Tobacco abuse    Quit 2009, after approximately 15 pack year smoking history.  Marland Kitchen Umbilical hernia    S/P repair approximately 1995   Review of Systems: Performed and all others negative.  Physical Exam:   There were no vitals filed for this visit. Not performed for tele-health encounter  Assessment & Plan:   See Encounters Tab for problem based charting.  Patient discussed with Dr. Daryll Drown

## 2018-11-06 NOTE — Progress Notes (Signed)
Internal Medicine Clinic Attending  Case discussed with Dr. Melvin soon after the resident saw the patient.  We reviewed the resident's history, telephone conversation and pertinent patient test results.  I agree with the assessment, diagnosis, and plan of care documented in the resident's note.   

## 2018-12-30 ENCOUNTER — Other Ambulatory Visit: Payer: Self-pay

## 2018-12-30 ENCOUNTER — Ambulatory Visit: Payer: Self-pay

## 2019-01-06 ENCOUNTER — Other Ambulatory Visit: Payer: Self-pay

## 2019-01-06 ENCOUNTER — Encounter: Payer: Self-pay | Admitting: Internal Medicine

## 2019-01-06 ENCOUNTER — Ambulatory Visit: Payer: Self-pay | Admitting: Internal Medicine

## 2019-01-06 VITALS — BP 149/82 | HR 114 | Temp 98.6°F | Wt 199.1 lb

## 2019-01-06 DIAGNOSIS — H5310 Unspecified subjective visual disturbances: Secondary | ICD-10-CM

## 2019-01-06 DIAGNOSIS — Z87898 Personal history of other specified conditions: Secondary | ICD-10-CM | POA: Insufficient documentation

## 2019-01-06 DIAGNOSIS — D649 Anemia, unspecified: Secondary | ICD-10-CM

## 2019-01-06 DIAGNOSIS — I10 Essential (primary) hypertension: Secondary | ICD-10-CM

## 2019-01-06 DIAGNOSIS — Z79899 Other long term (current) drug therapy: Secondary | ICD-10-CM

## 2019-01-06 DIAGNOSIS — R000 Tachycardia, unspecified: Secondary | ICD-10-CM

## 2019-01-06 DIAGNOSIS — R011 Cardiac murmur, unspecified: Secondary | ICD-10-CM

## 2019-01-06 DIAGNOSIS — Z0000 Encounter for general adult medical examination without abnormal findings: Secondary | ICD-10-CM

## 2019-01-06 DIAGNOSIS — Z862 Personal history of diseases of the blood and blood-forming organs and certain disorders involving the immune mechanism: Secondary | ICD-10-CM

## 2019-01-06 DIAGNOSIS — L03031 Cellulitis of right toe: Secondary | ICD-10-CM

## 2019-01-06 DIAGNOSIS — M25432 Effusion, left wrist: Secondary | ICD-10-CM

## 2019-01-06 HISTORY — DX: Personal history of other specified conditions: Z87.898

## 2019-01-06 MED ORDER — CEPHALEXIN 500 MG PO CAPS: 500.0000 mg | ORAL_CAPSULE | Freq: Three times a day (TID) | ORAL | 0 refills | Status: AC

## 2019-01-06 NOTE — Assessment & Plan Note (Signed)
As discussed during previous tele-visit; patients persistent visual change in her left eye warrant further evaluation given it;s persistence, her age, and her history of HTN. Will place referral. - Referral to opthalmology

## 2019-01-06 NOTE — Patient Instructions (Signed)
Thank you for allowing [place] to care for you  For your vision changes - We have placed a referral to the eye doctor for you  For your high blood pressure - Checking labs today - We may make medication changes based on these results  For your R toe pain - Continue with Foot soaks and antibiotic ointment three times a day - We have prescribed an antibiotic (Keflex) Take this 3 times per day for 5 days  We will check and EKG at your next appointment

## 2019-01-06 NOTE — Assessment & Plan Note (Signed)
Patient noted to have tachycardia today. Initially 114 then improved to ~100. She has had some episodes of elevated HR in the past but no prior EKG in chart. On exam, she is RRR with systolic murmur noted. No symptoms of palpitation or SOB reported. - Will defer EKG as patient finishes paperwork for assistance program - EKG at next follow up visit

## 2019-01-06 NOTE — Progress Notes (Signed)
   CC: Hypertension, Left eye vision changes, Left wrist swelling, Tachycardia, Right great toe paronychia  HPI:  [name] is a 63 y.o. F with PMHx listed below presenting for Hypertension, Left eye vision changes, Left wrist swelling, Tachycardia, Right great toe paronychia. Please see the A&P for the status of the patient's chronic medical problems.  Past Medical History:  Diagnosis Date  . Anxiety   . Bartholin cyst 06/2003  . 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 Gout   . Hypertension   . 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 Normocytic anemia    Unclear etiology. Anemia panel in 03/2010 showing Iron 53 , TIBC 303 , iron saturation percentage 17, ferritin 197 , B12 and folate within normal limits.  . Postmenopausal status   . Tobacco abuse    Quit 2009, after approximately 15 pack year smoking history.  Marland Kitchen Umbilical hernia    S/P repair approximately 1995  . Urine abnormality 05/13/2017   Review of Systems:  Performed and all others negative.  Physical Exam:  Vitals:   01/06/19 0908  BP: (!) 149/82  Pulse: (!) 114  Temp: 98.6 F (37 C)  TempSrc: Oral  SpO2: 100%  Weight: 199 lb 1.8 oz (90.3 kg)   Physical Exam Constitutional:      General: She is not in acute distress.    Appearance: Normal appearance.  Cardiovascular:     Rate and Rhythm: Normal rate and regular rhythm.     Pulses: Normal pulses.     Heart sounds: Murmur present.  Pulmonary:     Effort: Pulmonary effort is normal. No respiratory distress.     Breath sounds: Normal breath sounds.  Abdominal:     General: Bowel sounds are normal. There is no distension.     Palpations: Abdomen is soft.     Tenderness: There is no abdominal tenderness.  Musculoskeletal:        General: No swelling or deformity.  Skin:    General: Skin is warm and dry.     Comments: Paronychia noted on R great toe, see image No fluctuance noted, Small  about of drainage at medial nail bed  Neurological:     General: No focal deficit present.     Mental Status: Mental status is at baseline.      Assessment & Plan:   See Encounters Tab for problem based charting.  Patient discussed with Dr. Daryll Drown

## 2019-01-06 NOTE — Assessment & Plan Note (Addendum)
BP today 149/82. BP remains elevated into the 893Y systolic. Patient will need medication adjustment for better control of her her BP. We will first need to obtain labs as she has not had repeat labs since starting her HCTZ (these had been deferred due to self-pay status). Plan will be to increase HCTZ if lab values are appropriate. - Losartan 100mg  Daily - HCTZ 12.5mg  Daily - CMP

## 2019-01-06 NOTE — Assessment & Plan Note (Signed)
Screening CBC and CMP today

## 2019-01-06 NOTE — Assessment & Plan Note (Addendum)
Right great toe paronychia has persistent for about 6 weeks now, despite Neosporin and soaks 2-3 times per day. Patient states this is not painful and has been improving, but slowly. On exam the is inflammation and mild swelling at medial nail fold of her R great toe. No fluctuance noted, but some drainage at nail fold was present. Will prescribe 5 days of Keflex for due to persistent nature of her problem. - TID warm foot soaks - TID Triple antibiotic (following foot soaks) - Keflex 500mg  TID

## 2019-01-06 NOTE — Assessment & Plan Note (Signed)
Patient has a history of normocytic anemia. Work up in 2017 was unrevealing. Hgb had been stable at ~10.5 from 2010-2017. It has been 2+ years since last CBC, will repeat today - CBC

## 2019-01-07 LAB — CBC WITH DIFFERENTIAL/PLATELET
Basophils Absolute: 0 10*3/uL (ref 0.0–0.2)
Basos: 0 %
EOS (ABSOLUTE): 0 10*3/uL (ref 0.0–0.4)
Eos: 0 %
Hematocrit: 33.6 % — ABNORMAL LOW (ref 34.0–46.6)
Hemoglobin: 11.1 g/dL (ref 11.1–15.9)
Immature Grans (Abs): 0 10*3/uL (ref 0.0–0.1)
Immature Granulocytes: 0 %
Lymphocytes Absolute: 2.3 10*3/uL (ref 0.7–3.1)
Lymphs: 33 %
MCH: 29.1 pg (ref 26.6–33.0)
MCHC: 33 g/dL (ref 31.5–35.7)
MCV: 88 fL (ref 79–97)
Monocytes Absolute: 0.4 10*3/uL (ref 0.1–0.9)
Monocytes: 6 %
Neutrophils Absolute: 4.2 10*3/uL (ref 1.4–7.0)
Neutrophils: 61 %
Platelets: 151 10*3/uL (ref 150–450)
RBC: 3.81 x10E6/uL (ref 3.77–5.28)
RDW: 13.2 % (ref 11.7–15.4)
WBC: 7 10*3/uL (ref 3.4–10.8)

## 2019-01-07 LAB — CMP14 + ANION GAP
ALT: 45 IU/L — ABNORMAL HIGH (ref 0–32)
AST: 43 IU/L — ABNORMAL HIGH (ref 0–40)
Albumin/Globulin Ratio: 1.4 (ref 1.2–2.2)
Albumin: 4.9 g/dL (ref 3.8–4.9)
Alkaline Phosphatase: 97 IU/L (ref 39–117)
Anion Gap: 23 mmol/L — ABNORMAL HIGH (ref 10.0–18.0)
BUN/Creatinine Ratio: 19 (ref 12–28)
BUN: 16 mg/dL (ref 8–27)
Bilirubin Total: 0.4 mg/dL (ref 0.0–1.2)
CO2: 16 mmol/L — ABNORMAL LOW (ref 20–29)
Calcium: 9.8 mg/dL (ref 8.7–10.3)
Chloride: 97 mmol/L (ref 96–106)
Creatinine, Ser: 0.86 mg/dL (ref 0.57–1.00)
GFR calc Af Amer: 85 mL/min/{1.73_m2} (ref >59)
GFR calc non Af Amer: 74 mL/min/{1.73_m2} (ref >59)
Globulin, Total: 3.5 g/dL (ref 1.5–4.5)
Glucose: 111 mg/dL — ABNORMAL HIGH (ref 65–99)
Potassium: 4.1 mmol/L (ref 3.5–5.2)
Sodium: 136 mmol/L (ref 134–144)
Total Protein: 8.4 g/dL (ref 6.0–8.5)

## 2019-01-09 ENCOUNTER — Encounter: Payer: Self-pay | Admitting: Internal Medicine

## 2019-01-09 NOTE — Progress Notes (Signed)
Letter sent about recent lab results. Mildly elevated Liver Enzymes noted, will monitor these results.

## 2019-01-10 NOTE — Progress Notes (Signed)
Internal Medicine Clinic Attending  Case discussed with Dr. Melvin  at the time of the visit.  We reviewed the resident's history and exam and pertinent patient test results.  I agree with the assessment, diagnosis, and plan of care documented in the resident's note.  

## 2019-04-13 ENCOUNTER — Other Ambulatory Visit: Payer: Self-pay

## 2019-04-13 ENCOUNTER — Ambulatory Visit (INDEPENDENT_AMBULATORY_CARE_PROVIDER_SITE_OTHER): Payer: Self-pay | Admitting: Internal Medicine

## 2019-04-13 ENCOUNTER — Ambulatory Visit (HOSPITAL_COMMUNITY)
Admission: RE | Admit: 2019-04-13 | Discharge: 2019-04-13 | Disposition: A | Discharge status: Home / Self Care | Payer: Self-pay | Source: Ambulatory Visit | Attending: Family Medicine | Admitting: Family Medicine

## 2019-04-13 ENCOUNTER — Encounter: Payer: Self-pay | Admitting: Internal Medicine

## 2019-04-13 ENCOUNTER — Telehealth: Payer: Self-pay | Admitting: Internal Medicine

## 2019-04-13 VITALS — BP 165/78 | HR 94 | Temp 100.0°F | Ht 66.0 in | Wt 191.2 lb

## 2019-04-13 DIAGNOSIS — R011 Cardiac murmur, unspecified: Secondary | ICD-10-CM

## 2019-04-13 DIAGNOSIS — R748 Abnormal levels of other serum enzymes: Secondary | ICD-10-CM | POA: Insufficient documentation

## 2019-04-13 DIAGNOSIS — Z79899 Other long term (current) drug therapy: Secondary | ICD-10-CM

## 2019-04-13 DIAGNOSIS — Z87898 Personal history of other specified conditions: Secondary | ICD-10-CM | POA: Insufficient documentation

## 2019-04-13 DIAGNOSIS — L03031 Cellulitis of right toe: Secondary | ICD-10-CM

## 2019-04-13 DIAGNOSIS — Z792 Long term (current) use of antibiotics: Secondary | ICD-10-CM

## 2019-04-13 DIAGNOSIS — I10 Essential (primary) hypertension: Secondary | ICD-10-CM

## 2019-04-13 DIAGNOSIS — R000 Tachycardia, unspecified: Secondary | ICD-10-CM | POA: Insufficient documentation

## 2019-04-13 DIAGNOSIS — L731 Pseudofolliculitis barbae: Secondary | ICD-10-CM | POA: Insufficient documentation

## 2019-04-13 DIAGNOSIS — K7460 Unspecified cirrhosis of liver: Secondary | ICD-10-CM | POA: Insufficient documentation

## 2019-04-13 DIAGNOSIS — R7401 Elevation of levels of liver transaminase levels: Secondary | ICD-10-CM | POA: Insufficient documentation

## 2019-04-13 DIAGNOSIS — M1A09X0 Idiopathic chronic gout, multiple sites, without tophus (tophi): Secondary | ICD-10-CM | POA: Insufficient documentation

## 2019-04-13 DIAGNOSIS — M109 Gout, unspecified: Secondary | ICD-10-CM

## 2019-04-13 HISTORY — DX: Pseudofolliculitis barbae: L73.1

## 2019-04-13 MED ORDER — INDOMETHACIN 50 MG PO CAPS: 50.0000 mg | ORAL_CAPSULE | Freq: Three times a day (TID) | ORAL | 0 refills | Status: DC

## 2019-04-13 MED ORDER — INDOMETHACIN 50 MG PO CAPS: 50.0000 mg | ORAL_CAPSULE | Freq: Three times a day (TID) | ORAL | 0 refills | Status: AC

## 2019-04-13 MED ORDER — DOXYCYCLINE HYCLATE 100 MG PO TABS: 100.0000 mg | ORAL_TABLET | Freq: Two times a day (BID) | ORAL | 0 refills | Status: AC

## 2019-04-13 NOTE — Assessment & Plan Note (Addendum)
Mildly elevated AST and ALT on recent lab work. Will recheck and consider [place] if elevated. - CMP  ADDENDUM:  - Lab work showed improved, but persistently elevated AST And ALT. Liver Ultrasound with doppler ordered.  - CMP also showed mildly elevated Cr to 1.3, she will benefit from recheck at follow up as she is taking NSAID for gout flare (she was instructed to stay well hydrated).  ADDENDUM #2: - Liver Ultrasound was read as normal, letter mailed to patient.

## 2019-04-13 NOTE — Assessment & Plan Note (Signed)
>>  ASSESSMENT AND PLAN FOR ELEVATED LIVER ENZYMES WRITTEN ON 08/17/2019  2:16 PM BY MELVIN, ALEXANDER, MD  Mildly elevated AST and ALT on recent lab work. Will recheck and consider US  if elevated. - CMP  ADDENDUM:  - Lab work showed improved, but persistently elevated AST And ALT. Liver Ultrasound with doppler ordered.  - CMP also showed mildly elevated Cr to 1.3, she will benefit from recheck at follow up as she is taking NSAID for gout flare (she was instructed to stay well hydrated).  ADDENDUM #2: - Liver Ultrasound was read as normal, letter mailed to patient.

## 2019-04-13 NOTE — Assessment & Plan Note (Addendum)
BP elevated today to 165/78. This is up from 149/82 at her last visit despite increase in her HCTZ dose. She is, however, in significant pain from untreated paronychia and suspected gout flare. Will recheck after resolution of these acute issues. - Losartan 100mg  Daily - HCTZ 25mg  Daily  ADDENDUM: Will need to consider alternative regimen if gout flares remain an issue.

## 2019-04-13 NOTE — Assessment & Plan Note (Addendum)
Patient has likely gout flare of her right wrist today. She has had 2 months of waxing and waning left wrist pain and swelling at 5-10/10 pain level. She denies fevers or chills. She has been taking aleve about 2 times per week which helps the pain. She has had flares present in her hands in the past, most recently present to Urgent care in April of 2019 with similar symptoms. Will treat with indomethacin due to cost of colchicine. She does not take her allopurinol and given she only has 1-2 flares per year, I will remove this from her medications for now. - Indomethacin 50mg  TID, take until 2 days after resolution - Follow up in about 11 days to ensure resolution  ADDENDUM: lab work showed mild elevation in Cr, will benefit from recheck as she is on NSAID for her flare.

## 2019-04-13 NOTE — Progress Notes (Signed)
   CC: Left Wrist pain, Paronychia, Ingrown hair, HTN, Tachycardia  HPI:  Jenna May is a 63 y.o. F with PMHx listed below presenting for Left Wrist pain, Paronychia, Ingrown hair, HTN, Tachycardia. Please see the A&P for the status of the patient's chronic medical problems.  Past Medical History:  Diagnosis Date  . ANEMIA, NORMOCYTIC 06/14/2006  . Anxiety   . Bartholin cyst 06/2003  . 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 Gout   . Hypertension   . 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 LIPOMA 08/19/2006   Adjacent to proximal right biceps tendon MRI 09/06    . Normocytic anemia    Unclear etiology. Anemia panel in 03/2010 showing Iron 53 , TIBC 303 , iron saturation percentage 17, ferritin 197 , B12 and folate within normal limits.  . Paronychia of toe, right 11/03/2018  . Postmenopausal status   . Tobacco abuse    Quit 2009, after approximately 15 pack year smoking history.  Marland Kitchen Umbilical hernia    S/P repair approximately 1995  . Urine abnormality 05/13/2017   Review of Systems:  Performed and all others negative.  Physical Exam:  Vitals:   04/13/19 1502 04/13/19 1503  BP:  (!) 165/78  Pulse:  94  Temp:  100 F (37.8 C)  TempSrc:  Oral  SpO2:  100%  Weight: 191 lb 3.2 oz (86.7 kg)   Height: 5\' 6"  (1.676 m)    Physical Exam Constitutional:      General: She is not in acute distress.    Appearance: Normal appearance.  Cardiovascular:     Rate and Rhythm: Normal rate and regular rhythm.     Pulses: Normal pulses.     Heart sounds: Murmur present.  Pulmonary:     Effort: Pulmonary effort is normal. No respiratory distress.     Breath sounds: Normal breath sounds.  Abdominal:     General: Bowel sounds are normal. There is no distension.     Palpations: Abdomen is soft.     Tenderness: There is no abdominal tenderness.  Musculoskeletal:        General: Swelling and deformity present.   Comments: Warm erythematous Left wrist. TTP of carpal bones.  Skin:    General: Skin is warm and dry.     Comments: Paronychia noted on R great toe, see image. Possible fluctuance and trace drainage. TTP. Scant drainage from ingrown hair at crease of Pannus. No fluctuance, erythema nor warmth.  Neurological:     General: No focal deficit present.     Mental Status: Mental status is at baseline.    Assessment & Plan:   See Encounters Tab for problem based charting.  Patient discussed with Dr. Philipp Ovens

## 2019-04-13 NOTE — Patient Instructions (Addendum)
Thank you for allowing [place] to care for you  For your in-grown toe nail and ingrown hair - Take Doxycycline two times a day for 10 days - Avoid sun exposure while on this medication - Referral to foot doctor for further evaluation - Follow up in 11 days in [facility]  For your swollen left wrist - This is believed to be a gout flair - Take Indomethcin three times a day, until flare improves - Follow up in 11 days  For your fast hear rate - EKG today looked okay - We will continue to monitor when other problems have improved  For your blood pressure - Elevated today, will recheck when your pain is improved  For your vision changes - We will look in to your referral  We will recheck labs to look at liver function, if elevated we may get an ultrasound  We will wait to give flu vaccine at follow up

## 2019-04-13 NOTE — Telephone Encounter (Signed)
Received call from patient, [facility] reports [facility] not [facility] the indomethacin that she had been prescribed, and she wanted the prescription sent to [facility]. Reviewed chart and this is prescribed for a gout flare. Will send in prescription for Indomethacin.

## 2019-04-13 NOTE — Assessment & Plan Note (Addendum)
Patient has persistent paronychia of right great toe. She was prescibed keflex in June, but only took this for 3 days due to GI side effects. Medial great toe at toenail remains inflamed with some purulence and possible abscess. Toe is now tender. Will start on Doxycycline for MRSA coverage given possible abscess and refer to podiatry to address possible abscess. - Doxycycline 100mg  BID x10d - Podiatry referral - Follow up in [facility] in about 11 days - Continue to keep area clean  ADDENDUM: Patient contacted by phone and was taking doxycycline only daily. She was instructed to increase to BID and agreed to do so

## 2019-04-13 NOTE — Assessment & Plan Note (Signed)
Patient complain of drainage at crease of her pannus at suprapubic region. She states it is nonpainful and drainage has improved with soaks. On exam the is scant mildly purulent drainage. No fluctuance or erythema noted. She is being prescribed Doxycyline for her paronychia, which will cover for any organisms related to this if present. - Monitor for resolution - Patient prescribed Doxycycline for Paronychia

## 2019-04-13 NOTE — Assessment & Plan Note (Signed)
Patient has a history of tachycardia and complains of intermittent palpations. HR 94 today and EKG performed shows sinus rhythm with 1 PAC and prolonged QTc. Will continue to monitor and avoid QT prolonging medications.

## 2019-04-14 LAB — CMP14 + ANION GAP
ALT: 44 IU/L — ABNORMAL HIGH (ref 0–32)
AST: 31 IU/L (ref 0–40)
Albumin/Globulin Ratio: 1.2 (ref 1.2–2.2)
Albumin: 4.7 g/dL (ref 3.8–4.9)
Alkaline Phosphatase: 113 IU/L (ref 39–117)
Anion Gap: 18 mmol/L (ref 10.0–18.0)
BUN/Creatinine Ratio: 18 (ref 12–28)
BUN: 24 mg/dL (ref 8–27)
Bilirubin Total: 0.6 mg/dL (ref 0.0–1.2)
CO2: 17 mmol/L — ABNORMAL LOW (ref 20–29)
Calcium: 10.4 mg/dL — ABNORMAL HIGH (ref 8.7–10.3)
Chloride: 99 mmol/L (ref 96–106)
Creatinine, Ser: 1.35 mg/dL — ABNORMAL HIGH (ref 0.57–1.00)
GFR calc Af Amer: 49 mL/min/{1.73_m2} — ABNORMAL LOW (ref >59)
GFR calc non Af Amer: 43 mL/min/{1.73_m2} — ABNORMAL LOW (ref >59)
Globulin, Total: 3.8 g/dL (ref 1.5–4.5)
Glucose: 98 mg/dL (ref 65–99)
Potassium: 3.7 mmol/L (ref 3.5–5.2)
Sodium: 134 mmol/L (ref 134–144)
Total Protein: 8.5 g/dL (ref 6.0–8.5)

## 2019-04-14 NOTE — Progress Notes (Signed)
Internal Medicine Clinic Attending  Case discussed with Dr. Melvin  at the time of the visit.  We reviewed the resident's history and exam and pertinent patient test results.  I agree with the assessment, diagnosis, and plan of care documented in the resident's note.  

## 2019-04-18 ENCOUNTER — Telehealth: Payer: Self-pay | Admitting: Internal Medicine

## 2019-04-18 DIAGNOSIS — R748 Abnormal levels of other serum enzymes: Secondary | ICD-10-CM

## 2019-04-18 NOTE — Telephone Encounter (Signed)
Spoke with patient over the phone regarding her lab work and medications. She was informed that her liver function was slightly better but we would still need to get an ultrasound. We discussed that she may have had a mild AKI, but we would recheck this as her follow up appointment on Monday (given she is on indomethacin for her gout flare). Her Left wrist is improving per her report. She states she was taking doxycycline only daily at this point, but will increase this to twice daily. - Follow up on 9/28 for reeveal of wrist, foot, and BMP - Liver U/S - Reiterated taking medications as prescribed

## 2019-04-24 ENCOUNTER — Other Ambulatory Visit: Payer: Self-pay

## 2019-04-24 ENCOUNTER — Ambulatory Visit: Payer: Self-pay | Admitting: Internal Medicine

## 2019-04-24 VITALS — BP 156/71 | HR 88 | Temp 98.8°F | Wt 190.1 lb

## 2019-04-24 DIAGNOSIS — R197 Diarrhea, unspecified: Secondary | ICD-10-CM

## 2019-04-24 DIAGNOSIS — Z23 Encounter for immunization: Secondary | ICD-10-CM

## 2019-04-24 DIAGNOSIS — Z79899 Other long term (current) drug therapy: Secondary | ICD-10-CM

## 2019-04-24 DIAGNOSIS — M1A09X0 Idiopathic chronic gout, multiple sites, without tophus (tophi): Secondary | ICD-10-CM

## 2019-04-24 DIAGNOSIS — M109 Gout, unspecified: Secondary | ICD-10-CM

## 2019-04-24 DIAGNOSIS — Z792 Long term (current) use of antibiotics: Secondary | ICD-10-CM

## 2019-04-24 DIAGNOSIS — R110 Nausea: Secondary | ICD-10-CM

## 2019-04-24 DIAGNOSIS — L03031 Cellulitis of right toe: Secondary | ICD-10-CM

## 2019-04-24 DIAGNOSIS — I10 Essential (primary) hypertension: Secondary | ICD-10-CM

## 2019-04-24 NOTE — Patient Instructions (Addendum)
Jenna May,  Please continue taking your antibiotic (doxycycline) and gout medication (idomethacin). Call our office or go to the emergency room if you begin having fevers, chills, worsening wrist pain, or redness of the joint at home. Please try and get an x-ray of the joint in the coming days.  Follow-up with our office in 1-2 weeks.

## 2019-04-24 NOTE — Progress Notes (Signed)
   CC: paronychia and L wrist pain  HPI:  Jenna May is a 63 y.o. F with significant PMH as outlined below who presents today for follow-up of her paronychia, HTN, and L wrist pain. Please see problem-based charting for additional information.  Past Medical History:  Diagnosis Date  . ANEMIA, NORMOCYTIC 06/14/2006  . Anxiety   . Bartholin cyst 06/2003  . 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 Gout   . Hypertension   . 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 LIPOMA 08/19/2006   Adjacent to proximal right biceps tendon MRI 09/06    . Normocytic anemia    Unclear etiology. Anemia panel in 03/2010 showing Iron 53 , TIBC 303 , iron saturation percentage 17, ferritin 197 , B12 and folate within normal limits.  . Paronychia of toe, right 11/03/2018  . Postmenopausal status   . Tobacco abuse    Quit 2009, after approximately 15 pack year smoking history.  Marland Kitchen Umbilical hernia    S/P repair approximately 1995  . Urine abnormality 05/13/2017   Review of Systems:   Review of Systems  Constitutional: Negative for chills and fever.  Gastrointestinal: Positive for diarrhea and nausea. Negative for abdominal pain, constipation and vomiting.  Musculoskeletal: Negative for joint pain.       L wrist swelling.   Skin:       Healing wound on R great toe.   Physical Exam:  Vitals:   04/24/19 1555  BP: (!) 156/71  Pulse: 88  Temp: 98.8 F (37.1 C)  TempSrc: Oral  SpO2: 100%  Weight: 190 lb 1.6 oz (86.2 kg)   Physical Exam Vitals signs and nursing note reviewed.  Constitutional:      General: She is not in acute distress.    Appearance: Normal appearance. She is not ill-appearing.  Cardiovascular:     Rate and Rhythm: Normal rate and regular rhythm.     Heart sounds: Normal heart sounds.  Pulmonary:     Effort: Pulmonary effort is normal.     Breath sounds: Normal breath sounds.  Musculoskeletal:        General:  Swelling (L wrist) present.     Comments: Limited ROM in L wrist due to surrounding swelling. Warm to touch. No overlaying erythema.  Skin:    Comments: Healing R great toe wound. Minimal drainage. Non-tender.  Neurological:     Mental Status: She is alert.          Assessment & Plan:   See Encounters Tab for problem based charting.  Patient seen with Dr. Philipp Ovens

## 2019-04-25 LAB — CBC WITH DIFFERENTIAL/PLATELET
Basophils Absolute: 0.1 10*3/uL (ref 0.0–0.2)
Basos: 1 %
EOS (ABSOLUTE): 0.1 10*3/uL (ref 0.0–0.4)
Eos: 1 %
Hematocrit: 31.7 % — ABNORMAL LOW (ref 34.0–46.6)
Hemoglobin: 10.4 g/dL — ABNORMAL LOW (ref 11.1–15.9)
Immature Grans (Abs): 0 10*3/uL (ref 0.0–0.1)
Immature Granulocytes: 0 %
Lymphocytes Absolute: 3.1 10*3/uL (ref 0.7–3.1)
Lymphs: 37 %
MCH: 28.3 pg (ref 26.6–33.0)
MCHC: 32.8 g/dL (ref 31.5–35.7)
MCV: 86 fL (ref 79–97)
Monocytes Absolute: 0.6 10*3/uL (ref 0.1–0.9)
Monocytes: 7 %
Neutrophils Absolute: 4.6 10*3/uL (ref 1.4–7.0)
Neutrophils: 54 %
Platelets: 166 10*3/uL (ref 150–450)
RBC: 3.68 x10E6/uL — ABNORMAL LOW (ref 3.77–5.28)
RDW: 12.8 % (ref 11.7–15.4)
WBC: 8.5 10*3/uL (ref 3.4–10.8)

## 2019-05-01 NOTE — Assessment & Plan Note (Signed)
Pt with persistent paronychia of R great toe. Prescribed doxycycline 100mg  BID on 9/22, which she states she takes one or two times daily because it "makes her feel some type of way." She believes the wound to be healing, stating it is less tender. Pt still needs to schedule an appointment with podiatry.  Plan - pt agreeable to finishing the antibiotic course as prescribed twice daily  - finish additional 4 days of doxycycline - podiatry referral to address potential underlying abscess

## 2019-05-01 NOTE — Assessment & Plan Note (Signed)
Pt seen last week for swelling in her wrist attributed to a gout flare. She has been taking Indomethacin 50mg  TID. Endorses that the pain and swelling have improved since taking the indomethacin and doxycycline that was prescribed for her R great toe paronychia. Denies fevers or chills at home. Because her joint pain improved with initiation of antibiotic, there is concern for septic joint. L wrist joint arthrocentesis performed this afternoon was unsuccessful, so no joint fluid was able to be collected for analysis. CBC collected in clinic was reassuring with WBC 8.5. Pt was instructed to get radiographic imaging of the wrist this week and go to the emergency room if she began having fevers, chills, or worsening wrist pain/swelling/redness of the joint at home. Pt expressed concern over having transportation to get to the radiology department during business hours. Unfortunately, she left the office before clinic staff were able to help with transportation assistance.   Plan - continue indomethacin 50mg  TID - order L wrist x-ray - CBC with diff - follow-up in clinic in 1-2 weeks - front desk staff will reach back out to help with transportation arrangements   Procedure Note Indication:  Rule out septic joint Operators: Drs Humza Tallerico/Guilloud   The patient was provided with risks, benefits, and alternatives to joint arthrocentesis. She consented to L wrist joint arthrocentesis for septic joint rule out.  After a time out was preformed, the wrist was prepped in a sterile fashion. The site was anesthetized with 1.5 cc of 1% lidocaine in a 23 gauge needle. Three attempts to enter the joint space with a 23 gauge needle were not successful. The patient tolerated the procedures well without complication.

## 2019-05-02 NOTE — Progress Notes (Signed)
Internal Medicine Clinic Attending  I saw and evaluated the patient.  I personally confirmed the key portions of the history and exam documented by Dr. Ronnald Ramp and I reviewed pertinent patient test results.  The assessment, diagnosis, and plan were formulated together and I agree with the documentation in the resident's note with the following corrections/additions:  Wrist arthrocentesis attempted due to concern for septic joint. Joint space was entered successfully by Dr. Ronnald Ramp on the first attempt but no fluid was able to be aspirated. Second attempt by myself was unsuccessful in entering joint space and procedure was then aborted. Overall her wrist swelling and effusion was improving on Doxycyline. Will obtained plain films of wrist for further evaluation and continue antibiotics. Follow up 1-2 weeks.

## 2019-05-12 ENCOUNTER — Encounter: Payer: Self-pay | Admitting: Gastroenterology

## 2019-05-12 ENCOUNTER — Other Ambulatory Visit: Payer: Self-pay | Admitting: Internal Medicine

## 2019-05-12 DIAGNOSIS — I10 Essential (primary) hypertension: Secondary | ICD-10-CM

## 2019-05-15 NOTE — Telephone Encounter (Signed)
Refill approved.

## 2019-06-13 ENCOUNTER — Other Ambulatory Visit: Payer: Self-pay | Admitting: Internal Medicine

## 2019-06-13 DIAGNOSIS — E781 Pure hyperglyceridemia: Secondary | ICD-10-CM

## 2019-06-13 DIAGNOSIS — I10 Essential (primary) hypertension: Secondary | ICD-10-CM

## 2019-06-13 NOTE — Telephone Encounter (Signed)
Refills approved.

## 2019-07-03 ENCOUNTER — Ambulatory Visit: Payer: Self-pay

## 2019-07-03 ENCOUNTER — Other Ambulatory Visit: Payer: Self-pay

## 2019-07-03 ENCOUNTER — Ambulatory Visit: Payer: Self-pay | Admitting: Internal Medicine

## 2019-07-13 ENCOUNTER — Other Ambulatory Visit: Payer: Self-pay | Admitting: Internal Medicine

## 2019-07-13 DIAGNOSIS — E781 Pure hyperglyceridemia: Secondary | ICD-10-CM

## 2019-07-13 DIAGNOSIS — I10 Essential (primary) hypertension: Secondary | ICD-10-CM

## 2019-07-17 ENCOUNTER — Other Ambulatory Visit: Payer: Self-pay | Admitting: Internal Medicine

## 2019-07-17 NOTE — Telephone Encounter (Signed)
Needs refill on   simvastatin (ZOCOR) 40 MG tablet  losartan (COZAAR) 100 MG tablet hydrochlorothiazide (HYDRODIURIL) 25 MG tablet [place], [place]  ;pt contact [phone]   Pt is completely out

## 2019-07-17 NOTE — Telephone Encounter (Signed)
Medications were refilled on 12/18 per Dr Trilby Drummer. Called pt - informed of refills; stated she had tried calling but no response, so she will have her brother pick them up.

## 2019-08-10 ENCOUNTER — Encounter: Payer: Self-pay | Admitting: *Deleted

## 2019-08-15 ENCOUNTER — Other Ambulatory Visit: Payer: Self-pay | Admitting: Internal Medicine

## 2019-08-15 DIAGNOSIS — I10 Essential (primary) hypertension: Secondary | ICD-10-CM

## 2019-08-15 DIAGNOSIS — E781 Pure hyperglyceridemia: Secondary | ICD-10-CM

## 2019-08-15 NOTE — Telephone Encounter (Signed)
Refills approved.

## 2019-08-16 ENCOUNTER — Telehealth: Payer: Self-pay | Admitting: *Deleted

## 2019-08-16 NOTE — Telephone Encounter (Signed)
Patient is calling upset as she has been [facility] to [facility] her [facility] and we have not responded back to them. States she is out of meds.

## 2019-08-16 NOTE — Telephone Encounter (Signed)
RTC, pt notified clinic was closed on Monday for a holiday.  RX refill request was received and forwarded to PCP yesterday and all RX's were filled yesterday.  Pt encouraged to call her pharmacy for refill request before she is completely out of medication as physicians generally have 24-48hours to fill medications.  Pt VU, states [facility] called her and informed her RX's were ready.  Pt sounds appreciative during conversation. SChaplin, RN,BSN

## 2019-08-17 ENCOUNTER — Other Ambulatory Visit: Payer: Self-pay

## 2019-08-17 ENCOUNTER — Encounter: Payer: Self-pay | Admitting: Internal Medicine

## 2019-08-17 ENCOUNTER — Ambulatory Visit (HOSPITAL_COMMUNITY)
Admission: RE | Admit: 2019-08-17 | Discharge: 2019-08-17 | Disposition: A | Discharge status: Home / Self Care | Payer: Self-pay | Source: Ambulatory Visit | Attending: Internal Medicine | Admitting: Internal Medicine

## 2019-08-17 DIAGNOSIS — R748 Abnormal levels of other serum enzymes: Secondary | ICD-10-CM | POA: Insufficient documentation

## 2019-08-17 NOTE — Progress Notes (Signed)
Patient sent letter informing her of her normal Liver Ultrasound.

## 2019-08-24 ENCOUNTER — Other Ambulatory Visit: Payer: Self-pay

## 2019-08-24 ENCOUNTER — Ambulatory Visit (INDEPENDENT_AMBULATORY_CARE_PROVIDER_SITE_OTHER): Payer: No Typology Code available for payment source | Admitting: Podiatry

## 2019-08-24 ENCOUNTER — Encounter: Payer: Self-pay | Admitting: Podiatry

## 2019-08-24 VITALS — BP 154/93 | HR 83 | Temp 97.9°F | Resp 16

## 2019-08-24 DIAGNOSIS — L600 Ingrowing nail: Secondary | ICD-10-CM

## 2019-08-24 NOTE — Progress Notes (Signed)
   Subjective:    Patient ID: Jenna May, female    DOB: 07-15-1956, 64 y.o.   MRN: [id number]  HPI    Review of Systems  All other systems reviewed and are negative.      Objective:   Physical Exam        Assessment & Plan:

## 2019-08-25 NOTE — Progress Notes (Signed)
Subjective:   Patient ID: Jenna May, female   DOB: 64 y.o.   MRN: [id number]   HPI Patient presents with nailbeds bother her   ROS      Objective:  Physical Exam  Crusted hallux nail right     Assessment:  Mild ingrown with mycotic nail infection     Plan:  Debridement of nail with no drainage and can soak and apply Band-Aid

## 2019-09-03 IMAGING — MG DIGITAL DIAGNOSTIC UNILATERAL LEFT MAMMOGRAM WITH TOMO AND CAD
4 series · 4 of 12 positions shown · non-contrast
Comparison: Previous exam(s).

CLINICAL DATA: Screening recall for asymmetry seen in the left
breast on the MLO view only.

EXAM:
DIGITAL DIAGNOSTIC UNILATERAL LEFT MAMMOGRAM WITH CAD AND TOMO

[L MLO synth-2D]
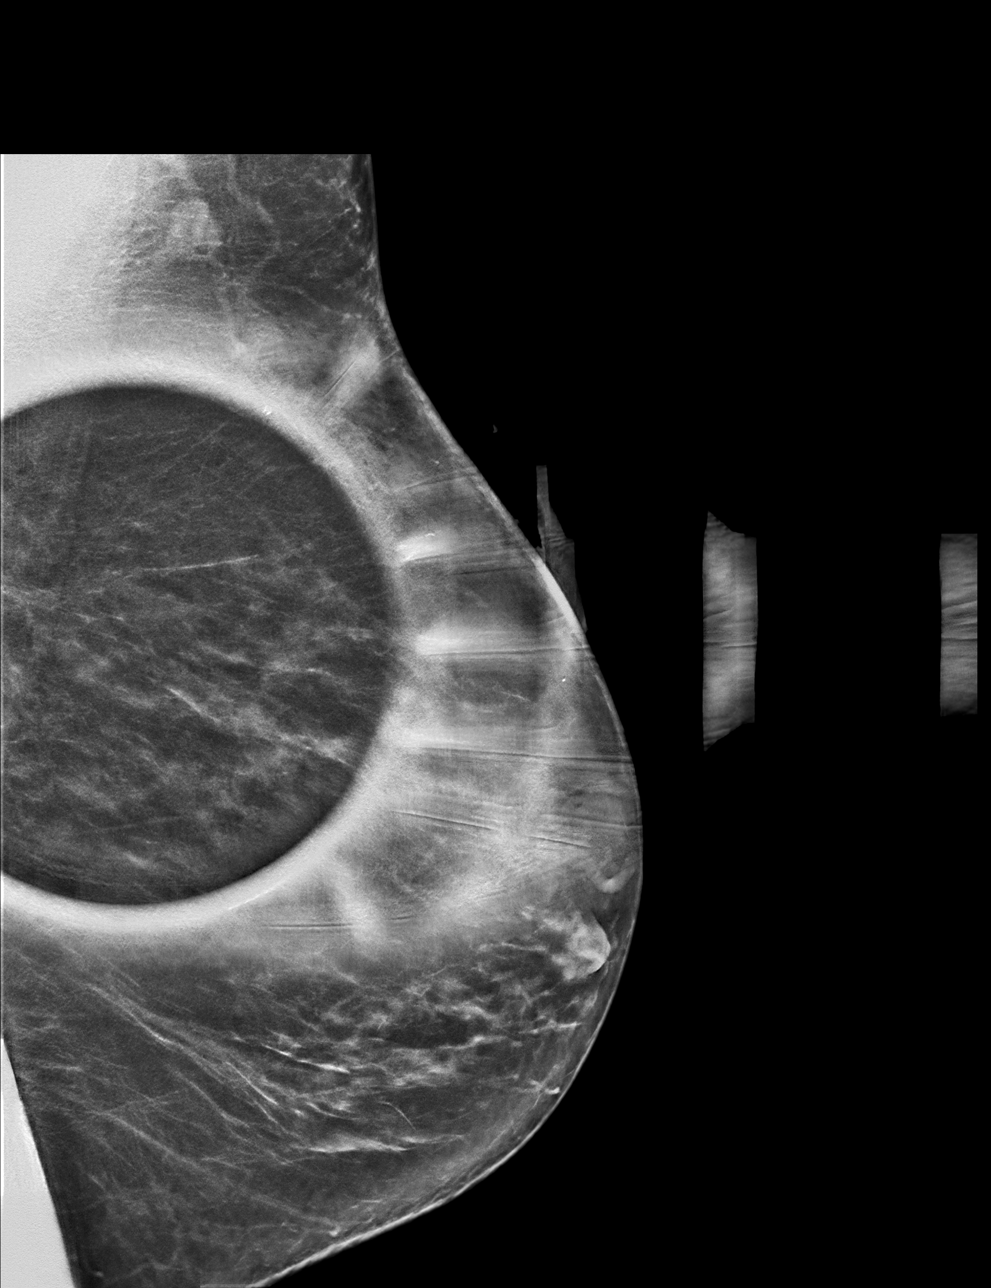

[L ML synth-2D]
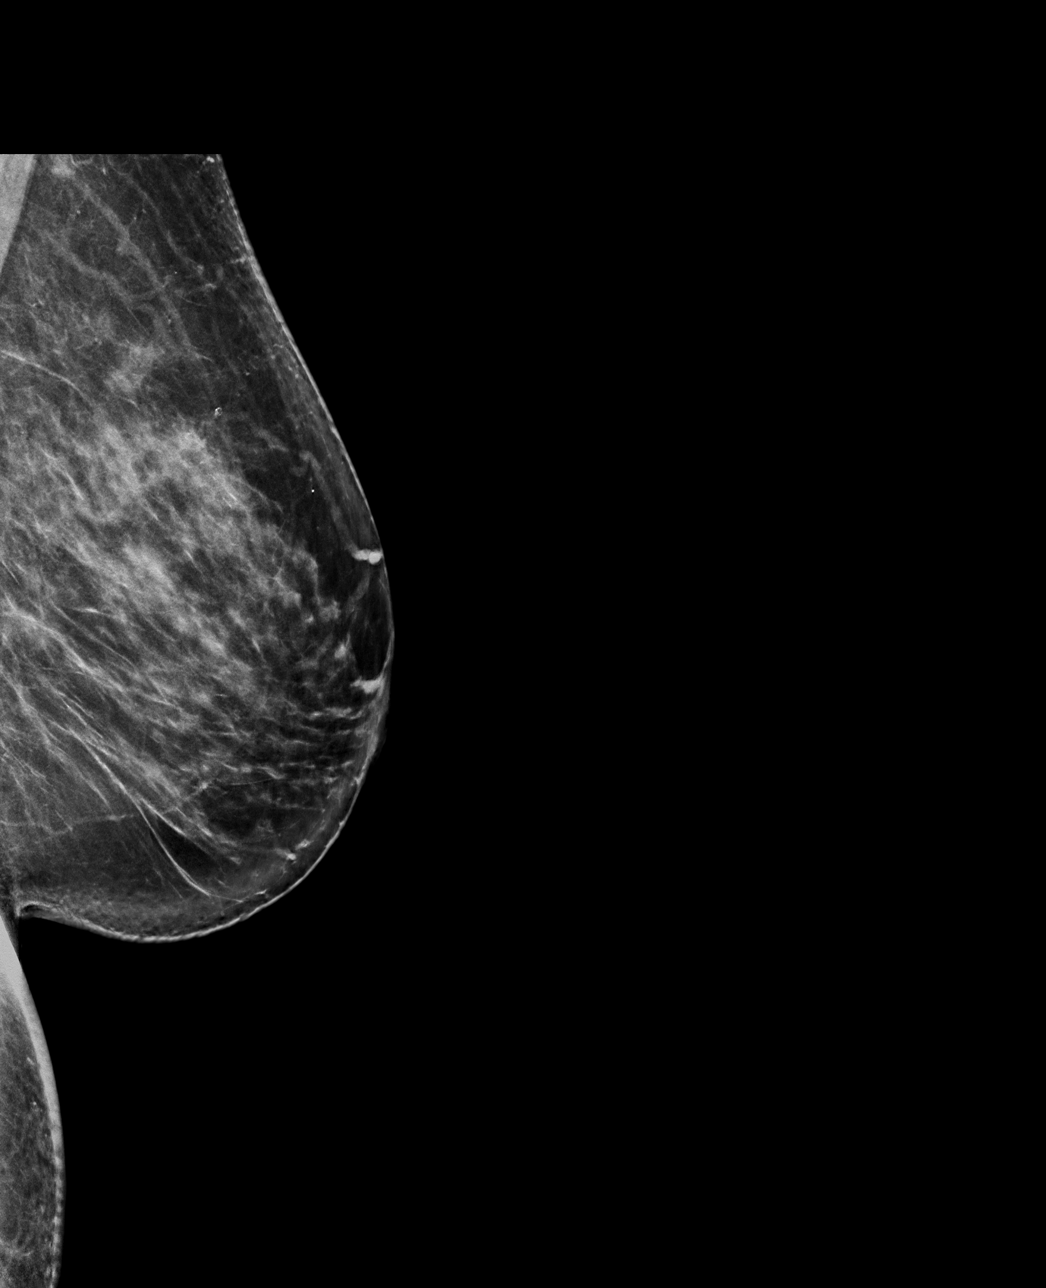

[L ML tomo · tomo slice 44/87.0]
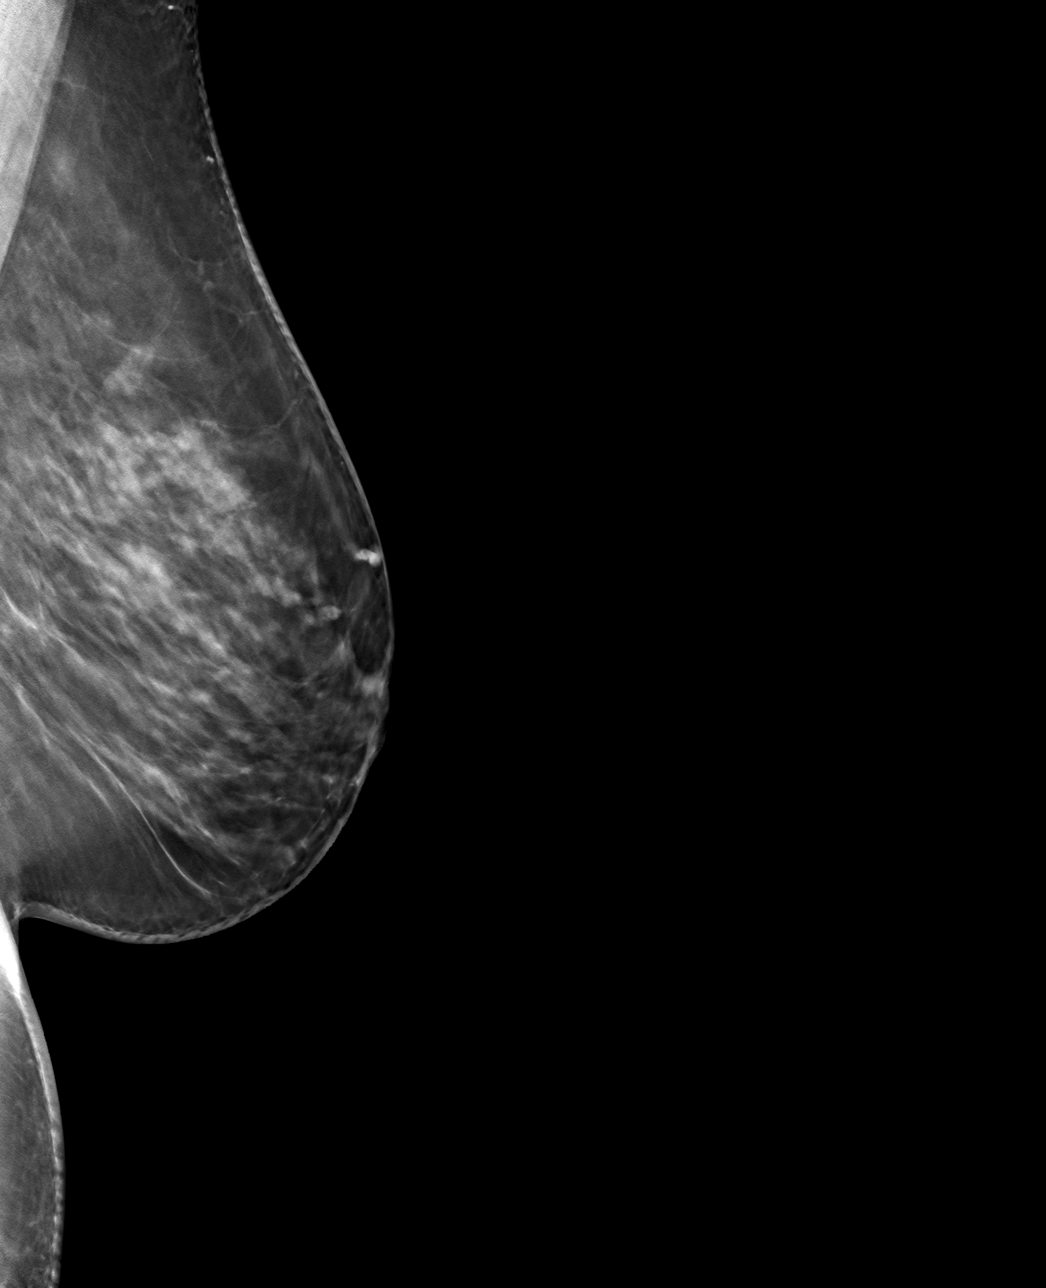

[L MLO tomo · tomo slice 37/72.0]
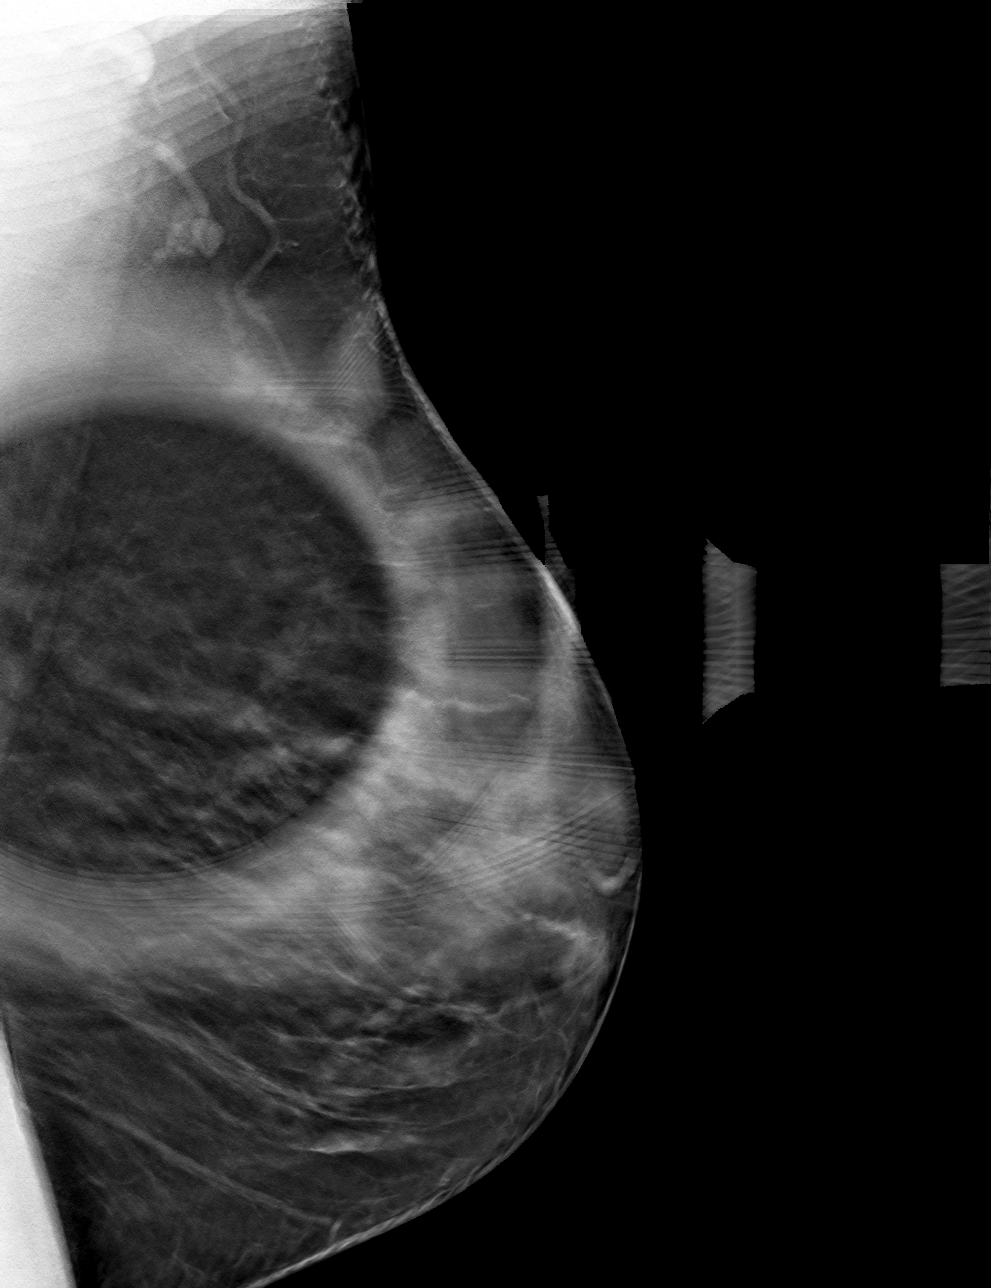

[4 of 12 positions shown; findings below may reference images not displayed]

ACR Breast Density Category c: The breast tissue is heterogeneously
dense, which may obscure small masses.
FINDINGS: Additional tomograms were performed of the left breast. There is no
persistent suspicious mass or abnormality seen in the left breast.
There is no mammographic evidence of malignancy in the left breast.

Mammographic images were processed with CAD.
IMPRESSION: No mammographic evidence of malignancy in the left breast.

RECOMMENDATION:
Screening mammogram in one year.(Code:[id number])

I have discussed the findings and recommendations with the patient.
Results were also provided in writing at the conclusion of the
visit. If applicable, a reminder letter will be sent to the patient
regarding the next appointment.

BI-RADS CATEGORY  1: Negative.

## 2019-09-12 ENCOUNTER — Other Ambulatory Visit: Payer: Self-pay | Admitting: Internal Medicine

## 2019-09-12 DIAGNOSIS — E781 Pure hyperglyceridemia: Secondary | ICD-10-CM

## 2019-09-12 DIAGNOSIS — I10 Essential (primary) hypertension: Secondary | ICD-10-CM

## 2019-09-15 NOTE — Telephone Encounter (Signed)
Refills approved.

## 2019-10-02 ENCOUNTER — Other Ambulatory Visit: Payer: Self-pay

## 2019-10-02 DIAGNOSIS — Z1231 Encounter for screening mammogram for malignant neoplasm of breast: Secondary | ICD-10-CM

## 2019-10-12 ENCOUNTER — Other Ambulatory Visit: Payer: Self-pay | Admitting: Internal Medicine

## 2019-10-12 DIAGNOSIS — I10 Essential (primary) hypertension: Secondary | ICD-10-CM

## 2019-10-12 DIAGNOSIS — E781 Pure hyperglyceridemia: Secondary | ICD-10-CM

## 2019-11-02 ENCOUNTER — Other Ambulatory Visit: Payer: Self-pay

## 2019-11-02 ENCOUNTER — Ambulatory Visit
Admission: RE | Admit: 2019-11-02 | Discharge: 2019-11-02 | Disposition: A | Discharge status: Home / Self Care | Payer: No Typology Code available for payment source | Source: Ambulatory Visit | Attending: Obstetrics and Gynecology | Admitting: Obstetrics and Gynecology

## 2019-11-02 ENCOUNTER — Ambulatory Visit: Payer: Self-pay | Admitting: Student

## 2019-11-02 ENCOUNTER — Encounter: Payer: Self-pay | Admitting: Student

## 2019-11-02 ENCOUNTER — Encounter (INDEPENDENT_AMBULATORY_CARE_PROVIDER_SITE_OTHER): Payer: Self-pay

## 2019-11-02 VITALS — BP 146/86 | Temp 97.8°F | Wt 195.0 lb

## 2019-11-02 DIAGNOSIS — Z1239 Encounter for other screening for malignant neoplasm of breast: Secondary | ICD-10-CM

## 2019-11-02 DIAGNOSIS — Z1231 Encounter for screening mammogram for malignant neoplasm of breast: Secondary | ICD-10-CM

## 2019-11-02 NOTE — Progress Notes (Signed)
[name] is a 64 y.o. female who presents to [facility] clinic today with no complaints.    Pap Smear: Pap not smear completed today. Last Pap smear was 12/03/2016 at [facility] and was normal. Per patient has no history of an abnormal Pap smear. Last Pap smear result is available in Epic. Had a hysterectomy in 1999 due to fibroids. Unclear if total hysterectomy. Per previous pap smear note & exam, cervix was unable to be located.    Physical exam: Breasts Breasts symmetrical. No skin abnormalities bilateral breasts. No nipple retraction bilateral breasts. No nipple discharge bilateral breasts. No lymphadenopathy. No lumps palpated bilateral breasts.       Pelvic/Bimanual Pap is not indicated today    Smoking History: Patient has is a former smoker. Quit in 2009.    Patient Navigation: Patient education provided. Access to services provided for patient through [place] program.    Colorectal Cancer Screening: Per patient had negative FIT testing last year. No complaints today.    Breast and Cervical Cancer Risk Assessment: Patient does not have family history of breast cancer, known genetic mutations, or radiation treatment to the chest before age 55. Patient does not have history of cervical dysplasia, immunocompromised, or DES exposure in-utero.  Risk Assessment    Risk Scores      11/02/2019 10/11/2018   Last edited by: Demetrius Revel, LPN Rolena Infante H, LPN   5-year risk: 1.5 % 1.4 %   Lifetime risk: 6.7 % 6.9 %          A: [facility] exam without pap smear   P: Referred patient to the [facility] for a screening mammogram. Appointment scheduled today.  [name], NP 11/02/2019 9:01 AM

## 2019-11-13 ENCOUNTER — Other Ambulatory Visit: Payer: Self-pay | Admitting: Internal Medicine

## 2019-11-13 DIAGNOSIS — I10 Essential (primary) hypertension: Secondary | ICD-10-CM

## 2019-11-13 DIAGNOSIS — E781 Pure hyperglyceridemia: Secondary | ICD-10-CM

## 2019-11-24 ENCOUNTER — Encounter: Payer: Self-pay | Admitting: *Deleted

## 2019-12-13 ENCOUNTER — Other Ambulatory Visit: Payer: Self-pay | Admitting: Internal Medicine

## 2019-12-13 DIAGNOSIS — I10 Essential (primary) hypertension: Secondary | ICD-10-CM

## 2019-12-13 DIAGNOSIS — E781 Pure hyperglyceridemia: Secondary | ICD-10-CM

## 2019-12-13 NOTE — Telephone Encounter (Signed)
Next appt scheduled tomorrow w/PCP.

## 2019-12-14 ENCOUNTER — Other Ambulatory Visit: Payer: Self-pay

## 2019-12-14 ENCOUNTER — Encounter: Payer: Self-pay | Admitting: Internal Medicine

## 2019-12-14 ENCOUNTER — Ambulatory Visit: Payer: Self-pay | Admitting: Internal Medicine

## 2019-12-14 VITALS — BP 146/61 | HR 99 | Temp 98.6°F | Ht 66.0 in | Wt 197.0 lb

## 2019-12-14 DIAGNOSIS — L03031 Cellulitis of right toe: Secondary | ICD-10-CM

## 2019-12-14 DIAGNOSIS — M109 Gout, unspecified: Secondary | ICD-10-CM

## 2019-12-14 DIAGNOSIS — E871 Hypo-osmolality and hyponatremia: Secondary | ICD-10-CM

## 2019-12-14 DIAGNOSIS — Z1211 Encounter for screening for malignant neoplasm of colon: Secondary | ICD-10-CM

## 2019-12-14 DIAGNOSIS — M1A09X0 Idiopathic chronic gout, multiple sites, without tophus (tophi): Secondary | ICD-10-CM

## 2019-12-14 DIAGNOSIS — I10 Essential (primary) hypertension: Secondary | ICD-10-CM

## 2019-12-14 DIAGNOSIS — Z0000 Encounter for general adult medical examination without abnormal findings: Secondary | ICD-10-CM

## 2019-12-14 MED ORDER — ALLOPURINOL 100 MG PO TABS: 100.0000 mg | ORAL_TABLET | Freq: Every day | ORAL | 3 refills | Status: DC

## 2019-12-14 NOTE — Progress Notes (Signed)
   CC: Hypertension, Paronychia, Gout, Preventative Health Care  HPI:  [name] is a 64 y.o. F with PMHx listed below presenting for Hypertension, Paronychia, Gout, Preventative Health Care. Please see the A&P for the status of the patient's chronic medical problems.  Past Medical History:  Diagnosis Date  . ANEMIA, NORMOCYTIC 06/14/2006  . Anxiety   . Bartholin cyst 06/2003  . 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 Gout   . Hemorrhoids 12/03/2016  . Hypertension   . Ingrown hair 04/13/2019  . 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 LIPOMA 08/19/2006   Adjacent to proximal right biceps tendon MRI 09/06    . Normocytic anemia    Unclear etiology. Anemia panel in 03/2010 showing Iron 53 , TIBC 303 , iron saturation percentage 17, ferritin 197 , B12 and folate within normal limits.  . Paronychia of toe, right 11/03/2018  . Postmenopausal status   . Tobacco abuse    Quit 2009, after approximately 15 pack year smoking history.  Marland Kitchen Umbilical hernia    S/P repair approximately 1995  . Urine abnormality 05/13/2017   Review of Systems:  Performed and all others negative.  Physical Exam:  Vitals:   12/14/19 1410  BP: (!) 146/61  Pulse: 99  Temp: 98.6 F (37 C)  TempSrc: Oral  SpO2: 100%  Weight: 197 lb (89.4 kg)  Height: 5\' 6"  (1.676 m)   Physical Exam Constitutional:      General: She is not in acute distress.    Appearance: Normal appearance.  Cardiovascular:     Rate and Rhythm: Normal rate and regular rhythm.     Pulses: Normal pulses.     Heart sounds: Murmur present.  Pulmonary:     Effort: Pulmonary effort is normal. No respiratory distress.     Breath sounds: Normal breath sounds.  Abdominal:     General: Bowel sounds are normal. There is no distension.     Palpations: Abdomen is soft.     Tenderness: There is no abdominal tenderness.  Musculoskeletal:     Right lower leg: No edema.     Left lower leg: No edema.  Skin:  General: Skin is warm and dry.  Neurological:     General: No focal deficit present.     Mental Status: Mental status is at baseline.    Assessment & Plan:   See Encounters Tab for problem based charting.  Patient discussed with Dr. Evette Doffing

## 2019-12-14 NOTE — Patient Instructions (Signed)
Thank you for allowing [place] to care for you  Continue current medications for now - Blood pressure medication changes will be discussed at follow up  Restart Allopurinol Daily - Take Aleve twice a day for 3 months with this  Colon cancer screening card ordered  Follow up in about 3 months.

## 2019-12-15 LAB — BMP8+ANION GAP
Anion Gap: 19 mmol/L — ABNORMAL HIGH (ref 10.0–18.0)
BUN/Creatinine Ratio: 23 (ref 12–28)
BUN: 22 mg/dL (ref 8–27)
CO2: 18 mmol/L — ABNORMAL LOW (ref 20–29)
Calcium: 9.7 mg/dL (ref 8.7–10.3)
Chloride: 94 mmol/L — ABNORMAL LOW (ref 96–106)
Creatinine, Ser: 0.97 mg/dL (ref 0.57–1.00)
GFR calc Af Amer: 73 mL/min/{1.73_m2} (ref >59)
GFR calc non Af Amer: 63 mL/min/{1.73_m2} (ref >59)
Glucose: 96 mg/dL (ref 65–99)
Potassium: 3.8 mmol/L (ref 3.5–5.2)
Sodium: 131 mmol/L — ABNORMAL LOW (ref 134–144)

## 2019-12-17 ENCOUNTER — Encounter: Payer: Self-pay | Admitting: Internal Medicine

## 2019-12-17 NOTE — Assessment & Plan Note (Signed)
Patient is following with podiatry and states her toe is much improved. Foot not examined today. - Continue follow up with podiatry.

## 2019-12-17 NOTE — Assessment & Plan Note (Signed)
Patient has known history of gout with flair last year. Flair has resolved. She has not been taking her allopurinol, which may be more important than usual given recent flair and continued HCTZ use. Encouraged resumption of allopurinol and to take her Naproxen BID for three months to prevent flair while starting allopurinol.  - Allopurinol 100mg  Daily - Naproxen BID for 3 months

## 2019-12-17 NOTE — Assessment & Plan Note (Addendum)
BP elevated today to 148/61. We discussed alterations to her regimen; including the posibility of adding spironolactone and weaning of HCTZ as able (give previous gout flair). Unable to add CCB due to prior reaction. She would like to wait until after she renew her orange card before making changes. Will check BMP as last Cr was elevated to 1.3 from .86 and will need to establish baseline if medications are changed. - Losartan 100mg  Daily - HCTZ 25mg  Daily - BMP: Cr returned to normal at 0.97 - Follow up after orange card renewal for recheck and possible medicaiton adjustment  ADDENDUM: BMP showed renal function has returned to normal. Mildly low Na also noted. Patient is taking HCTZ, but was unable to make a change at last visit. Will bring her in for a lab visit to recheck Na to ensure it is stable/improving. Spoke with her by phone she we speak with her friend who drives her and call our office for an appointment this week. She is advised to seek medical attention if she develops any new symptoms. - Recheck BMP/Na this week - Discontinue HCTZ and switch to spironolactone if Na decreasing or not improving.  ADDENDUM: Na returned to normal on repeat labs, patient updated by phone.

## 2019-12-17 NOTE — Assessment & Plan Note (Signed)
-   Colon cancer screening with FOBT Card ordered

## 2019-12-18 ENCOUNTER — Encounter: Payer: Self-pay | Admitting: Internal Medicine

## 2019-12-18 NOTE — Addendum Note (Signed)
Addended by: [name] B on: 12/18/2019 12:46 PM   Modules accepted: Orders

## 2019-12-18 NOTE — Progress Notes (Signed)
Internal Medicine Clinic Attending  Case discussed with Dr. Melvin  at the time of the visit.  We reviewed the resident's history and exam and pertinent patient test results.  I agree with the assessment, diagnosis, and plan of care documented in the resident's note.  

## 2019-12-18 NOTE — Progress Notes (Signed)
Letter not sent, patient called.

## 2019-12-20 ENCOUNTER — Other Ambulatory Visit (INDEPENDENT_AMBULATORY_CARE_PROVIDER_SITE_OTHER): Payer: Self-pay

## 2019-12-20 DIAGNOSIS — Z1211 Encounter for screening for malignant neoplasm of colon: Secondary | ICD-10-CM

## 2019-12-20 DIAGNOSIS — E871 Hypo-osmolality and hyponatremia: Secondary | ICD-10-CM

## 2019-12-20 DIAGNOSIS — I10 Essential (primary) hypertension: Secondary | ICD-10-CM

## 2019-12-21 LAB — BMP8+ANION GAP
Anion Gap: 17 mmol/L (ref 10.0–18.0)
BUN/Creatinine Ratio: 20 (ref 12–28)
BUN: 17 mg/dL (ref 8–27)
CO2: 18 mmol/L — ABNORMAL LOW (ref 20–29)
Calcium: 9.7 mg/dL (ref 8.7–10.3)
Chloride: 100 mmol/L (ref 96–106)
Creatinine, Ser: 0.84 mg/dL (ref 0.57–1.00)
GFR calc Af Amer: 87 mL/min/{1.73_m2} (ref >59)
GFR calc non Af Amer: 75 mL/min/{1.73_m2} (ref >59)
Glucose: 91 mg/dL (ref 65–99)
Potassium: 4 mmol/L (ref 3.5–5.2)
Sodium: 135 mmol/L (ref 134–144)

## 2019-12-21 LAB — FECAL OCCULT BLOOD, IMMUNOCHEMICAL: Fecal Occult Bld: NEGATIVE

## 2019-12-26 ENCOUNTER — Encounter: Payer: Self-pay | Admitting: Internal Medicine

## 2019-12-26 NOTE — Progress Notes (Signed)
Results of normal study will be mailed to patient.

## 2020-01-08 ENCOUNTER — Ambulatory Visit: Payer: Self-pay

## 2020-03-18 ENCOUNTER — Other Ambulatory Visit: Payer: Self-pay | Admitting: *Deleted

## 2020-03-18 DIAGNOSIS — E781 Pure hyperglyceridemia: Secondary | ICD-10-CM

## 2020-03-18 DIAGNOSIS — I10 Essential (primary) hypertension: Secondary | ICD-10-CM

## 2020-03-18 MED ORDER — LOSARTAN POTASSIUM 100 MG PO TABS: 100.0000 mg | ORAL_TABLET | Freq: Every day | ORAL | 5 refills | Status: DC

## 2020-03-18 MED ORDER — HYDROCHLOROTHIAZIDE 25 MG PO TABS: 25.0000 mg | ORAL_TABLET | Freq: Every day | ORAL | 5 refills | Status: DC

## 2020-03-18 MED ORDER — SIMVASTATIN 40 MG PO TABS: 40.0000 mg | ORAL_TABLET | Freq: Every day | ORAL | 5 refills | Status: DC

## 2020-03-18 NOTE — Telephone Encounter (Signed)
Called patient on the phone. States that she is feeling well. Reports BP readings of 130s/70s at home and states that it is typically elevated here in the office. Also has renewed her orange card. She is agreeable to f/u visit in about 3 months. I asked her to keep a BP log in the meantime. Will refill her medications.

## 2020-03-25 ENCOUNTER — Encounter: Payer: Self-pay | Admitting: *Deleted

## 2020-05-27 ENCOUNTER — Encounter: Payer: No Typology Code available for payment source | Admitting: Student

## 2020-05-27 NOTE — Progress Notes (Signed)
   CC: gout, HTN    HPI:  Jenna May is a 64 y.o. female with history as below presenting for follow up on gout and HTN. Please refer to problem based charting for further details of assessment and plan of current problem and chronic medical conditions.  Past Medical History:  Diagnosis Date  . ANEMIA, NORMOCYTIC 06/14/2006  . Anxiety   . Bartholin cyst 06/2003  . 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 Gout   . Hemorrhoids 12/03/2016  . Hypertension   . Ingrown hair 04/13/2019  . 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 LIPOMA 08/19/2006   Adjacent to proximal right biceps tendon MRI 09/06    . Normocytic anemia    Unclear etiology. Anemia panel in 03/2010 showing Iron 53 , TIBC 303 , iron saturation percentage 17, ferritin 197 , B12 and folate within normal limits.  . Paronychia of toe, right 11/03/2018  . Postmenopausal status   . Tobacco abuse    Quit 2009, after approximately 15 pack year smoking history.  Marland Kitchen Umbilical hernia    S/P repair approximately 1995  . Urine abnormality 05/13/2017   Review of Systems:   Review of Systems  Constitutional: Negative for chills and fever.  Respiratory: Negative for cough and shortness of breath.   Cardiovascular: Negative for chest pain and palpitations.  Gastrointestinal: Positive for constipation. Negative for abdominal pain, nausea and vomiting.  Musculoskeletal: Positive for joint pain.  Neurological: Negative for dizziness, loss of consciousness and headaches.  All other systems reviewed and are negative.   Physical Exam: Vitals:   05/28/20 1324  BP: (!) 150/76  Pulse: 95  Temp: 98.4 F (36.9 C)  TempSrc: Oral  SpO2: 100%   Constitutional: no acute distress Head: atraumatic ENT: external ears normal Cardiovascular: regular rate and rhythm, normal heart sounds Pulmonary: effort normal, normal breath sounds bilaterally Abdominal: flat, nontender, no rebound tenderness, bowel sounds  normal Musculoskeletal: erythema, edema, warmth, and mild tenderness to the L lateral malleolus. Some pain with movement of the joint Skin: warm and dry Neurological: alert, no focal deficit Psychiatric: normal mood and affect  Assessment & Plan:   See Encounters Tab for problem based charting.  Patient seen with Dr. Heber Prague

## 2020-05-28 ENCOUNTER — Ambulatory Visit (INDEPENDENT_AMBULATORY_CARE_PROVIDER_SITE_OTHER): Payer: Self-pay | Admitting: Student

## 2020-05-28 ENCOUNTER — Other Ambulatory Visit: Payer: Self-pay

## 2020-05-28 ENCOUNTER — Encounter: Payer: Self-pay | Admitting: Student

## 2020-05-28 DIAGNOSIS — I10 Essential (primary) hypertension: Secondary | ICD-10-CM

## 2020-05-28 DIAGNOSIS — Z0000 Encounter for general adult medical examination without abnormal findings: Secondary | ICD-10-CM

## 2020-05-28 DIAGNOSIS — M1A09X0 Idiopathic chronic gout, multiple sites, without tophus (tophi): Secondary | ICD-10-CM

## 2020-05-28 MED ORDER — OLMESARTAN MEDOXOMIL 40 MG PO TABS: 40.0000 mg | ORAL_TABLET | Freq: Every day | ORAL | 5 refills | Status: DC

## 2020-05-28 MED ORDER — NAPROXEN 250 MG PO TABS: 250.0000 mg | ORAL_TABLET | Freq: Three times a day (TID) | ORAL | 1 refills | Status: DC | PRN

## 2020-05-28 NOTE — Addendum Note (Signed)
Addended by: Andrew Au on: 05/28/2020 03:39 PM   Modules accepted: Orders

## 2020-05-28 NOTE — Assessment & Plan Note (Addendum)
Was prescribed allopurinol, but takes it sparingly because "it doesn't agree with me" and also makes her constipated. She is vague about the side effects and unable to specify further. She does have a current gout flare in her L ankle. On exam it is mildly tender, edematous, erythematous, warm, and has some pain with movement of the joint. States it started a week ago and is improving now. Last flare was over a year ago. Has not been taken the naproxen that she was prescribed.  -discontinue allopurinol -re-prescribe naproxen with clear instructions to use with flares -she declines steroids -can consider probenecid, but hold off for now due to infrequent flares

## 2020-05-28 NOTE — Patient Instructions (Signed)
Thank you for allowing [place] to be a part of your care today, it was a pleasure seeing you. We discussed your gout and hypertension.  I am checking these labs: none  I have made these changes to your medications: STOP hctz STOP losartan STOP allopurinol START olmesartan 40mg  once a day START Naproxen 250mg , Take 3 tablets initially when your gout flare starts. Then take 1 tablet every 8 hours as needed.  The olmesartan should lower your blood pressure a little more than it is now. Stopping the hctz should reduce your gout flares Finally, use the naproxen as above for future gout flares.  Please follow up in 3 months   Thank you, and please call the Internal Medicine Clinic at [phone] if you have any questions.  Best, Dr. Bridgett Larsson

## 2020-05-28 NOTE — Assessment & Plan Note (Signed)
-  flu vaccine today -FOBT negative this past May

## 2020-05-28 NOTE — Assessment & Plan Note (Addendum)
BP of 150/76 today. Home BP log better but still elevated, 120-140s/70-80. BP elevated to 148/61 at last visited but held off on medication changes due to lack of [employer], which has since been acquired. Unable to add CCB due to prior reaction. Benign BMP at last visit in May. Switching to olmesartan which is more potent than losartan.  -stop hctz due to gout flares -stop losartan -start olmesartan 40mg  daily  -f/u for BP check in 3 months  Addendum: olmesartan not available at the [facility]. Sent to [facility] along with GoodRx coupon, should be 19.48 which she states is affordable for her.

## 2020-06-04 NOTE — Progress Notes (Signed)
Internal Medicine Clinic Attending  I saw and evaluated the patient.  I personally confirmed the key portions of the history and exam documented by Dr. Chen and I reviewed pertinent patient test results.  The assessment, diagnosis, and plan were formulated together and I agree with the documentation in the resident's note.  

## 2020-07-10 ENCOUNTER — Ambulatory Visit: Payer: No Typology Code available for payment source

## 2020-09-09 ENCOUNTER — Other Ambulatory Visit: Payer: Self-pay | Admitting: Student

## 2020-09-09 DIAGNOSIS — E781 Pure hyperglyceridemia: Secondary | ICD-10-CM

## 2020-11-21 ENCOUNTER — Other Ambulatory Visit: Payer: Self-pay | Admitting: Student

## 2020-11-21 DIAGNOSIS — I10 Essential (primary) hypertension: Secondary | ICD-10-CM

## 2020-11-21 NOTE — Telephone Encounter (Signed)
Next appt scheduled  5/2/with PCP. 

## 2020-11-23 NOTE — Progress Notes (Signed)
   CC: Tachycardia, follow-up on hypertension, transaminitis, preventive care  HPI:  Jenna May is a 65 y.o. female with history as below presenting for the above. Please refer to problem based charting for further details of assessment and plan of current problem and chronic medical conditions.  Past Medical History:  Diagnosis Date  . ANEMIA, NORMOCYTIC 06/14/2006  . Anxiety   . Bartholin cyst 06/2003  . 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 Gout   . Hemorrhoids 12/03/2016  . Hypertension   . Ingrown hair 04/13/2019  . 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 LIPOMA 08/19/2006   Adjacent to proximal right biceps tendon MRI 09/06    . Normocytic anemia    Unclear etiology. Anemia panel in 03/2010 showing Iron 53 , TIBC 303 , iron saturation percentage 17, ferritin 197 , B12 and folate within normal limits.  . Paronychia of toe, right 11/03/2018  . Postmenopausal status   . Tobacco abuse    Quit 2009, after approximately 15 pack year smoking history.  Marland Kitchen Umbilical hernia    S/P repair approximately 1995  . Urine abnormality 05/13/2017   Review of Systems:   Review of Systems  Constitutional: Positive for weight loss. Negative for chills, diaphoresis, fever and malaise/fatigue.  HENT: Positive for nosebleeds. Negative for sore throat.   Respiratory: Negative for cough and shortness of breath.   Cardiovascular: Positive for palpitations. Negative for chest pain and leg swelling.  Neurological: Negative for dizziness.  All other systems reviewed and are negative.    Physical Exam: Vitals:   11/25/20 0855 11/25/20 0902  BP: (!) 153/86 (!) 162/80  Pulse: (!) 106 99  Temp: 99.7 F (37.6 C)   TempSrc: Oral   SpO2: 100%   Weight: 188 lb 8 oz (85.5 kg)   Height: 5\' 6"  (1.676 m)    Constitutional: no acute distress Head: atraumatic ENT: external ears normal Cardiovascular: regular rate and rhythm, normal heart sounds Pulmonary: effort normal,  normal breath sounds bilaterally Abdominal: flat Skin: warm and dry Neurological: alert, no focal deficit Psychiatric: normal mood and affect  Assessment & Plan:   See Encounters Tab for problem based charting.  Patient discussed with Dr. Dareen Piano

## 2020-11-25 ENCOUNTER — Encounter: Payer: Self-pay | Admitting: Student

## 2020-11-25 ENCOUNTER — Ambulatory Visit (HOSPITAL_COMMUNITY)
Admission: RE | Admit: 2020-11-25 | Discharge: 2020-11-25 | Disposition: A | Discharge status: Home / Self Care | Payer: Self-pay | Source: Ambulatory Visit | Attending: Internal Medicine | Admitting: Internal Medicine

## 2020-11-25 ENCOUNTER — Ambulatory Visit: Payer: Self-pay | Admitting: Student

## 2020-11-25 ENCOUNTER — Other Ambulatory Visit: Payer: Self-pay

## 2020-11-25 VITALS — BP 162/80 | HR 99 | Temp 99.7°F | Ht 66.0 in | Wt 188.5 lb

## 2020-11-25 DIAGNOSIS — Z299 Encounter for prophylactic measures, unspecified: Secondary | ICD-10-CM

## 2020-11-25 DIAGNOSIS — E781 Pure hyperglyceridemia: Secondary | ICD-10-CM

## 2020-11-25 DIAGNOSIS — R7401 Elevation of levels of liver transaminase levels: Secondary | ICD-10-CM

## 2020-11-25 DIAGNOSIS — R748 Abnormal levels of other serum enzymes: Secondary | ICD-10-CM

## 2020-11-25 DIAGNOSIS — R000 Tachycardia, unspecified: Secondary | ICD-10-CM | POA: Insufficient documentation

## 2020-11-25 DIAGNOSIS — Z0000 Encounter for general adult medical examination without abnormal findings: Secondary | ICD-10-CM

## 2020-11-25 DIAGNOSIS — Z6830 Body mass index (BMI) 30.0-30.9, adult: Secondary | ICD-10-CM

## 2020-11-25 DIAGNOSIS — Z87898 Personal history of other specified conditions: Secondary | ICD-10-CM

## 2020-11-25 DIAGNOSIS — I10 Essential (primary) hypertension: Secondary | ICD-10-CM

## 2020-11-25 DIAGNOSIS — Z23 Encounter for immunization: Secondary | ICD-10-CM

## 2020-11-25 DIAGNOSIS — E669 Obesity, unspecified: Secondary | ICD-10-CM

## 2020-11-25 LAB — GLUCOSE, CAPILLARY: Glucose-Capillary: 110 mg/dL — ABNORMAL HIGH (ref 70–99)

## 2020-11-25 LAB — POCT GLYCOSYLATED HEMOGLOBIN (HGB A1C): Hemoglobin A1C: 5.1 % (ref 4.0–5.6)

## 2020-11-25 MED ORDER — OLMESARTAN MEDOXOMIL 40 MG PO TABS: 40.0000 mg | ORAL_TABLET | Freq: Every day | ORAL | 11 refills | Status: DC

## 2020-11-25 MED ORDER — METOPROLOL SUCCINATE ER 25 MG PO TB24: 25.0000 mg | ORAL_TABLET | Freq: Every day | ORAL | 2 refills | Status: DC

## 2020-11-25 MED ORDER — SIMVASTATIN 40 MG PO TABS: 40.0000 mg | ORAL_TABLET | Freq: Every day | ORAL | 11 refills | Status: DC

## 2020-11-25 NOTE — Patient Instructions (Signed)
Thank you for allowing [place] to be a part of your care today, it was a pleasure seeing you. We discussed your hypertension, heart rate, cholesterol, and preventive health  I am checking these labs: Comprehensive metabolic panel, blood count, thyroid level, stool blood test  Your tetanus shot was administered  Mammogram has been ordered.  For your high heart rate, your EKG looked reassuring.  I am checking your thyroid function.  For your blood pressure, I am starting a new medication called Toprol.  This will be once a day.  Check your pressures after starting this, call us in about 1 week and we will discuss your blood pressures and may adjust the medication dose.  Please return your stool test after it collected it.  I have made these changes to your medications: Start Toprol 25 mg, once daily  Please follow up in 3 months   Thank you, and please call the Internal Medicine Clinic at [phone] if you have any questions.  Best, Dr. Bridgett Larsson

## 2020-11-25 NOTE — Assessment & Plan Note (Signed)
>>  ASSESSMENT AND PLAN FOR ELEVATED LIVER ENZYMES WRITTEN ON 11/25/2020 10:22 AM BY LAURENCE FONDA GRADE, MD  Mild but persistently elevated liver enzymes in the past. US  liver with doppler on 09/06/19 was benign. Negative vital hepatitis testing in the past.  May be related to simvastatin  use.  She has been on his medication for many years and likes it, prefers to remain on it if possible.  -Recheck CMP  - If worsening, consider changing simvastatin

## 2020-11-25 NOTE — Assessment & Plan Note (Addendum)
-   FOBT ordered - COVID-booster received in March - Tdap administered - Mammogram ordered - Normal A1c

## 2020-11-25 NOTE — Assessment & Plan Note (Addendum)
History of hypertriglyceridemia, last level was 823 in 2015.  Normal HDL and LDL.  Has been on simvastatin for milligrams daily for several years and tolerated well, without this medication.  Also taking fish oil daily.  Patient is self-pay and after discussing options, she wants to defer lipid panel for now.  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 10-year ASCVD risk of 3.7% so no requirement for statin therapy specifically, but has been unable to afford fenofibrate or gemfibrozil in the past. Etiology unclear, has been on simvastatin for several years.  She also notes that she drinks few glasses of alkaline water a day.  She has a glass of wine occasionally, up to 2/day at most.  AST  Date Value Ref Range Status  11/25/2020 155 (H) 0 - 40 IU/L Final  04/13/2019 31 0 - 40 IU/L Final  01/06/2019 43 (H) 0 - 40 IU/L Final   ALT  Date Value Ref Range Status  11/25/2020 139 (H) 0 - 32 IU/L Final  04/13/2019 44 (H) 0 - 32 IU/L Final  01/06/2019 45 (H) 0 - 32 IU/L Final   Alkaline Phosphatase  Date Value Ref Range Status  11/25/2020 179 (H) 44 - 121 IU/L Final  04/13/2019 113 39 - 117 IU/L Final  01/06/2019 97 39 - 117 IU/L Final    - Follow-up in a week or 2 for further evaluation, patient with point when she has a ride - Hold simvastatin

## 2020-11-25 NOTE — Addendum Note (Signed)
Addended by: Marcelino Duster on: 11/25/2020 04:30 PM   Modules accepted: Orders

## 2020-11-25 NOTE — Assessment & Plan Note (Addendum)
Patient with tachycardia and the 90s to 100s on this office visit.  Has had similar heart rate over the past 2 years.  No known heart disease.  EKG today showed sinus tachycardia.  Normal QT interval, sinus rhythm, no ST changes.  She does report some palpitations mostly at night.  On chart review had 10 pound weight loss in the past year.  Denies chest pain, recent illness, fever, chills.  Has a history of subclinical hypothyroidism.  Last TSH in 2019 was normal.  - TSH - CBC

## 2020-11-25 NOTE — Assessment & Plan Note (Addendum)
BP: (!) 162/80 She brought her home machine today to compare and it is a very similar reading to our clinic machine.  Takes olmesartan 40mg  daily.  Last labs 1 year ago and benign with normal potassium and creatinine.  Denies chest pain, dizziness, headaches, LOC.    -Continue olmesartan 40 mg daily - Check CMP for comorbid transaminitis - Avoid amlodipine and HCTZ due to prior issues - Start Toprol 25 mg daily - Follow-up in 3 months  Addendum: [facility] does not have Toprol on formulary, switch to [employer]

## 2020-11-25 NOTE — Assessment & Plan Note (Signed)
EKG today with QTC of 419.

## 2020-11-25 NOTE — Assessment & Plan Note (Addendum)
Mild but persistently elevated liver enzymes in the past. US liver with doppler on 09/06/19 was benign. Negative vital hepatitis testing in the past.  May be related to simvastatin use.  She has been on his medication for many years and likes it, prefers to remain on it if possible.  -Recheck CMP  - If worsening, consider changing simvastatin

## 2020-11-26 ENCOUNTER — Telehealth: Payer: Self-pay | Admitting: *Deleted

## 2020-11-26 LAB — CMP14 + ANION GAP
ALT: 139 IU/L — ABNORMAL HIGH (ref 0–32)
AST: 155 IU/L — ABNORMAL HIGH (ref 0–40)
Albumin/Globulin Ratio: 1.2 (ref 1.2–2.2)
Albumin: 4.3 g/dL (ref 3.8–4.8)
Alkaline Phosphatase: 179 IU/L — ABNORMAL HIGH (ref 44–121)
Anion Gap: 18 mmol/L (ref 10.0–18.0)
BUN/Creatinine Ratio: 24 (ref 12–28)
BUN: 25 mg/dL (ref 8–27)
Bilirubin Total: 0.3 mg/dL (ref 0.0–1.2)
CO2: 16 mmol/L — ABNORMAL LOW (ref 20–29)
Calcium: 9.4 mg/dL (ref 8.7–10.3)
Chloride: 101 mmol/L (ref 96–106)
Creatinine, Ser: 1.04 mg/dL — ABNORMAL HIGH (ref 0.57–1.00)
Globulin, Total: 3.7 g/dL (ref 1.5–4.5)
Glucose: 98 mg/dL (ref 65–99)
Potassium: 3.9 mmol/L (ref 3.5–5.2)
Sodium: 135 mmol/L (ref 134–144)
Total Protein: 8 g/dL (ref 6.0–8.5)
eGFR: 61 mL/min/{1.73_m2} (ref >59)

## 2020-11-26 LAB — CBC
Hematocrit: 35.5 % (ref 34.0–46.6)
Hemoglobin: 11.4 g/dL (ref 11.1–15.9)
MCH: 30.1 pg (ref 26.6–33.0)
MCHC: 32.1 g/dL (ref 31.5–35.7)
MCV: 94 fL (ref 79–97)
Platelets: 132 10*3/uL — ABNORMAL LOW (ref 150–450)
RBC: 3.79 x10E6/uL (ref 3.77–5.28)
RDW: 13.7 % (ref 11.7–15.4)
WBC: 6.9 10*3/uL (ref 3.4–10.8)

## 2020-11-26 LAB — TSH: TSH: 6.11 u[IU]/mL — ABNORMAL HIGH (ref 0.450–4.500)

## 2020-11-26 NOTE — Progress Notes (Signed)
Internal Medicine Clinic Attending  Case discussed with Dr. Chen  At the time of the visit.  We reviewed the resident's history and exam and pertinent patient test results.  I agree with the assessment, diagnosis, and plan of care documented in the resident's note. 

## 2020-11-26 NOTE — Telephone Encounter (Signed)
Butch Penny from [facility] called stating they carry the metoprolol tartrate but not the succinate XR. Please resend Rx if this is ok, otherwise will need to send Rx for XR to different pharmacy.

## 2020-11-27 ENCOUNTER — Other Ambulatory Visit: Payer: Self-pay

## 2020-11-27 DIAGNOSIS — Z299 Encounter for prophylactic measures, unspecified: Secondary | ICD-10-CM

## 2020-11-27 MED ORDER — METOPROLOL TARTRATE 50 MG PO TABS: 50.0000 mg | ORAL_TABLET | Freq: Two times a day (BID) | ORAL | 5 refills | Status: DC

## 2020-11-27 NOTE — Telephone Encounter (Signed)
Will re-send, thank you!

## 2020-11-27 NOTE — Addendum Note (Signed)
Addended by: Andrew Au on: 11/27/2020 09:46 AM   Modules accepted: Orders

## 2020-12-01 LAB — FECAL OCCULT BLOOD, IMMUNOCHEMICAL: Fecal Occult Bld: NEGATIVE

## 2020-12-04 ENCOUNTER — Ambulatory Visit: Payer: Self-pay | Admitting: Student

## 2020-12-04 ENCOUNTER — Encounter: Payer: Self-pay | Admitting: Student

## 2020-12-04 VITALS — BP 149/83 | HR 76 | Temp 98.8°F | Ht 66.0 in | Wt 192.2 lb

## 2020-12-04 DIAGNOSIS — K7460 Unspecified cirrhosis of liver: Secondary | ICD-10-CM

## 2020-12-04 DIAGNOSIS — R748 Abnormal levels of other serum enzymes: Secondary | ICD-10-CM

## 2020-12-04 NOTE — Progress Notes (Signed)
   CC: Transaminitis  HPI:  [name] is a 65 y.o. female with history as below presenting for follow-up on transaminitis. Please refer to problem based charting for further details of assessment and plan of current problem and chronic medical conditions.    Past Medical History:  Diagnosis Date  . ANEMIA, NORMOCYTIC 06/14/2006  . Anxiety   . Bartholin cyst 06/2003  . 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 Gout   . Hemorrhoids 12/03/2016  . History of prolonged Q-T interval on ECG 01/06/2019  . Hypertension   . Ingrown hair 04/13/2019  . 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 LIPOMA 08/19/2006   Adjacent to proximal right biceps tendon MRI 09/06    . Normocytic anemia    Unclear etiology. Anemia panel in 03/2010 showing Iron 53 , TIBC 303 , iron saturation percentage 17, ferritin 197 , B12 and folate within normal limits.  . Paronychia of toe, right 11/03/2018  . Postmenopausal status   . Subjective vision disturbance, left eye 03/17/2018  . Tobacco abuse    Quit 2009, after approximately 15 pack year smoking history.  Marland Kitchen Umbilical hernia    S/P repair approximately 1995  . Urine abnormality 05/13/2017   Review of Systems:   Review of Systems  Constitutional: Negative for chills, fever and weight loss.  Cardiovascular: Negative for chest pain and palpitations.  Gastrointestinal: Negative for abdominal pain, nausea and vomiting.  Skin: Negative for itching and rash.  Neurological: Negative for tremors and loss of consciousness.  All other systems reviewed and are negative.    Physical Exam: Vitals:   12/04/20 1504  BP: (!) 149/83  Pulse: 76  Temp: 98.8 F (37.1 C)  TempSrc: Oral  SpO2: 100%  Weight: 192 lb 3.2 oz (87.2 kg)  Height: 5\' 6"  (1.676 m)   Constitutional: no acute distress Head: atraumatic ENT: external ears normal Cardiovascular: regular rate and rhythm, normal heart sounds Pulmonary: effort normal, normal breath sounds  bilaterally Abdominal: Nontender, hepatomegaly measuring 8 cm below the costal margin which is nontender and without obvious nodularity, normal bowel sounds Skin: warm and dry Neurological: alert, no focal deficit Psychiatric: normal mood and affect  Point-of-care ultrasound demonstrates hepatomegaly, grossly normal appearing liver parenchyma, no masses seen, liver edge does not appear nodular.  Gallbladder noted to be enlarged without any stones or wall thickening, patient did not eat much earlier today. No splenomegaly.  Assessment & Plan:   See Encounters Tab for problem based charting.  Patient discussed with Dr. Evette May

## 2020-12-04 NOTE — Assessment & Plan Note (Addendum)
Patient had worsening of liver enzyme in a somewhat mixed but more so cholestatic pattern on CMP 1 week ago, AST of 155, ALT 139, alk phos 179. She had normal bilirubin though, so the alk phos may not necessarily reflect a hepatic etiology. Mild thrombocytopenia at 132.  Noted to be taking simvastatin 40 mg for many years, but likely not due to this.  She has had intermittent transaminitis for a few years though never this high and alk phos has always been normal. US liver with doppler on 09/06/19 was benign. Negative vital hepatitis testing in 2016. Normal A1c.  On exam, she has gross hepatomegaly measured at 8 cm below the costal margin.  Point-of-care ultrasound demonstrates hepatomegaly, grossly normal appearing liver parenchyma, no masses seen, liver edge does not appear nodular.  Gallbladder noted to be enlarged without any stones or wall thickening, patient did not eat much earlier today.  She drinks 1-2 glasses of wine per day.  Denies binge drinking or history of binge drinking.  No significant Tylenol use. No skin changes, shortness of breath, abdominal pain, change in exercise tolerance. 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 On exam there are no spider nevi, palmar erythema, caput medusae, ascites, contractures, lymphadenopathy, JVD.  In summary, patient with transaminitis and a mostly hepatocellular pattern but noted to have elevated alk phos and enlarged gallbladder.  Mild thrombocytopenia at 132.  Grossly enlarged liver measuring 8 cm below the costal margin.  No exam findings of the stigmata of cirrhosis.  - Follow-up CMP, GGT - Follow-up hepatitis C, hepatitis B testing - Follow-up ferritin, transferrin saturation - Follow-up ANA, antimitochondrial antibody, anti-smooth muscle antibody - Referral to gastroenterology

## 2020-12-04 NOTE — Patient Instructions (Signed)
Thank you for allowing [place] to be a part of your care today, it was a pleasure seeing you. We discussed your liver issue  I am checking these labs: Liver function tests, bowel hepatitis testing, iron labs, panel of other causes of hepatitis  I will call you with results of these labs.  From a population perspective, the most common cause of liver dysfunction in our [place] is alcohol use and being overweight.  Try to stop your alcohol use, and try to lose weight.  Even 10 pounds can be very impactful.  I have made these changes to your medications: Stop pravastatin  I have placed a referral to gastroenterology.  Please follow up in 6 months   Thank you, and please call the Internal Medicine Clinic at [phone] if you have any questions.  Best, Dr. Bridgett Larsson

## 2020-12-04 NOTE — Assessment & Plan Note (Signed)
>>  ASSESSMENT AND PLAN FOR ELEVATED LIVER ENZYMES WRITTEN ON 12/04/2020  5:02 PM BY LAURENCE FONDA GRADE, MD  Patient had worsening of liver enzyme in a somewhat mixed but more so cholestatic pattern on CMP 1 week ago, AST of 155, ALT 139, alk phos 179. She had normal bilirubin though, so the alk phos may not necessarily reflect a hepatic etiology. Mild thrombocytopenia at 132.  Noted to be taking simvastatin  40 mg for many years, but likely not due to this.  She has had intermittent transaminitis for a few years though never this high and alk phos has always been normal. US  liver with doppler on 09/06/19 was benign. Negative vital hepatitis testing in 2016. Normal A1c.  On exam, she has gross hepatomegaly measured at 8 cm below the costal margin.  Point-of-care ultrasound demonstrates hepatomegaly, grossly normal appearing liver parenchyma, no masses seen, liver edge does not appear nodular.  Gallbladder noted to be enlarged without any stones or wall thickening, patient did not eat much earlier today.  She drinks 1-2 glasses of wine per day.  Denies binge drinking or history of binge drinking.  No significant Tylenol  use. No skin changes, shortness of breath, abdominal pain, change in exercise tolerance. 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 On exam there are no spider nevi, palmar erythema, caput medusae, ascites, contractures, lymphadenopathy, JVD.  In summary, patient with transaminitis and a mostly hepatocellular pattern but noted to have elevated alk phos and enlarged gallbladder.  Mild thrombocytopenia at 132.  Grossly enlarged liver measuring 8 cm below the costal margin.  No exam findings of the stigmata of cirrhosis.  - Follow-up CMP, GGT - Follow-up hepatitis C, hepatitis B testing - Follow-up ferritin, transferrin saturation - Follow-up ANA, antimitochondrial antibody, anti-smooth muscle antibody - Referral to  gastroenterology

## 2020-12-05 LAB — HEPATITIS C ANTIBODY: Hep C Virus Ab: <0.1 s/co ratio (ref 0.0–0.9)

## 2020-12-05 LAB — CMP14 + ANION GAP
ALT: 70 IU/L — ABNORMAL HIGH (ref 0–32)
AST: 78 IU/L — ABNORMAL HIGH (ref 0–40)
Albumin/Globulin Ratio: 1.3 (ref 1.2–2.2)
Albumin: 4.7 g/dL (ref 3.8–4.8)
Alkaline Phosphatase: 150 IU/L — ABNORMAL HIGH (ref 44–121)
Anion Gap: 19 mmol/L — ABNORMAL HIGH (ref 10.0–18.0)
BUN/Creatinine Ratio: 22 (ref 12–28)
BUN: 19 mg/dL (ref 8–27)
Bilirubin Total: 0.3 mg/dL (ref 0.0–1.2)
CO2: 16 mmol/L — ABNORMAL LOW (ref 20–29)
Calcium: 9.9 mg/dL (ref 8.7–10.3)
Chloride: 100 mmol/L (ref 96–106)
Creatinine, Ser: 0.87 mg/dL (ref 0.57–1.00)
Globulin, Total: 3.6 g/dL (ref 1.5–4.5)
Glucose: 91 mg/dL (ref 65–99)
Potassium: 3.9 mmol/L (ref 3.5–5.2)
Sodium: 135 mmol/L (ref 134–144)
Total Protein: 8.3 g/dL (ref 6.0–8.5)
eGFR: 75 mL/min/{1.73_m2} (ref >59)

## 2020-12-05 LAB — ANA: Anti Nuclear Antibody (ANA): POSITIVE — AB

## 2020-12-05 LAB — HEPATITIS B CORE ANTIBODY, TOTAL: Hep B Core Total Ab: NEGATIVE

## 2020-12-05 LAB — HEPATITIS B SURFACE ANTIBODY,QUALITATIVE: Hep B Surface Ab, Qual: NONREACTIVE

## 2020-12-05 LAB — HEPATITIS B SURFACE ANTIGEN: Hepatitis B Surface Ag: NEGATIVE

## 2020-12-05 LAB — GAMMA GT: GGT: 902 IU/L (ref 0–60)

## 2020-12-05 LAB — FERRITIN: Ferritin: 605 ng/mL — ABNORMAL HIGH (ref 15–150)

## 2020-12-05 LAB — MITOCHONDRIAL ANTIBODIES: Mitochondrial Ab: <20 Units (ref 0.0–20.0)

## 2020-12-05 NOTE — Progress Notes (Signed)
Internal Medicine Clinic Attending  I saw and evaluated the patient.  I personally confirmed the key portions of the history and exam documented by Dr. Bridgett Larsson and I reviewed pertinent patient test results.  The assessment, diagnosis, and plan were formulated together and I agree with the documentation in the resident's note.   Patient with newly detected hepatomegaly on exam, we can palpate the liver 8cm below the ribs. She has worsening liver enzyme at 5x upper limit normal, normal bili, but elevated alk phos, so seems to be a mix of hepatocellular and cholestatic issue. Spleen is not felt, but she has mild thrombocytopenia at 132,000 so likely to have some amount of portal hypertension. No ascites. At risk for metabolic-associated liver disease due to obesity. Her alcohol intake has also been high-normal, but she has abstained from alcohol this week and the liver enzymes are improved. We will complete the evaluation for the cause of the liver disease by ruling out hemochromatosis, PBC, autoimmune, and infectious hepatitis. Wilson's is unlikely give alk phos:bili ratio greater than 4. Will refer her to GI for chronic liver care including variceal surveillance and Bonnie screening.   Currently there are no good medications to treat metabolic-associated liver disease. We will keep working to manage her underlying risk factors. There is some promising data for tirzepatide in reducing liver fat deposits in the Suprass-3 trial. The clinical trial Synergy-NASH is currently ongoing and may help, but currently not FDA approved.

## 2020-12-09 LAB — TRANSFERRIN SATURATION
IRON SATN MFR SERPL: 24 % Saturation
IRON SERPL-MCNC: 70 ug/dL
TRANSFERRIN SERPL-MCNC: 212 mg/dL

## 2020-12-11 LAB — ANTI-SMOOTH MUSCLE AB BY IFA: Anti-Smooth Muscle Ab by IFA: <1:20 {titer}

## 2020-12-16 ENCOUNTER — Other Ambulatory Visit: Payer: Self-pay | Admitting: Student

## 2020-12-16 DIAGNOSIS — M1A09X0 Idiopathic chronic gout, multiple sites, without tophus (tophi): Secondary | ICD-10-CM

## 2020-12-22 ENCOUNTER — Other Ambulatory Visit: Payer: Self-pay | Admitting: Student

## 2020-12-22 DIAGNOSIS — I10 Essential (primary) hypertension: Secondary | ICD-10-CM

## 2020-12-26 ENCOUNTER — Telehealth: Payer: Self-pay

## 2020-12-26 NOTE — Telephone Encounter (Signed)
TC to patient, she was informed MD sent this RX to pharmacy on 11/25/20 w/ 11 refills.  She states she went to pick it up and was told they didn't have it.  Receipt confirmation confirmed.  Patient informed RN will call pharmacy this morning and she was instructed to check with pharmacy this afternoon to see if RX was ready for pick up.  TC to pharmacy, spoke with pharmacist who states they do have the RX, it is on hold because when the patient came to pick it up, it was too early for refill.  Pharmacist will get it ready for pick up this morning. SChaplin, RN,BSN

## 2020-12-26 NOTE — Telephone Encounter (Signed)
olmesartan (BENICAR) 40 MG tablet, refill request @  [facility] ([place]), [place] - [address] Phone:  [phone]  Fax:  [phone]

## 2021-01-06 ENCOUNTER — Encounter: Payer: Self-pay | Admitting: *Deleted

## 2021-01-08 ENCOUNTER — Encounter: Payer: Self-pay | Admitting: *Deleted

## 2021-01-20 ENCOUNTER — Telehealth: Payer: Self-pay

## 2021-01-20 NOTE — Telephone Encounter (Signed)
Called pt - stated Dr Bridgett Larsson did send the coupon for Benicar; stated she wants her new doctor to be aware of this. She has no insurance; she has been paying  $14.00 - $19.00 with the coupon. Last rx was written with 11 refills.

## 2021-01-20 NOTE — Telephone Encounter (Signed)
Pt want to know if Dr. Bridgett Larsson left coupon for her when she goes to pick up her medication,  olmesartan (BENICAR) 40 MG tablet.  Please call pt back.

## 2021-01-21 ENCOUNTER — Other Ambulatory Visit: Payer: Self-pay

## 2021-01-21 MED ORDER — METOPROLOL TARTRATE 50 MG PO TABS: 50.0000 mg | ORAL_TABLET | Freq: Two times a day (BID) | ORAL | 5 refills | Status: DC

## 2021-01-21 NOTE — Telephone Encounter (Signed)
Pt would like  metoprolol tartrate (LOPRESSOR) 50 MG tablet to be filled @  [facility] ([place]), [place] - [address] Phone:  [phone]  Fax:  [phone]    Please call pt back.

## 2021-01-21 NOTE — Telephone Encounter (Signed)
Called pt - stated she wants rx sent to [facility] instead of [facility].

## 2021-02-04 NOTE — Addendum Note (Signed)
Addended by: Hulan Fray on: 02/04/2021 05:57 PM   Modules accepted: Orders

## 2021-06-23 ENCOUNTER — Ambulatory Visit (INDEPENDENT_AMBULATORY_CARE_PROVIDER_SITE_OTHER): Payer: Self-pay | Admitting: Internal Medicine

## 2021-06-23 VITALS — BP 176/77 | HR 80 | Temp 99.2°F | Wt 183.9 lb

## 2021-06-23 DIAGNOSIS — Z23 Encounter for immunization: Secondary | ICD-10-CM

## 2021-06-23 DIAGNOSIS — Z0000 Encounter for general adult medical examination without abnormal findings: Secondary | ICD-10-CM

## 2021-06-23 DIAGNOSIS — E781 Pure hyperglyceridemia: Secondary | ICD-10-CM

## 2021-06-23 DIAGNOSIS — K7460 Unspecified cirrhosis of liver: Secondary | ICD-10-CM

## 2021-06-23 DIAGNOSIS — I10 Essential (primary) hypertension: Secondary | ICD-10-CM

## 2021-06-23 MED ORDER — METOPROLOL TARTRATE 50 MG PO TABS: 50.0000 mg | ORAL_TABLET | Freq: Three times a day (TID) | ORAL | 2 refills | Status: DC

## 2021-06-23 NOTE — Assessment & Plan Note (Addendum)
Simvastatin stopped in setting of worsening transaminitis during previous visit 11/27/20. Lipid panel offered today, but pt deferring to once she has insurance.   F/u on CMP

## 2021-06-23 NOTE — Assessment & Plan Note (Addendum)
Flu shot given  Deferring lipid panel to once she has insurance  A1c 5.1 this year, A1c in ~6 mo

## 2021-06-23 NOTE — Patient Instructions (Addendum)
Thank you, Ms.Lenoard Aden for allowing [place] to provide your care today!  Today we discussed:  High blood pressure: Please start monitoring your BP at home, write it down and bring it with you the next time.  Please increase your metoprolol dose from 2 times a day to 3 times a day Please continue taking the olmesartan 1 time daily  Allergies: Try taking Zyrtec  I will call you if any labs require any further attention or action.   I have ordered the following labs for you:  Lab Orders         CMP14 + Anion Gap        Medications ordered or changed:  Increase metoprolol from 2 times to 3 times daily.   Follow up in: 1 months or sooner if needed    Should you have any questions or concerns please call the internal medicine clinic at [phone].     Lajean Manes, MD  Internal Medicine Resident, PGY-1 [facility]

## 2021-06-23 NOTE — Assessment & Plan Note (Addendum)
BP 175/81, repeat 176/77. Current regimen is metoprolol tartrate 50 bid and olmesartan 40. Reports excellent compliance without any adverse effects. Pt states elevated BP likely 2/2 not sleeping well x 2 nights because of allergies. She denies taking any OTC medications. Advised to take Zyrtec or other medications  that will not affect her BP. Given persistently elevated BP, will go up on metoprolol tartrate 50 bid from 2 to 3 times daily. HR 80, and has hx of tachycardia. Pt cannot tolerate amlodipine or HCTZ. No LE edema, but can consider loop diuretic given compensated cirrhosis if BP persistently elevated at f/u. Can also consider Carvedilol or Metop succ.  Continue olmesartan 40 qd Increase metoprolol from bid to tid  Consider loop diuretic, carvedilol or metop succ at f/u if BP persistently elevated BP log and bring at f/u Continue lifestyle modifications  F/u on CMP

## 2021-06-23 NOTE — Assessment & Plan Note (Addendum)
Pt reports that she has been cutting back on wine.; reports drinking a couple glasses a week. Counseled again on the importance of alcohol cessation. Pt denies changes in skin color, LE swelling, SHOB, confusion, abdominal pain, or weight changes. On exam, no LE pitting edema and overall benign PE. Vitals stable besides BP 175/81. GI referral was placed during previous visit on 5/11 but pt reports that she did not follow up with GI. Pt no longer has insurance and would like to avoid excess labs and referrals at this time until she obtains insurance again.  F/u on CMP  Continue to hold Simvastatin 40 Consider another GI referral at f/u if pt willing    Addendum: Improved AST 53 ALT 47 from 6 mo ago (AST 78 ALT 70). CTM.

## 2021-06-23 NOTE — Progress Notes (Signed)
CC: Routine clinic follow up for HTN  HPI:  Jenna May is a 65 y.o. female with a PMHx stated below and presents today for stated above. Please see the Encounters tab for problem-based Assessment & Plan for additional details.   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 Anemia panel in 03/2010 showing Iron 53 , TIBC 303 , iron saturation percentage 17, ferritin 197 , B12 and folate within normal limits.   Paronychia of toe, right 11/03/2018   Postmenopausal status    Subjective vision disturbance, left eye 03/17/2018   Tobacco abuse    Quit 2009, after approximately 15 pack year smoking history.   Umbilical hernia    S/P repair approximately 1995   Urine abnormality 05/13/2017    Current Outpatient Medications on File Prior to Visit  Medication Sig Dispense Refill   fish oil-omega-3 fatty acids 1000 MG capsule Take 2 capsules (2 g total) by mouth daily. 60 capsule 5   fluticasone (FLONASE) 50 MCG/ACT nasal spray Place 1 spray into both nostrils daily for 7 days. 1 g 0   Menthol-Methyl Salicylate (MUSCLE RUB) 10-15 % CREA Apply 1 application topically as needed for muscle pain.     metoprolol tartrate (LOPRESSOR) 50 MG tablet Take 1 tablet (50 mg total) by mouth 2 (two) times daily. 60 tablet 5   Multiple Vitamin (MULTIVITAMIN WITH MINERALS) TABS tablet Take 1 tablet by mouth daily.     naproxen (NAPROSYN) 500 MG tablet TAKE 1/2 TABLET (250MG  TOTAL) BY MOUTH EVERY 8 HOURS AS NEEDED. TAKE 1 AND 1/2 TABLETS INITIALLY WHEN YOUR GOUT FLARE STARTS. THEN TAKE 1/2 TABLET EVERY  8 HOURS AS NEEDED. 20 tablet 0   olmesartan (BENICAR) 40 MG tablet Take 1 tablet (40 mg total) by mouth daily. 30 tablet 11   simvastatin (ZOCOR) 40 MG tablet Take 1 tablet (40 mg total) by mouth daily. 30 tablet 11   No current facility-administered medications on file prior to visit.    Family History  Problem Relation Age of Onset   Hypertension Mother    Diabetes Father    Hypertension Father    Hypertension Sister    Heart attack Brother 28   Breast cancer Neg Hx     Social History   Socioeconomic History   Marital status: Single    Spouse name: Not on file   Number of children: 0   Years of education: 12th grade   Highest education level: High school graduate  Occupational History   Occupation: Equities trader: UNEMPLOYED  Tobacco Use   Smoking status: Former    Packs/day: 0.40    Years: 30.00    Pack years: 12.00    Types: Cigarettes    Quit date: 04/11/2008    Years since quitting: 13.2   Smokeless tobacco: Never  Vaping Use   Vaping Use: Never used  Substance and Sexual Activity   Alcohol use: Yes    Alcohol/week: 0.0 standard drinks    Comment: occassional glass of  wine   Drug use: No   Sexual activity: Not Currently    Birth control/protection: Surgical  Other Topics Concern   Not on file  Social History Narrative   Unemployed. Intermittently worked, Museum/gallery curator, Doctor, hospital.    Graduated from high school, [employer].   Single.   No children.   Orange card.   Social Determinants of Health   Financial Resource Strain: Not on file  Food Insecurity: Not on file  Transportation Needs: Not on file  Physical Activity: Not on file  Stress: Not on file  Social Connections: Not on file  Intimate Partner Violence: Not on file    Review of Systems: ROS negative except for what is noted on the assessment and plan.  Vitals:   06/23/21 1446  BP: (!) 175/81  Pulse: 80  Temp: 99.2 F (37.3 C)  TempSrc: Oral  SpO2: 100%   Weight: 183 lb 14.4 oz (83.4 kg)     Physical Exam: Constitutional: alert, well-appearing, in NAD HENT: normocephalic, atraumatic, mucous membranes moist Eyes: conjunctiva non-erythematous, EOMI Cardiovascular: RRR, no m/r/g, non-edematous bilateral LE Pulmonary/Chest: normal work of breathing on RA, LCTAB Abdominal: soft, non-tender to palpation, non-distended MSK: normal bulk and tone  Neurological: A&O x 3   Assessment & Plan:   See Encounters Tab for problem based charting.  Patient seen with Dr. Tera Helper, MD  Internal Medicine Resident, PGY-1 [facility]

## 2021-06-23 NOTE — Assessment & Plan Note (Signed)
>>  ASSESSMENT AND PLAN FOR ELEVATED LIVER ENZYMES WRITTEN ON 06/24/2021  9:04 AM BY PATEL, MONIK, MD  Pt reports that she has been cutting back on wine.; reports drinking a couple glasses a week. Counseled again on the importance of alcohol cessation. Pt denies changes in skin color, LE swelling, SHOB, confusion, abdominal pain, or weight changes. On exam, no LE pitting edema and overall benign PE. Vitals stable besides BP 175/81. GI referral was placed during previous visit on 5/11 but pt reports that she did not follow up with GI. Pt no longer has insurance and would like to avoid excess labs and referrals at this time until she obtains insurance again.  F/u on CMP  Continue to hold Simvastatin  40 Consider another GI referral at f/u if pt willing    Addendum: Improved AST 53 ALT 47 from 6 mo ago (AST 78 ALT 70). CTM.

## 2021-06-24 ENCOUNTER — Telehealth: Payer: Self-pay

## 2021-06-24 LAB — CMP14 + ANION GAP
ALT: 47 IU/L — ABNORMAL HIGH (ref 0–32)
AST: 53 IU/L — ABNORMAL HIGH (ref 0–40)
Albumin/Globulin Ratio: 1.5 (ref 1.2–2.2)
Albumin: 4.4 g/dL (ref 3.8–4.8)
Alkaline Phosphatase: 154 IU/L — ABNORMAL HIGH (ref 44–121)
Anion Gap: 19 mmol/L — ABNORMAL HIGH (ref 10.0–18.0)
BUN/Creatinine Ratio: 15 (ref 12–28)
BUN: 13 mg/dL (ref 8–27)
Bilirubin Total: 0.5 mg/dL (ref 0.0–1.2)
CO2: 18 mmol/L — ABNORMAL LOW (ref 20–29)
Calcium: 9.1 mg/dL (ref 8.7–10.3)
Chloride: 98 mmol/L (ref 96–106)
Creatinine, Ser: 0.86 mg/dL (ref 0.57–1.00)
Globulin, Total: 3 g/dL (ref 1.5–4.5)
Glucose: 88 mg/dL (ref 70–99)
Potassium: 3.3 mmol/L — ABNORMAL LOW (ref 3.5–5.2)
Sodium: 135 mmol/L (ref 134–144)
Total Protein: 7.4 g/dL (ref 6.0–8.5)
eGFR: 76 mL/min/{1.73_m2} (ref >59)

## 2021-06-24 NOTE — Telephone Encounter (Signed)
Received TC from patient who states she saw Dr. Posey Pronto yesterday and had Rx's for Metoprolol and Olmesartan sent.  States physician told her he was going to send a coupon with the Rx's, but when she went to p/u the RX's no coupon had been sent and she can't afford the med.  Asked patient if she usually gets [place], she states she doesn't know. Will forward to Dr. Posey Pronto and to [place], pharmacy tech to see if she can apply coupon to existing RX's. Thank you SChaplin, RN,BSN

## 2021-06-24 NOTE — Telephone Encounter (Signed)
Will do. Thank you

## 2021-06-24 NOTE — Telephone Encounter (Signed)
Thanks Dr. Posey Pronto!  Rosendo Gros, Will you please reach out to this patient, she told me she gets some type of coupon every month from [place]? Thank you! Arcangel Minion

## 2021-06-25 ENCOUNTER — Telehealth: Payer: Self-pay

## 2021-06-25 NOTE — Telephone Encounter (Signed)
Spoke with pharmacist to f/u with pt's copays on metoprolol 50mg  and olmesartan 40mg .  GoodRx is still being used on olmesartan medication... only difference between this month & the past is the goodrx contract with [facility] changed-- her copay went from $16 to $18.   As for pt's metoprolol, this is on their $4 list, however for 90 tablets its $6. Medication is currently pending fill at pharmacy. Pharmacy sent a verification fax to our office to clarify that Dr Posey Pronto wanted pt to do 50mg  THREE times daily instead of TWICE daily like she has been for the past year. Please clarify with pharmacy.

## 2021-06-27 NOTE — Progress Notes (Signed)
Internal Medicine Clinic Attending  I saw and evaluated the patient.  I personally confirmed the key portions of the history and exam documented by Dr. Patel and I reviewed pertinent patient test results.  The assessment, diagnosis, and plan were formulated together and I agree with the documentation in the resident's note.  

## 2021-09-15 ENCOUNTER — Ambulatory Visit (INDEPENDENT_AMBULATORY_CARE_PROVIDER_SITE_OTHER): Payer: Self-pay | Admitting: Internal Medicine

## 2021-09-15 VITALS — BP 171/72 | HR 62 | Temp 98.4°F | Wt 180.7 lb

## 2021-09-15 DIAGNOSIS — Z23 Encounter for immunization: Secondary | ICD-10-CM

## 2021-09-15 DIAGNOSIS — E781 Pure hyperglyceridemia: Secondary | ICD-10-CM

## 2021-09-15 DIAGNOSIS — R748 Abnormal levels of other serum enzymes: Secondary | ICD-10-CM

## 2021-09-15 DIAGNOSIS — K7460 Unspecified cirrhosis of liver: Secondary | ICD-10-CM

## 2021-09-15 DIAGNOSIS — Z0000 Encounter for general adult medical examination without abnormal findings: Secondary | ICD-10-CM

## 2021-09-15 DIAGNOSIS — Z5989 Other problems related to housing and economic circumstances: Secondary | ICD-10-CM

## 2021-09-15 DIAGNOSIS — M1A09X0 Idiopathic chronic gout, multiple sites, without tophus (tophi): Secondary | ICD-10-CM

## 2021-09-15 DIAGNOSIS — R7401 Elevation of levels of liver transaminase levels: Secondary | ICD-10-CM

## 2021-09-15 DIAGNOSIS — I10 Essential (primary) hypertension: Secondary | ICD-10-CM

## 2021-09-15 MED ORDER — METOPROLOL TARTRATE 100 MG PO TABS: 100.0000 mg | ORAL_TABLET | Freq: Two times a day (BID) | ORAL | 3 refills | Status: DC

## 2021-09-15 MED ORDER — OLMESARTAN MEDOXOMIL 40 MG PO TABS: 40.0000 mg | ORAL_TABLET | Freq: Every day | ORAL | 11 refills | Status: DC

## 2021-09-15 MED ORDER — NAPROXEN 500 MG PO TABS: ORAL_TABLET | ORAL | 0 refills | Status: DC

## 2021-09-15 NOTE — Progress Notes (Signed)
° °  CC: routine visit  HPI:  [name] is a 66 y.o. female with hypertension, cirrhosis, and HLD who presents to the [facility] for a routine visit. Please see problem-based list for further details, assessments, and plans.   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 Anemia panel in 03/2010 showing Iron 53 , TIBC 303 , iron saturation percentage 17, ferritin 197 , B12 and folate within normal limits.   Paronychia of toe, right 11/03/2018   Postmenopausal status    Subjective vision disturbance, left eye 03/17/2018   Tobacco abuse    Quit 2009, after approximately 15 pack year smoking history.   Umbilical hernia    S/P repair approximately 1995   Urine abnormality 05/13/2017   Review of Systems:  Review of Systems  Constitutional:  Negative for chills and fever.  Eyes:  Negative for blurred vision.  Respiratory:  Negative for cough and shortness of breath.   Cardiovascular:  Negative for chest pain and palpitations.  Gastrointestinal:  Negative for diarrhea, nausea and vomiting.  Genitourinary: Negative.   Neurological:  Negative for dizziness, loss of consciousness and headaches.    Physical Exam:  Vitals:   09/15/21 1433  BP: (!) 174/74  Pulse: 65  SpO2: 100%  Weight: 180 lb 11.2 oz (82 kg)   General: Anxious appearing female. No acute distress. CV: RRR. No murmurs. No LE edema Pulmonary: Lungs CTAB. Normal effort.  Abdominal: Soft, nontender, questionable hepatomegaly. Normal bowel sounds. Skin: Warm and dry.  Neuro: A&Ox3. Moves all extremities. Normal  sensation. No focal deficit. Psych: Normal mood and affect   Assessment & Plan:   See Encounters Tab for problem based charting.  Patient discussed with Dr.  Cain Sieve

## 2021-09-15 NOTE — Progress Notes (Signed)
Internal Medicine Clinic Attending  Case discussed with Dr. Raymondo Band  At the time of the visit.  We reviewed the residents history and exam and pertinent patient test results.  I agree with the assessment, diagnosis, and plan of care documented in the residents note.    To our knowledge, patient does not have a confirmed diagnosis of cirrhosis. Will repeat CMP today. I'd like to check a liver U/S, but patient does not have insurance right now, so will only get this if CMP remains abnormal.

## 2021-09-15 NOTE — Assessment & Plan Note (Addendum)
Patient has a reported history of cirrhosis, although I do not see any ultrasound findings or liver biopsy to confirm this diagnosis. She has a history of transaminitis, which was thought to be secondary to simvastatin use. In November, her LFTs were improving. She was supposed to follow up with GI, however, the patient does not have insurance, and is unable to see GI. Will recheck CMP today and if numbers are persistently elevated, will refer patient to get an ultrasound of the liver.   Plan: - CMP today  Addendum: Alk phos 163, AST/ ALT 37/48. Discussed getting an ultrasound with the patient, however, she is self pay and is not able to afford it at this time. Will consider getting ultrasound once patient pays off most recent bills.

## 2021-09-15 NOTE — Assessment & Plan Note (Addendum)
Patient has been off simvastatin as it was thought to be the cause of the patient's worsening transaminitis. Will recheck lipid panel today.   LDL 51, trigs remain elevated in the 500s (although improved). Patient called and made aware.

## 2021-09-15 NOTE — Assessment & Plan Note (Signed)
BP 174/74, repeat 171/72. Patient denies any headache, blurred vision, dizziness/lightheadedness, chest pain, or palpitations. Her current regimen includes metoprolol tartrate 50 mg tid and olmesartan 40 mg daily. She reports adherence with these medications with any issues. 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   Given persistently elevated BP, will increase metoprolol from 50 mg tid to 100 mg bid, in addition to continuing olmesartan 40 mg. Advised patient to check her BP daily and to log the numbers. She has difficulties with transportation, so recommended she schedule a tele-health visit in 2 weeks to discuss what her BP readings have been, so we can adjust her antihypertensive regimen as need be.   Plan: - Metoprolol 100 mg bid - Olmesartan 40 mg daily  - Recheck BP in 2 weeks, ok to call in with BP readings

## 2021-09-15 NOTE — Patient Instructions (Signed)
Thank you, Jenna May for allowing [place] to provide your care today. Today we discussed:  High blood pressure: keep taking olmesartan 40 mg every day. Increase your metoprolol to 100 mg and take this medicine twice daily. Please call our clinic back in 2 weeks for a virtual visit, so we can see how your blood pressure has been doing. Remember to check it everyday and to write down the numbers  Cholesterol: We are checking your cholesterol levels today  Liver: We are rechecking your liver function today. I will call you with these results, and if they are abnormal, we may send you to get an ultrasound of your liver  Gout: Refilled naproxen  Gave you the pneumonia vaccine today  I have ordered the following labs for you:   Lab Orders         CMP14 + Anion Gap         Lipid Profile      Tests ordered today:  none  Referrals ordered today:   Referral Orders  No referral(s) requested today     I have ordered the following medication/changed the following medications:   Stop the following medications: Medications Discontinued During This Encounter  Medication Reason   simvastatin (ZOCOR) 40 MG tablet Discontinued by provider   olmesartan (BENICAR) 40 MG tablet Reorder   naproxen (NAPROSYN) 500 MG tablet Reorder   metoprolol tartrate (LOPRESSOR) 50 MG tablet Reorder     Start the following medications: Meds ordered this encounter  Medications   naproxen (NAPROSYN) 500 MG tablet    Sig: TAKE 1/2 TABLET (250MG  TOTAL) BY MOUTH EVERY 8 HOURS AS NEEDED. TAKE 1 AND 1/2 TABLETS INITIALLY WHEN YOUR GOUT FLARE STARTS. THEN TAKE 1/2 TABLET EVERY 8 HOURS AS NEEDED.    Dispense:  20 tablet    Refill:  0   olmesartan (BENICAR) 40 MG tablet    Sig: Take 1 tablet (40 mg total) by mouth daily.    Dispense:  30 tablet    Refill:  11   metoprolol tartrate (LOPRESSOR) 100 MG tablet    Sig: Take 1 tablet (100 mg total) by mouth 2 (two) times daily.    Dispense:  60 tablet    Refill:   3     Follow up: 6 months for routine visit   Remember: To call us if your medications are too expensive at the [facility]- we can send them to the [facility] to to the [facility] at [facility] to see if they are more affordable there  Should you have any questions or concerns please call the internal medicine clinic at [phone].     Buddy Duty, D.O. [place]

## 2021-09-15 NOTE — Assessment & Plan Note (Signed)
Prevnar 20 vaccine given today

## 2021-09-16 LAB — CMP14 + ANION GAP
ALT: 48 IU/L — ABNORMAL HIGH (ref 0–32)
AST: 37 IU/L (ref 0–40)
Albumin/Globulin Ratio: 1.3 (ref 1.2–2.2)
Albumin: 4.3 g/dL (ref 3.8–4.8)
Alkaline Phosphatase: 163 IU/L — ABNORMAL HIGH (ref 44–121)
Anion Gap: 17 mmol/L (ref 10.0–18.0)
BUN/Creatinine Ratio: 18 (ref 12–28)
BUN: 15 mg/dL (ref 8–27)
Bilirubin Total: 0.5 mg/dL (ref 0.0–1.2)
CO2: 17 mmol/L — ABNORMAL LOW (ref 20–29)
Calcium: 9.7 mg/dL (ref 8.7–10.3)
Chloride: 104 mmol/L (ref 96–106)
Creatinine, Ser: 0.84 mg/dL (ref 0.57–1.00)
Globulin, Total: 3.4 g/dL (ref 1.5–4.5)
Glucose: 85 mg/dL (ref 70–99)
Potassium: 3.9 mmol/L (ref 3.5–5.2)
Sodium: 138 mmol/L (ref 134–144)
Total Protein: 7.7 g/dL (ref 6.0–8.5)
eGFR: 78 mL/min/{1.73_m2} (ref >59)

## 2021-09-16 LAB — LIPID PANEL
Chol/HDL Ratio: 5.3 ratio — ABNORMAL HIGH (ref 0.0–4.4)
Cholesterol, Total: 155 mg/dL (ref 100–199)
HDL: 29 mg/dL — ABNORMAL LOW (ref >39)
LDL Chol Calc (NIH): 51 mg/dL (ref 0–99)
Triglycerides: 509 mg/dL — ABNORMAL HIGH (ref 0–149)
VLDL Cholesterol Cal: 75 mg/dL — ABNORMAL HIGH (ref 5–40)

## 2021-09-16 NOTE — Addendum Note (Signed)
Addended by: Buddy Duty on: 09/16/2021 11:57 AM   Modules accepted: Orders

## 2021-09-17 ENCOUNTER — Telehealth: Payer: Self-pay | Admitting: *Deleted

## 2021-09-17 NOTE — Chronic Care Management (AMB) (Signed)
°  Care Management   Note  09/17/2021 Name: [name] MRN: [id number] DOB: [birth year]  Jenna May is a 66 y.o. year old female who is a primary care patient of Atway, Rayann N, DO. I reached out to [employer] by phone today in response to a referral sent by Ms. Nyiesha Devers's primary care provider.   Ms. Grumbine was given information about care management services today including:  Care management services include personalized support from designated clinical staff supervised by her physician, including individualized plan of care and coordination with other care providers 24/7 contact phone numbers for assistance for urgent and routine care needs. The patient may stop care management services at any time by phone call to the office staff.  Patient agreed to services and verbal consent obtained.   Follow up plan: Telephone appointment with care management team member scheduled for:09/23/21  [facility] Management  Direct Dial: [phone]

## 2021-09-18 ENCOUNTER — Other Ambulatory Visit: Payer: Self-pay | Admitting: Internal Medicine

## 2021-09-23 ENCOUNTER — Ambulatory Visit: Payer: Medicaid Other | Admitting: Licensed Clinical Social Worker

## 2021-09-23 NOTE — Chronic Care Management (AMB) (Signed)
°  Care Management   Social Work Visit Note  09/23/2021 Name: Jenna May MRN: [id number] DOB: Oct 26, 1955  Jenna May is a 66 y.o. year old female who sees Atway, Rayann N, DO for primary care. The care management team was consulted for assistance with care management and care coordination needs related to [facility] Resources    Patient was given the following information about care management and care coordination services today, agreed to services, and gave verbal consent: 1.care management/care coordination services include personalized support from designated clinical staff supervised by their physician, including individualized plan of care and coordination with other care providers 2. 24/7 contact phone numbers for assistance for urgent and routine care needs. 3. The patient may stop care management/care coordination services at any time by phone call to the office staff.  Engaged with patient by telephone for initial visit in response to provider referral for social work chronic care management and care coordination services.  Assessment: Review of patient history, allergies, and health status during evaluation of patient need for care management/care coordination services.    Interventions:  Patient interviewed and appropriate assessments performed Collaborated with clinical team regarding patient needs  SW and patient discussed patient contacting [facility] to set payment arrangments for recent bill received. Patient advised she is unable to pay in full, but can make payments.  SW gave patient information on [employer] .  Patient receives food stamps and is current on rent.  No further assistance is needed. Patient advised she would contact SW in the future if needed. SW gave patient contact information.  Patient declined being in pain or needing to speak with RNCM.  SDOH (Social Determinants of Health) assessments performed: Yes     Plan:  No further follow up  needed.   Milus Height, [place]  Social Worker [facility] Care Management  [phone]

## 2021-09-23 NOTE — Patient Instructions (Signed)
Visit Information ? ?Instructions:  ? ?Patient was given the following information about care management and care coordination services today, agreed to services, and gave verbal consent: 1.care management/care coordination services include personalized support from designated clinical staff supervised by their physician, including individualized plan of care and coordination with other care providers 2. 24/7 contact phone numbers for assistance for urgent and routine care needs. 3. The patient may stop care management/care coordination services at any time by phone call to the office staff. ? ?Patient verbalizes understanding of instructions and care plan provided today and agrees to view in MyChart. Active MyChart status confirmed with patient.   ? ?No further follow up required: . ? ?Adlynn Lowenstein, BSW  ?Social Worker ?[facility] Care Management  ?[phone] ?  ? ?  ?

## 2021-09-29 ENCOUNTER — Encounter: Payer: Self-pay | Admitting: *Deleted

## 2021-09-29 ENCOUNTER — Other Ambulatory Visit: Payer: Self-pay

## 2021-09-29 ENCOUNTER — Ambulatory Visit (INDEPENDENT_AMBULATORY_CARE_PROVIDER_SITE_OTHER): Payer: Self-pay | Admitting: Internal Medicine

## 2021-09-29 DIAGNOSIS — I10 Essential (primary) hypertension: Secondary | ICD-10-CM

## 2021-09-29 DIAGNOSIS — R7401 Elevation of levels of liver transaminase levels: Secondary | ICD-10-CM

## 2021-09-29 NOTE — Patient Instructions (Signed)
Please continue taking your BP medications  ? ?Continue logging your BP ? ?Follow up with a phone call in 2 weeks  ? ?Consider thinking about applying for the [facility] card ? ?

## 2021-09-29 NOTE — Progress Notes (Signed)
Internal Medicine Clinic Attending  Case discussed with Dr. Patel  At the time of the visit.  We reviewed the resident's history and exam and pertinent patient test results.  I agree with the assessment, diagnosis, and plan of care documented in the resident's note.  

## 2021-09-29 NOTE — Assessment & Plan Note (Signed)
Pt in need of RUQ [place] and possibly GI referral. Clarified with pt; she is currently uninsured, and she is not interested in applying for the [facility] card as it has not worked out for her in the past. She said she is working paying her current bills, and pay out-of-pocket possibly for an U/S if needed in about 1-2 months. She also states that she will have Medicare next year and is looking forward to that.  ? ?Tentative RUQ [place] in ~1-2 months once pt is able to pay for it ?Encourage pt to re-apply for [facility] card during next office visit.  ?

## 2021-09-29 NOTE — Assessment & Plan Note (Signed)
2 week follow-up for HTN. BP was 174/74 and repeat 171/72 during OV 2 weeks ago. She was instructed increase her metoprolol tartrate from 50 BID to 100 BID, continue Olmesartan 40 mg daily, and start monitoring BP at home and record in a log. She is following up via tele-health visit today. She reports home readings ranging from 130-140/80'. She reports feeling much better since BP has been better controlled. She denies vision changes, CP, SHOB, and palpitations. No medication refills needed . ? ?-Continue  metoprolol tartrate 100 BID and Olmesartan 40 mg daily ?-Follow up in 2 more weeks via tele health and make medication adjustments if needed; pt has difficulty with transportation for in-person visits.  ?

## 2021-09-29 NOTE — Progress Notes (Addendum)
? ?  CC: 2 week BP follow-up  ? ?This is a telephone encounter between [employer] and [employer] on 09/29/2021 at 845 am for stated above. The visit was conducted with the patient located at home and [facility] at [facility]. The patient's identity was confirmed using their DOB and current address. The patient has consented to being evaluated through a telephone encounter and understands the associated risks (an examination cannot be done and the patient may need to come in for an appointment) / benefits (allows the patient to remain at home, decreasing exposure to coronavirus). I personally spent 10 minutes on medical discussion.  ? ?HPI: ? ?[name] is a 66 y.o. with PMH as below.  ? ?Please see A&P for assessment of the patient's acute and chronic medical conditions.  ? ?Past Medical History:  ?Diagnosis Date  ? ANEMIA, NORMOCYTIC 06/14/2006  ? Anxiety   ? Bartholin cyst 06/2003  ? 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 Gout   ? Hemorrhoids 12/03/2016  ? History of prolonged Q-T interval on ECG 01/06/2019  ? Hypertension   ? Ingrown hair 04/13/2019  ? Lipoma 03/2005  ? Adjacent to proximal right biceps tendon on MRI  ? LIPOMA 08/19/2006  ? Adjacent to proximal right biceps tendon MRI 09/06    ? Normocytic anemia   ? Unclear etiology. Anemia panel in 03/2010 showing Iron 53 , TIBC 303 , iron saturation percentage 17, ferritin 197 , B12 and folate within normal limits.  ? Paronychia of toe, right 11/03/2018  ? Postmenopausal status   ? Subjective vision disturbance, left eye 03/17/2018  ? Tobacco abuse   ? Quit 2009, after approximately 15 pack year smoking history.  ? Umbilical hernia   ? S/P repair approximately 1995  ? Urine abnormality 05/13/2017  ? ?Review of Systems:  ROS negative except for what is noted on the assessment and plan ? ? ?Assessment & Plan:  ? ?See Encounters Tab for problem based charting. ? ?Patient discussed with Dr. Cain Sieve  ? ?Lajean Manes, MD ?Internal Medicine Resident, PGY-1 ?Internal  Medicine Clinic  ?

## 2021-10-13 ENCOUNTER — Ambulatory Visit (INDEPENDENT_AMBULATORY_CARE_PROVIDER_SITE_OTHER): Payer: Self-pay | Admitting: Internal Medicine

## 2021-10-13 DIAGNOSIS — I10 Essential (primary) hypertension: Secondary | ICD-10-CM

## 2021-10-13 NOTE — Progress Notes (Signed)
? ?  CC: 2 week follow up for HTN  ? ?This is a telephone encounter between [employer] and [employer] on 10/13/2021 for stated above. The visit was conducted with the patient located at home and [facility] at [facility]. The patient's identity was confirmed using their DOB and current address. The patient has consented to being evaluated through a telephone encounter and understands the associated risks (an examination cannot be done and the patient may need to come in for an appointment) / benefits (allows the patient to remain at home, decreasing exposure to coronavirus). I personally spent 11 minutes on medical discussion.  ? ?HPI: ? ?[name] is a 66 y.o. with PMH as below.  ? ?Please see A&P for assessment of the patient's acute and chronic medical conditions.  ? ?Past Medical History:  ?Diagnosis Date  ? ANEMIA, NORMOCYTIC 06/14/2006  ? Anxiety   ? Bartholin cyst 06/2003  ? 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 Gout   ? Hemorrhoids 12/03/2016  ? History of prolonged Q-T interval on ECG 01/06/2019  ? Hypertension   ? Ingrown hair 04/13/2019  ? Lipoma 03/2005  ? Adjacent to proximal right biceps tendon on MRI  ? LIPOMA 08/19/2006  ? Adjacent to proximal right biceps tendon MRI 09/06    ? Normocytic anemia   ? Unclear etiology. Anemia panel in 03/2010 showing Iron 53 , TIBC 303 , iron saturation percentage 17, ferritin 197 , B12 and folate within normal limits.  ? Paronychia of toe, right 11/03/2018  ? Postmenopausal status   ? Subjective vision disturbance, left eye 03/17/2018  ? Tobacco abuse   ? Quit 2009, after approximately 15 pack year smoking history.  ? Umbilical hernia   ? S/P repair approximately 1995  ? Urine abnormality 05/13/2017  ? ?Review of Systems:  ROS negative except for what is noted on the assessment and plan ? ? ?Assessment & Plan:  ? ?See Encounters Tab for problem based charting. ? ?Patient discussed with Dr. Jimmye Norman ? ?Lajean Manes, MD ?Internal Medicine Resident, PGY-1 ?Internal Medicine  Clinic  ?

## 2021-10-13 NOTE — Assessment & Plan Note (Addendum)
2 week tele-health visit for HTN. Was seen in office 1 mo ago with pressures 174/74; uptitrated metoprolol tartrate from 50 BID to 100 BID and continued Olmesartan 40 mg daily. Tele-visit 2 weeks ago with reported BP 140/80's. BMP 1 month ago with stable renal function, and chronic metabolic acidosis. Today, she reports readings 130/75's and reports excellent medication compliance with no reported adverse effects. She denies lightheadedness and dizziness. She reports feeling much better since BP has been better controlled. She denies vision changes, CP, SHOB, and palpitations.  ?? ?-Continue  metoprolol tartrate 100 BID and Olmesartan 40 mg daily-- no medication changes today.  ?-F/u in 1 month with PCP/Dr Atway  ?

## 2021-10-13 NOTE — Patient Instructions (Signed)
Please continue taking your medications as you have been  ?Continue measuring your BP at home  ?We will see you in 1 month with your primary doctor, Dr Raymondo Band.  ?

## 2021-10-18 ENCOUNTER — Other Ambulatory Visit: Payer: Self-pay | Admitting: Internal Medicine

## 2021-10-28 NOTE — Progress Notes (Signed)
Internal Medicine Clinic Attending  Case discussed with Dr. Patel  At the time of the visit.  We reviewed the resident's history and exam and pertinent patient test results.  I agree with the assessment, diagnosis, and plan of care documented in the resident's note.  

## 2021-11-05 ENCOUNTER — Ambulatory Visit (INDEPENDENT_AMBULATORY_CARE_PROVIDER_SITE_OTHER): Payer: Self-pay | Admitting: Internal Medicine

## 2021-11-05 DIAGNOSIS — I10 Essential (primary) hypertension: Secondary | ICD-10-CM

## 2021-11-05 NOTE — Patient Instructions (Signed)
Thank you, Ms.Marchelle Mccutchan for allowing [place] to provide your care today. Today we discussed: ? ?Hypertension: Continue to take metoprolol 100 mg twice daily and olmesartan 40 mg daily ? ?Itching: If you continue to have itching even after taking your allergy medicine regularly (zyrtec) please come in for an appt as you may need to switch medications or we may also need to work this up further ? ?I have ordered the following labs for you: ? ?Lab Orders  ?No laboratory test(s) ordered today  ?  ? ?Tests ordered today: ? ?none ? ?Referrals ordered today:  ? ?Referral Orders  ?No referral(s) requested today  ?  ? ?I have ordered the following medication/changed the following medications:  ? ?Stop the following medications: ?There are no discontinued medications.  ? ?Start the following medications: ?No orders of the defined types were placed in this encounter. ?  ? ?Follow up: 2-3 months for BP  ? ? ? ?Should you have any questions or concerns please call the internal medicine clinic at [phone].   ? ? ?Buddy Duty, D.O. ?[place] ? ? ?

## 2021-11-05 NOTE — Progress Notes (Signed)
?  [facility] Telephone Encounter ?Continuity Care Appointment ? ?HPI:  ?This telephone encounter was created for [name] on 11/05/2021 for the following purpose/cc hypertension.  ? ?Past Medical History:  ?Past Medical History:  ?Diagnosis Date  ? ANEMIA, NORMOCYTIC 06/14/2006  ? Anxiety   ? Bartholin cyst 06/2003  ? 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 Gout   ? Hemorrhoids 12/03/2016  ? History of prolonged Q-T interval on ECG 01/06/2019  ? Hypertension   ? Ingrown hair 04/13/2019  ? Lipoma 03/2005  ? Adjacent to proximal right biceps tendon on MRI  ? LIPOMA 08/19/2006  ? Adjacent to proximal right biceps tendon MRI 09/06    ? Normocytic anemia   ? Unclear etiology. Anemia panel in 03/2010 showing Iron 53 , TIBC 303 , iron saturation percentage 17, ferritin 197 , B12 and folate within normal limits.  ? Paronychia of toe, right 11/03/2018  ? Postmenopausal status   ? Subjective vision disturbance, left eye 03/17/2018  ? Tobacco abuse   ? Quit 2009, after approximately 15 pack year smoking history.  ? Umbilical hernia   ? S/P repair approximately 1995  ? Urine abnormality 05/13/2017  ?  ? ?ROS:  ?Review of Systems  ?Constitutional:  Negative for chills and fever.  ?Respiratory:  Negative for cough and shortness of breath.   ?Cardiovascular:  Negative for chest pain and palpitations.  ?Gastrointestinal:  Negative for diarrhea, nausea and vomiting.  ?Musculoskeletal:  Negative for myalgias.  ?Neurological:  Negative for dizziness and headaches.   ? ?Assessment / Plan / Recommendations:  ?Please see A&P under problem oriented charting for assessment of the patient's acute and chronic medical conditions.  ?As always, pt is advised that if symptoms worsen or new symptoms arise, they should go to an urgent care facility or to to ER for further evaluation.  ? ?Consent and Medical Decision Making:  ?Patient discussed with Dr. Philipp Ovens ?This is a telephone encounter between Jenna May and  Jenna May N Jenna May on 11/05/2021 for hypertension follow up. The visit was conducted with the patient located at home and Jenna May at [facility]. The patient's identity was confirmed using their DOB and current address. The patient has consented to being evaluated through a telephone encounter and understands the associated risks (an examination cannot be done and the patient may need to come in for an appointment) / benefits (allows the patient to remain at home, decreasing exposure to coronavirus). I personally spent 20 minutes on medical discussion.   ? ? ?

## 2021-11-05 NOTE — Assessment & Plan Note (Addendum)
Patient had a tele-health appt today to follow up on her hypertension. She has been taking metoprolol 100 mg bid and olmesartan 40 mg daily. She denies any headache, dizziness, lightheadedness, vision changes, chest pain, or SOB. At home, her BP has been usually running in the 130s/70-80s, with the lowest systolic reading 595 and the highest being 147.  ? ?She states that since her metoprolol was increased from 50 mg to 100 mg, she has developed itching on her arms and legs, with no visible rash present. The patient has a history of seasonal allergies and has not been taking her zyrtec regularly. Discussed taking this medication regularly to see if it helps her itching, as pruritis is not a reported side effect with metoprolol. If her symptoms persist with her Zyrtec, she may need her metoprolol switched to coreg or she may need her pruritis worked up further (she has a questionable history of cirrhosis). Patient would need an in-person visit for this.  ? ?Plan: ?- Continue olmesartan 40 mg daily ?- Continue metoprolol tartrate 100 mg bid (consider switch to coreg if pruritis persists) ?

## 2021-11-05 NOTE — Progress Notes (Signed)
Internal Medicine Clinic Attending ° °Case discussed with Dr. Atway  At the time of the visit.  We reviewed the resident’s history and exam and pertinent patient test results.  I agree with the assessment, diagnosis, and plan of care documented in the resident’s note.  °

## 2022-01-14 ENCOUNTER — Other Ambulatory Visit: Payer: Self-pay | Admitting: Internal Medicine

## 2022-04-01 ENCOUNTER — Encounter: Payer: Medicaid Other | Admitting: Internal Medicine

## 2022-04-09 ENCOUNTER — Ambulatory Visit (INDEPENDENT_AMBULATORY_CARE_PROVIDER_SITE_OTHER): Payer: Self-pay | Admitting: Internal Medicine

## 2022-04-09 VITALS — BP 175/92 | HR 86 | Ht 66.0 in | Wt 169.2 lb

## 2022-04-09 DIAGNOSIS — I10 Essential (primary) hypertension: Secondary | ICD-10-CM

## 2022-04-09 DIAGNOSIS — Z87891 Personal history of nicotine dependence: Secondary | ICD-10-CM

## 2022-04-09 DIAGNOSIS — K7460 Unspecified cirrhosis of liver: Secondary | ICD-10-CM

## 2022-04-09 DIAGNOSIS — Z1211 Encounter for screening for malignant neoplasm of colon: Secondary | ICD-10-CM

## 2022-04-09 MED ORDER — CARVEDILOL 12.5 MG PO TABS: 12.5000 mg | ORAL_TABLET | Freq: Two times a day (BID) | ORAL | 0 refills | Status: DC

## 2022-04-09 NOTE — Progress Notes (Unsigned)
   CC: HTN follow up  HPI:  [name] is a 66 y.o. with medical history of HTN, hypertriglyceridemia, gout, subclinical hypothyroidism presenting to [facility] for HTN follow up.   Please see problem-based list for further details, assessments, and plans.  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 Anemia panel in 03/2010 showing Iron 53 , TIBC 303 , iron saturation percentage 17, ferritin 197 , B12 and folate within normal limits.   Paronychia of toe, right 11/03/2018   Postmenopausal status    Subjective vision disturbance, left eye 03/17/2018   Tobacco abuse    Quit 2009, after approximately 15 pack year smoking history.   Umbilical hernia    S/P repair approximately 1995   Urine abnormality 05/13/2017     Current Outpatient Medications (Cardiovascular):    metoprolol tartrate (LOPRESSOR) 100 MG tablet, Take 1 tablet by mouth twice daily   olmesartan (BENICAR) 40 MG tablet, Take 1 tablet (40 mg total) by mouth daily.  Current Outpatient Medications (Respiratory):    fluticasone (FLONASE) 50 MCG/ACT nasal spray, Place 1 spray into both nostrils daily for 7 days.  Current Outpatient Medications (Analgesics):    naproxen (NAPROSYN) 500 MG tablet, TAKE 1/2 TABLET ('250MG'$  TOTAL) BY MOUTH EVERY 8 HOURS AS NEEDED. TAKE 1 AND 1/2 TABLETS INITIALLY WHEN YOUR GOUT FLARE STARTS. THEN TAKE 1/2 TABLET EVERY 8 HOURS AS NEEDED.   Current Outpatient Medications (Other):    fish oil-omega-3 fatty acids 1000 MG capsule, Take 2 capsules (2 g total) by mouth daily.   Menthol-Methyl  Salicylate (MUSCLE RUB) 10-15 % CREA, Apply 1 application topically as needed for muscle pain.   Multiple Vitamin (MULTIVITAMIN WITH MINERALS) TABS tablet, Take 1 tablet by mouth daily.  Review of Systems:  Review of system negative unless stated in the problem list or HPI.    Physical Exam:  Vitals:   04/09/22 1401  BP: (!) 197/102  Pulse: 89  SpO2: 100%  Weight: 169 lb 3.2 oz (76.7 kg)  Height: '5\' 6"'$  (1.676 m)    Physical Exam General: NAD HENT: NCAT Lungs: CTAB, no wheeze, rhonchi or rales.  Cardiovascular: Normal heart sounds, no r/m/g, 2+ pulses in all extremities. No LE edema Abdomen: No TTP, normal bowel sounds MSK: No asymmetry or muscle atrophy.  Skin: no lesions noted on exposed skin Neuro: Alert and oriented x4. CN grossly intact Psych: Normal mood and normal affect   Assessment & Plan:   No problem-specific Assessment & Plan notes found for this encounter.   See Encounters Tab for problem based charting.  Patient discussed with Dr. {NAMES:[id number]::"Guilloud","Hoffman","Mullen","Narendra","Vincent","Machen","Lau","Hatcher"} [name], MD Tillie Rung. [facility], PGY-2   HTN follow up Lopressor 100 mg BID, Olmesartan 40 mg qd Normal renal fxn 08/2021 BP elevated 197/102 140s-150s/80s  Does not want to take metoprolol as it causes her. States it makes her lightheaded. Unable to specify when it started.

## 2022-04-09 NOTE — Patient Instructions (Signed)
Jenna May, it was a pleasure seeing you today! You endorsed feeling well today. Below are some of the things we talked about this visit. We look forward to seeing you in the follow up appointment!  Today we discussed: For your blood pressure Continue Olmesartan 40 mg daily and start Coreg 12.5 mg twice daily. We will discontinue your metoprolol. We will follow up on your blood pressure in 2 weeks. We will add medications as needed to control your blood pressure. Don't take any NSIADs including ibuprofen, aleve or goody's powder as that can cause your blood pressure to go up.   I have ordered the following labs today:   Lab Orders         Fecal occult blood, imunochemical       Referrals ordered today:   Referral Orders  No referral(s) requested today     I have ordered the following medication/changed the following medications:   Stop the following medications: Medications Discontinued During This Encounter  Medication Reason   metoprolol tartrate (LOPRESSOR) 100 MG tablet Discontinued by provider     Start the following medications: Meds ordered this encounter  Medications   carvedilol (COREG) 12.5 MG tablet    Sig: Take 1 tablet (12.5 mg total) by mouth 2 (two) times daily.    Dispense:  60 tablet    Refill:  0     Follow-up: 2 week telehealth and one month follow up  Please make sure to arrive 15 minutes prior to your next appointment. If you arrive late, you may be asked to reschedule.   We look forward to seeing you next time. Please call our clinic at [phone] if you have any questions or concerns. The best time to call is Monday-Friday from 9am-4pm, but there is someone available 24/7. If after hours or the weekend, call the main hospital number and ask for the Internal Medicine Resident On-Call. If you need medication refills, please notify your pharmacy one week in advance and they will send [place] a request.  Thank you for letting us take part in your  care. Wishing you the best!  Thank you, [name], MD

## 2022-04-12 NOTE — Assessment & Plan Note (Signed)
Pt has HTN and is on Lopressor 100 mg BID, Olmesartan 40 mg qd. BP elevated in the clinic 175/92. Reports better home readings. She states highest reading seen at home is 140s-150s/80s. She has cut her metoprolol in half and is taking 50 mg BID due to complaint of dizziness which she attributes to this medications. We will change her Metoprolol to Coreg 12. 5 BID. Continue Olmesartan 40 mg qd.  -Follow up in 2 weeks for telehealth and 1 month in person.

## 2022-04-12 NOTE — Assessment & Plan Note (Signed)
>>  ASSESSMENT AND PLAN FOR ELEVATED LIVER ENZYMES WRITTEN ON 04/12/2022 12:35 AM BY FERNAND PROST, MD  Patient does not want to work this up further. States she is in the process of getting her orange card paperwork filled out. She currently drinks 1-2 glasses of wine nightly. Advised to cut all alcohol as her liver is inflamed -GI referral once pt has filled out orange card paper work.

## 2022-04-12 NOTE — Assessment & Plan Note (Signed)
Patient does not want to work this up further. States she is in the process of getting her orange card paperwork filled out. She currently drinks 1-2 glasses of wine nightly. Advised to cut all alcohol as her liver is inflamed -GI referral once pt has filled out orange card paper work.

## 2022-04-16 ENCOUNTER — Other Ambulatory Visit: Payer: Self-pay

## 2022-04-16 DIAGNOSIS — Z1211 Encounter for screening for malignant neoplasm of colon: Secondary | ICD-10-CM

## 2022-04-18 LAB — FECAL OCCULT BLOOD, IMMUNOCHEMICAL: Fecal Occult Bld: NEGATIVE

## 2022-04-20 NOTE — Progress Notes (Signed)
Internal Medicine Clinic Attending  Case discussed with Dr. Khan  at the time of the visit.  We reviewed the resident's history and exam and pertinent patient test results.  I agree with the assessment, diagnosis, and plan of care documented in the resident's note.  

## 2022-05-05 ENCOUNTER — Other Ambulatory Visit: Payer: Self-pay | Admitting: Internal Medicine

## 2022-05-05 DIAGNOSIS — I10 Essential (primary) hypertension: Secondary | ICD-10-CM

## 2022-05-26 ENCOUNTER — Other Ambulatory Visit: Payer: Self-pay

## 2022-05-26 ENCOUNTER — Ambulatory Visit (INDEPENDENT_AMBULATORY_CARE_PROVIDER_SITE_OTHER): Payer: Self-pay | Admitting: Student

## 2022-05-26 ENCOUNTER — Other Ambulatory Visit (HOSPITAL_COMMUNITY): Payer: Self-pay

## 2022-05-26 ENCOUNTER — Encounter: Payer: Self-pay | Admitting: Student

## 2022-05-26 VITALS — BP 191/77 | HR 63 | Temp 99.7°F | Resp 32 | Ht 66.0 in | Wt 169.5 lb

## 2022-05-26 DIAGNOSIS — Z87891 Personal history of nicotine dependence: Secondary | ICD-10-CM

## 2022-05-26 DIAGNOSIS — I10 Essential (primary) hypertension: Secondary | ICD-10-CM

## 2022-05-26 DIAGNOSIS — Z23 Encounter for immunization: Secondary | ICD-10-CM

## 2022-05-26 DIAGNOSIS — R7401 Elevation of levels of liver transaminase levels: Secondary | ICD-10-CM

## 2022-05-26 DIAGNOSIS — Z0000 Encounter for general adult medical examination without abnormal findings: Secondary | ICD-10-CM

## 2022-05-26 MED ORDER — SPIRONOLACTONE 25 MG PO TABS
25.0000 mg | ORAL_TABLET | Freq: Every day | ORAL | 11 refills | Status: DC
  Filled 2022-05-26: qty 30, 30d supply, fill #0
  Filled 2022-06-26: qty 30, 30d supply, fill #1
  Filled 2022-07-30: qty 30, 30d supply, fill #2
  Filled 2022-08-31: qty 30, 30d supply, fill #3
  Filled 2022-09-28: qty 30, 30d supply, fill #4
  Filled 2022-10-29: qty 30, 30d supply, fill #5
  Filled 2022-11-27: qty 30, 30d supply, fill #6
  Filled 2022-12-28: qty 30, 30d supply, fill #7
  Filled 2023-02-02: qty 30, 30d supply, fill #8
  Filled 2023-03-08: qty 30, 30d supply, fill #9
  Filled 2023-04-12: qty 30, 30d supply, fill #10

## 2022-05-26 NOTE — Assessment & Plan Note (Addendum)
    05/26/2022    3:02 PM 05/26/2022    2:58 PM 05/26/2022    2:55 PM  Vitals  Systolic 060 156 153  Diastolic 77 83 83  Pulse 63 62 63   Patient presents for BP follow-up. BP remains elevated on repeat. She is on Coreg 12.5 mg twice daily and olmesartan 40 mg daily. Previously on metoprolol but was stopped due to dizziness. History of gingival hyperplasia with amlodipine and gout flares with HCTZ.  She is tolerating Coreg and olmesartan with no side effects.  She denies any headache, blurry vision, n/v, shortness of breath or chest pain. She was upset earlier because her orange card application was declined. She brought her home BP cuff, which measured similarly to clinic readings. Reports home BP is around 130-140/80 which is still not controlled. Patient agrees to start third agent, discussed healthy eating habits and weight loss.  Plan -Start spironolactone 25 mg daily -Continue Coreg and olmesartan -CMP today -Advised patient to record BP log at home, follow-up in a month for recheck

## 2022-05-26 NOTE — Patient Instructions (Addendum)
Thank you, Ms.Keishawna Cavanagh for allowing [place] to provide your care today.   -Your blood pressure today was high. We will start Spironolactone 25 mg daily.  Please continue your olmesartan and carvedilol.  I have sent this to the [place]. -Referral sent to OB/GYN for mammogram screening. -Blood work today to check electrolytes and kidney function.  -Received flu shot today.   I have ordered the following labs for you:  Lab Orders         CMP14 + Anion Gap       Referrals ordered today:   Referral Orders         Ambulatory referral to Obstetrics / Gynecology       I have ordered the following medication/changed the following medications:   Stop the following medications: There are no discontinued medications.   Start the following medications: Meds ordered this encounter  Medications   spironolactone (ALDACTONE) 25 MG tablet    Sig: Take 1 tablet (25 mg total) by mouth daily.    Dispense:  30 tablet    Refill:  11    IM program     Follow up:  1 month    Should you have any questions or concerns please call the internal medicine clinic at [phone].    Angelique Blonder, D.O. [place]

## 2022-05-26 NOTE — Progress Notes (Signed)
CC: HTN follow-up  HPI:  [name] is a 66 y.o. female living with a history stated below and presents today for HTN follow-up. Please see problem based assessment and plan for additional details.  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 Anemia panel in 03/2010 showing Iron 53 , TIBC 303 , iron saturation percentage 17, ferritin 197 , B12 and folate within normal limits.   Paronychia of toe, right 11/03/2018   Postmenopausal status    Subjective vision disturbance, left eye 03/17/2018   Tobacco abuse    Quit 2009, after approximately 15 pack year smoking history.   Umbilical hernia    S/P repair approximately 1995   Urine abnormality 05/13/2017    Current Outpatient Medications on File Prior to Visit  Medication Sig Dispense Refill   carvedilol (COREG) 12.5 MG tablet Take 1 tablet by mouth twice daily 60 tablet 0   fish oil-omega-3 fatty acids 1000 MG capsule Take 2 capsules (2 g total) by mouth daily. 60 capsule 5   fluticasone (FLONASE) 50 MCG/ACT nasal spray Place 1 spray into both nostrils daily for 7 days. 1 g 0   Menthol-Methyl Salicylate (MUSCLE RUB) 10-15 % CREA Apply 1 application topically as needed for muscle pain.     Multiple Vitamin (MULTIVITAMIN WITH MINERALS) TABS tablet Take 1 tablet by mouth daily.     naproxen (NAPROSYN) 500 MG tablet TAKE 1/2 TABLET ('250MG'$  TOTAL) BY MOUTH EVERY 8 HOURS AS NEEDED. TAKE 1 AND 1/2 TABLETS INITIALLY WHEN YOUR GOUT FLARE STARTS. THEN TAKE 1/2 TABLET EVERY 8 HOURS AS NEEDED. 20 tablet 0   olmesartan (BENICAR) 40 MG  tablet Take 1 tablet (40 mg total) by mouth daily. 30 tablet 11   No current facility-administered medications on file prior to visit.   Review of Systems: ROS negative except for what is noted on the assessment and plan.  Vitals:   05/26/22 1455 05/26/22 1458 05/26/22 1502  BP: (!) 194/83 (!) 193/83 (!) 191/77  Pulse: 63 62 63  Resp: (!) 32    Temp: 99.7 F (37.6 C)    TempSrc: Oral    SpO2: 100%    Weight: 169 lb 8 oz (76.9 kg)    Height: '5\' 6"'$  (1.676 m)     Physical Exam: Constitutional: well-appearing female, sitting in chair, in no acute distress HENT: normocephalic atraumatic Neck: supple Cardiovascular: regular rate and rhythm, no m/r/g Pulmonary/Chest: normal work of breathing on room air MSK: normal bulk and tone Neurological: alert & oriented x 3 Skin: warm and dry Psych: normal mood and behavior  Assessment & Plan:   Essential hypertension    05/26/2022    3:02 PM 05/26/2022    2:58 PM 05/26/2022    2:55 PM  Vitals  Systolic 416 606 301  Diastolic 77 83 83  Pulse 63 62 63   Patient presents for BP follow-up. BP remains elevated on repeat. She is on Coreg 12.5 mg twice daily and olmesartan 40 mg daily. Previously on metoprolol but  was stopped due to dizziness. History of gingival hyperplasia with amlodipine and gout flares with HCTZ.  She is tolerating Coreg and olmesartan with no side effects.  She denies any headache, blurry vision, n/v, shortness of breath or chest pain. She was upset earlier because her orange card application was declined. She brought her home BP cuff, which measured similarly to clinic readings. Reports home BP is around 130-140/80 which is still not controlled. Patient agrees to start third agent, discussed healthy eating habits and weight loss.  Plan -Start spironolactone 25 mg daily -Continue Coreg and olmesartan -CMP today -Advised patient to record BP log at home, follow-up in a month for recheck  Preventative health  care Patient received flu shot today. We discussed mammogram screening, will send referral to OB/GYN to apply for BCCCP since she is uninsured.   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 She was supposed to get RUQ ultrasound with GI referral when she received her orange card. However she was denied the orange card. Last LFTs were downtrending. She reports drinking 1-2 glasses of wine a night. She is willing to reduce it to 1 glass per night.   Plan -CMP today -patient plans to apply for medicare next year    Patient seen with Dr. Alvin Critchley, D.O. [place], PGY-1 Phone: [phone] Date 05/26/2022 Time 7:23 PM

## 2022-05-26 NOTE — Assessment & Plan Note (Addendum)
Patient with hx of transaminitis which was thought to be secondary to simvastatin use. She was supposed to get RUQ ultrasound with GI referral when she received her orange card. However she was denied the orange card. Last LFTs were downtrending. She reports drinking 1-2 glasses of wine a night. She is willing to reduce it to 1 glass per night.   Plan -CMP today -patient plans to apply for medicare next year

## 2022-05-26 NOTE — Assessment & Plan Note (Addendum)
Patient received flu shot today. We discussed mammogram screening, will send referral to OB/GYN to apply for BCCCP since she is uninsured.   Plan -Sent referral to OB/GYN for BCCCP

## 2022-05-27 LAB — CMP14 + ANION GAP
ALT: 126 [IU]/L — ABNORMAL HIGH (ref 0–32)
AST: 195 [IU]/L — ABNORMAL HIGH (ref 0–40)
Albumin/Globulin Ratio: 1 — ABNORMAL LOW (ref 1.2–2.2)
Albumin: 3.8 g/dL — ABNORMAL LOW (ref 3.9–4.9)
Alkaline Phosphatase: 219 [IU]/L — ABNORMAL HIGH (ref 44–121)
Anion Gap: 17 mmol/L (ref 10.0–18.0)
BUN/Creatinine Ratio: 18 (ref 12–28)
BUN: 14 mg/dL (ref 8–27)
Bilirubin Total: 0.4 mg/dL (ref 0.0–1.2)
CO2: 19 mmol/L — ABNORMAL LOW (ref 20–29)
Calcium: 9.3 mg/dL (ref 8.7–10.3)
Chloride: 98 mmol/L (ref 96–106)
Creatinine, Ser: 0.77 mg/dL (ref 0.57–1.00)
Globulin, Total: 4 g/dL (ref 1.5–4.5)
Glucose: 96 mg/dL (ref 70–99)
Potassium: 3.8 mmol/L (ref 3.5–5.2)
Sodium: 134 mmol/L (ref 134–144)
Total Protein: 7.8 g/dL (ref 6.0–8.5)
eGFR: 86 mL/min/{1.73_m2}

## 2022-05-28 ENCOUNTER — Other Ambulatory Visit (HOSPITAL_COMMUNITY): Payer: Self-pay

## 2022-05-28 NOTE — Progress Notes (Signed)
Internal Medicine Clinic Attending ° °Case discussed with Dr. Atway  At the time of the visit.  We reviewed the resident’s history and exam and pertinent patient test results.  I agree with the assessment, diagnosis, and plan of care documented in the resident’s note.  °

## 2022-06-04 ENCOUNTER — Other Ambulatory Visit: Payer: Self-pay | Admitting: Internal Medicine

## 2022-06-04 DIAGNOSIS — I10 Essential (primary) hypertension: Secondary | ICD-10-CM

## 2022-06-12 ENCOUNTER — Telehealth: Payer: Self-pay

## 2022-06-12 NOTE — Telephone Encounter (Signed)
Telephoned patient at mobile number. Left a voice message with BCCCP (scholarship) contact information. 

## 2022-06-16 ENCOUNTER — Other Ambulatory Visit: Payer: Self-pay | Admitting: Internal Medicine

## 2022-06-16 DIAGNOSIS — Z1231 Encounter for screening mammogram for malignant neoplasm of breast: Secondary | ICD-10-CM

## 2022-06-25 ENCOUNTER — Other Ambulatory Visit: Payer: Self-pay

## 2022-06-25 ENCOUNTER — Other Ambulatory Visit (HOSPITAL_COMMUNITY): Payer: Self-pay

## 2022-06-25 ENCOUNTER — Ambulatory Visit (INDEPENDENT_AMBULATORY_CARE_PROVIDER_SITE_OTHER): Payer: Self-pay | Admitting: Internal Medicine

## 2022-06-25 ENCOUNTER — Other Ambulatory Visit: Payer: Self-pay | Admitting: Internal Medicine

## 2022-06-25 ENCOUNTER — Encounter: Payer: Self-pay | Admitting: Internal Medicine

## 2022-06-25 ENCOUNTER — Ambulatory Visit
Admission: RE | Admit: 2022-06-25 | Discharge: 2022-06-25 | Disposition: A | Discharge status: Home / Self Care | Payer: No Typology Code available for payment source | Source: Ambulatory Visit | Attending: Internal Medicine | Admitting: Internal Medicine

## 2022-06-25 VITALS — BP 130/84 | HR 70 | Temp 98.1°F | Ht 66.0 in | Wt 163.4 lb

## 2022-06-25 DIAGNOSIS — Z0000 Encounter for general adult medical examination without abnormal findings: Secondary | ICD-10-CM

## 2022-06-25 DIAGNOSIS — I10 Essential (primary) hypertension: Secondary | ICD-10-CM

## 2022-06-25 DIAGNOSIS — Z1231 Encounter for screening mammogram for malignant neoplasm of breast: Secondary | ICD-10-CM

## 2022-06-25 DIAGNOSIS — Z87891 Personal history of nicotine dependence: Secondary | ICD-10-CM

## 2022-06-25 NOTE — Patient Instructions (Signed)
Thank you, [name] for allowing [place] to provide your care today. Today we discussed:  High blood pressure: Your blood pressure is looking a lot better! Continue to take coreg 12.5 mg twice a day, olmesartan 40 mg once a day, and spironolactone 25 mg once a day. We are checking your potassium level today and I will call you if we need to make any medication changes.  I have ordered the following labs for you:   Lab Orders         BMP8+Anion Gap       Referrals ordered today:   Referral Orders  No referral(s) requested today     I have ordered the following medication/changed the following medications:   Stop the following medications: There are no discontinued medications.   Start the following medications: No orders of the defined types were placed in this encounter.    Follow up: 3-4 months    Should you have any questions or concerns please call the internal medicine clinic at [phone].     Buddy Duty, D.O. [place]

## 2022-06-25 NOTE — Assessment & Plan Note (Addendum)
Patient presents to the [facility] for 1 month follow-up of her hypertension.  At the last office visit, her blood pressure was elevated to 193/83 and 191/77 upon repeat.  She was advised to continue taking her Coreg 12.5 mg twice daily and olmesartan 40 mg daily, and was also started on spironolactone 25 mg daily.  Of note, the patient cannot tolerate HCTZ as she gets gout flares and has not been able to tolerate amlodipine due to a history of gingival hyperplasia.  Blood pressure today was initially elevated to 152/82 and improved to 130/84 upon recheck. Home BP log significant for SBP 126-141 and DBP in 70s.  Patient denies any headaches, vision changes, chest pain, or dyspnea.  She states that she has been doing well with her new blood pressure medication and is not having any issues at this time.  Plan: -Continue Coreg 12.5 mg twice daily -Continue olmesartan 40 mg daily -Continue spironolactone 25 mg daily -BMP today to check potassium level -Follow up in ~3 months for BP recheck

## 2022-06-25 NOTE — Assessment & Plan Note (Addendum)
Mammogram performed today, results pending. Colonoscopy deferred as patient does annual FOBT screening, all of which have been negative

## 2022-06-25 NOTE — Progress Notes (Signed)
CC: 1 month follow up HTN  HPI:  [name] is a 66 y.o. female living with a history stated below and presents today for a follow up of hypertension. Please see problem based assessment and plan for additional details.  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 Anemia panel in 03/2010 showing Iron 53 , TIBC 303 , iron saturation percentage 17, ferritin 197 , B12 and folate within normal limits.   Paronychia of toe, right 11/03/2018   Postmenopausal status    Subjective vision disturbance, left eye 03/17/2018   Tobacco abuse    Quit 2009, after approximately 15 pack year smoking history.   Umbilical hernia    S/P repair approximately 1995   Urine abnormality 05/13/2017    Current Outpatient Medications on File Prior to Visit  Medication Sig Dispense Refill   carvedilol (COREG) 12.5 MG tablet Take 1 tablet by mouth twice daily 60 tablet 0   fish oil-omega-3 fatty acids 1000 MG capsule Take 2 capsules (2 g total) by mouth daily. 60 capsule 5   fluticasone (FLONASE) 50 MCG/ACT nasal spray Place 1 spray into both nostrils daily for 7 days. 1 g 0   Menthol-Methyl Salicylate (MUSCLE RUB) 10-15 % CREA Apply 1 application topically as needed for muscle pain.     Multiple Vitamin (MULTIVITAMIN WITH MINERALS) TABS tablet Take 1 tablet by mouth daily.     naproxen (NAPROSYN) 500 MG tablet TAKE 1/2 TABLET ('250MG'$  TOTAL) BY MOUTH EVERY 8 HOURS AS NEEDED. TAKE 1 AND 1/2 TABLETS INITIALLY WHEN YOUR GOUT FLARE STARTS. THEN TAKE 1/2 TABLET EVERY 8 HOURS AS NEEDED. 20 tablet 0    olmesartan (BENICAR) 40 MG tablet Take 1 tablet (40 mg total) by mouth daily. 30 tablet 11   spironolactone (ALDACTONE) 25 MG tablet Take 1 tablet (25 mg total) by mouth daily. 30 tablet 11   No current facility-administered medications on file prior to visit.    Family History  Problem Relation Age of Onset   Hypertension Mother    Diabetes Father    Hypertension Father    Hypertension Sister    Heart attack Brother 80   Breast cancer Neg Hx     Social History   Socioeconomic History   Marital status: Single    Spouse name: Not on file   Number of children: 0   Years of education: 12th grade   Highest education level: High school graduate  Occupational History   Occupation: Equities trader: UNEMPLOYED  Tobacco Use   Smoking status: Former    Packs/day: 0.40    Years: 30.00    Total pack years: 12.00    Types: Cigarettes    Quit date: 04/11/2008    Years since quitting: 14.2   Smokeless tobacco: Never  Vaping Use   Vaping Use: Never used  Substance and Sexual Activity   Alcohol use: Yes    Alcohol/week: 0.0 standard drinks of alcohol    Comment: occassional glass of wine   Drug  use: No   Sexual activity: Not Currently    Birth control/protection: Surgical  Other Topics Concern   Not on file  Social History Narrative   Unemployed. Intermittently worked, Museum/gallery curator, Doctor, hospital.    Graduated from high school, [employer].   Single.   No children.   Orange card.   Social Determinants of Health   Financial Resource Strain: Low Risk  (06/25/2022)   Overall Financial Resource Strain (CARDIA)    Difficulty of Paying Living Expenses: Not hard at all  Food Insecurity: No Food Insecurity (06/25/2022)   Hunger Vital Sign    Worried About Running Out of Food in the Last Year: Never true    Ran Out of Food in the Last Year: Never true  Transportation Needs: No Transportation Needs (06/25/2022)   PRAPARE - Radiographer, therapeutic (Medical): No    Lack of Transportation (Non-Medical): No  Physical Activity: Sufficiently Active (06/25/2022)   Exercise Vital Sign    Days of Exercise per Week: 7 days    Minutes of Exercise per Session: 30 min  Stress: No Stress Concern Present (06/25/2022)   [place]    Feeling of Stress : Not at all  Social Connections: Moderately Isolated (06/25/2022)   Social Connection and Isolation Panel [NHANES]    Frequency of Communication with Friends and Family: More than three times a week    Frequency of Social Gatherings with Friends and Family: Never    Attends Religious Services: More than 4 times per year    Active Member of [employer] or Organizations: No    Attends Archivist Meetings: Never    Marital Status: Never married  Intimate Partner Violence: Not At Risk (06/25/2022)   Humiliation, Afraid, Rape, and Kick questionnaire    Fear of Current or Ex-Partner: No    Emotionally Abused: No    Physically Abused: No    Sexually Abused: No    Review of Systems: ROS negative except for what is noted on the assessment and plan.  Vitals:   06/25/22 1450 06/25/22 1501  BP: (!) 152/82 130/84  Pulse: 78 70  Temp: 98.1 F (36.7 C)   TempSrc: Oral   SpO2: 100%   Weight: 163 lb 6.4 oz (74.1 kg)   Height: '5\' 6"'$  (1.676 m)     Physical Exam: Constitutional: well-appearing female sitting in chair, in no acute distress Cardiovascular: regular rate and rhythm, systolic murmur appreciated Pulmonary/Chest: normal work of breathing on room air, lungs clear to auscultation bilaterally MSK: normal bulk and tone Neurological: alert & oriented x 3, no focal deficit Skin: warm and dry Psych: normal mood and behavior  Assessment & Plan:   Patient discussed with Dr. Evette Doffing  Essential hypertension Patient presents to the [facility] for 1 month follow-up of her hypertension.  At the last office visit, her  blood pressure was elevated to 193/83 and 191/77 upon repeat.  She was advised to continue taking her Coreg 12.5 mg twice daily and olmesartan 40 mg daily, and was also started on spironolactone 25 mg daily.  Of note, the patient cannot tolerate HCTZ as she gets gout flares and has not been able to tolerate amlodipine due to a history of gingival hyperplasia.  Blood pressure today was initially elevated to 152/82 and improved to 130/84 upon recheck. Home BP log significant for SBP 126-141 and DBP in 70s.  Patient denies any headaches, vision changes, chest  pain, or dyspnea.  She states that she has been doing well with her new blood pressure medication and is not having any issues at this time.  Plan: -Continue Coreg 12.5 mg twice daily -Continue olmesartan 40 mg daily -Continue spironolactone 25 mg daily -BMP today to check potassium level -Follow up in ~3 months for BP recheck  Preventative health care Mammogram performed today, results pending. Colonoscopy deferred as patient does annual FOBT screening, all of which have been negative   Shamara Soza, D.O. [facility], PGY-2 Phone: [phone] Date 06/25/2022 Time 3:24 PM

## 2022-06-26 ENCOUNTER — Other Ambulatory Visit (HOSPITAL_COMMUNITY): Payer: Self-pay

## 2022-06-26 NOTE — Progress Notes (Signed)
Internal Medicine Clinic Attending ° °Case discussed with Dr. Atway  At the time of the visit.  We reviewed the resident’s history and exam and pertinent patient test results.  I agree with the assessment, diagnosis, and plan of care documented in the resident’s note.  °

## 2022-06-27 LAB — BMP8+ANION GAP
Anion Gap: 21 mmol/L — ABNORMAL HIGH (ref 10.0–18.0)
BUN/Creatinine Ratio: 30 — ABNORMAL HIGH (ref 12–28)
BUN: 28 mg/dL — ABNORMAL HIGH (ref 8–27)
CO2: 14 mmol/L — ABNORMAL LOW (ref 20–29)
Calcium: 9.3 mg/dL (ref 8.7–10.3)
Chloride: 86 mmol/L — ABNORMAL LOW (ref 96–106)
Creatinine, Ser: 0.93 mg/dL (ref 0.57–1.00)
Glucose: 77 mg/dL (ref 70–99)
Potassium: 5 mmol/L (ref 3.5–5.2)
Sodium: 121 mmol/L — ABNORMAL LOW (ref 134–144)
eGFR: 69 mL/min/{1.73_m2} (ref >59)

## 2022-06-29 ENCOUNTER — Other Ambulatory Visit: Payer: Self-pay | Admitting: Internal Medicine

## 2022-06-29 DIAGNOSIS — I10 Essential (primary) hypertension: Secondary | ICD-10-CM

## 2022-06-29 NOTE — Progress Notes (Signed)
K level within normal limits at 5.0, up from 3.8 one month ago since starting spironolactone. Will have patient return in ~4 weeks for lab only visit to recheck BMP. Patient called and made aware.

## 2022-06-30 ENCOUNTER — Other Ambulatory Visit (HOSPITAL_COMMUNITY): Payer: Self-pay

## 2022-07-06 ENCOUNTER — Other Ambulatory Visit: Payer: Self-pay | Admitting: Student

## 2022-07-06 DIAGNOSIS — I10 Essential (primary) hypertension: Secondary | ICD-10-CM

## 2022-07-30 ENCOUNTER — Other Ambulatory Visit: Payer: Medicaid Other

## 2022-07-30 ENCOUNTER — Other Ambulatory Visit: Payer: Self-pay

## 2022-07-30 DIAGNOSIS — I10 Essential (primary) hypertension: Secondary | ICD-10-CM

## 2022-07-31 LAB — BMP8+ANION GAP
Anion Gap: 17 mmol/L (ref 10.0–18.0)
BUN/Creatinine Ratio: 20 (ref 12–28)
BUN: 19 mg/dL (ref 8–27)
CO2: 17 mmol/L — ABNORMAL LOW (ref 20–29)
Calcium: 9.9 mg/dL (ref 8.7–10.3)
Chloride: 90 mmol/L — ABNORMAL LOW (ref 96–106)
Creatinine, Ser: 0.97 mg/dL (ref 0.57–1.00)
Glucose: 89 mg/dL (ref 70–99)
Potassium: 4.3 mmol/L (ref 3.5–5.2)
Sodium: 124 mmol/L — ABNORMAL LOW (ref 134–144)
eGFR: 65 mL/min/{1.73_m2} (ref >59)

## 2022-08-31 ENCOUNTER — Other Ambulatory Visit (HOSPITAL_COMMUNITY): Payer: Self-pay

## 2022-09-01 ENCOUNTER — Other Ambulatory Visit (HOSPITAL_COMMUNITY): Payer: Self-pay

## 2022-09-17 ENCOUNTER — Other Ambulatory Visit: Payer: Self-pay | Admitting: Internal Medicine

## 2022-09-17 DIAGNOSIS — I10 Essential (primary) hypertension: Secondary | ICD-10-CM

## 2022-10-07 ENCOUNTER — Encounter: Payer: No Typology Code available for payment source | Admitting: Internal Medicine

## 2022-10-19 ENCOUNTER — Encounter: Payer: No Typology Code available for payment source | Admitting: Obstetrics and Gynecology

## 2022-10-30 ENCOUNTER — Other Ambulatory Visit (HOSPITAL_COMMUNITY): Payer: Self-pay

## 2022-11-30 ENCOUNTER — Other Ambulatory Visit (HOSPITAL_COMMUNITY): Payer: Self-pay

## 2023-01-05 ENCOUNTER — Other Ambulatory Visit (HOSPITAL_COMMUNITY): Payer: Self-pay

## 2023-01-14 ENCOUNTER — Other Ambulatory Visit: Payer: Self-pay | Admitting: Internal Medicine

## 2023-01-14 DIAGNOSIS — I10 Essential (primary) hypertension: Secondary | ICD-10-CM

## 2023-01-19 NOTE — Telephone Encounter (Signed)
Contacted patient to schedule an appointment.  Did not want to schedule an appointment at this time due to lack of insurance.  But I informed the patient that Epic is showing that she has medicaid.  Gave her two telephone numbers to contact DSS medicaid to make sure what I am seeing in Epic is correct.  Asked to call back to schedule an appointment if her medicaid is indeed currently active.

## 2023-02-18 ENCOUNTER — Encounter: Payer: No Typology Code available for payment source | Admitting: Student

## 2023-03-08 ENCOUNTER — Other Ambulatory Visit (HOSPITAL_COMMUNITY): Payer: Self-pay

## 2023-03-23 ENCOUNTER — Telehealth: Payer: Self-pay | Admitting: Internal Medicine

## 2023-03-23 MED ORDER — NAPROXEN 500 MG PO TABS: 500.0000 mg | ORAL_TABLET | Freq: Two times a day (BID) | ORAL | 0 refills | Status: DC | PRN

## 2023-03-23 NOTE — Telephone Encounter (Signed)
Called patient and discussed symptoms. Refilled her naproxen with a 14 day supply, gave her instructions to take it PRN until her next clinic appt.

## 2023-03-23 NOTE — Telephone Encounter (Signed)
Pt requesting to fill her Naproxen Medication that she has not had filled in  a while.  Pt is having a Gout flare up.  Pt having problems walking, lifting her hands, knees are swollen, hands swollen and is unable to even comb her hair.  Please call the patient back.   [id number] - [place] ([place]), [place] - [address] (Ph: [phone])

## 2023-04-01 ENCOUNTER — Encounter: Payer: No Typology Code available for payment source | Admitting: Student

## 2023-04-01 ENCOUNTER — Other Ambulatory Visit: Payer: Self-pay

## 2023-04-02 ENCOUNTER — Telehealth: Payer: Self-pay | Admitting: Internal Medicine

## 2023-04-02 MED ORDER — NAPROXEN 500 MG PO TABS: 500.0000 mg | ORAL_TABLET | Freq: Two times a day (BID) | ORAL | 0 refills | Status: DC | PRN

## 2023-04-02 NOTE — Telephone Encounter (Signed)
Hey Dr.Atway, you sent the rx to an alternative pharmacy. The rx was supposed to be sent to [facility] please resend rx thanks!

## 2023-04-02 NOTE — Telephone Encounter (Signed)
Open in Error.

## 2023-04-05 ENCOUNTER — Other Ambulatory Visit: Payer: Self-pay | Admitting: Internal Medicine

## 2023-04-05 MED ORDER — NAPROXEN 500 MG PO TABS: 500.0000 mg | ORAL_TABLET | Freq: Two times a day (BID) | ORAL | 0 refills | Status: AC | PRN

## 2023-04-12 ENCOUNTER — Other Ambulatory Visit (HOSPITAL_COMMUNITY): Payer: Self-pay

## 2023-04-29 ENCOUNTER — Other Ambulatory Visit (HOSPITAL_COMMUNITY): Payer: Self-pay

## 2023-04-29 ENCOUNTER — Other Ambulatory Visit: Payer: Self-pay

## 2023-04-29 ENCOUNTER — Ambulatory Visit: Payer: Medicare Other | Admitting: Internal Medicine

## 2023-04-29 ENCOUNTER — Ambulatory Visit: Payer: Medicare Other

## 2023-04-29 ENCOUNTER — Encounter: Payer: Self-pay | Admitting: Internal Medicine

## 2023-04-29 VITALS — BP 153/60 | HR 61 | Temp 98.4°F | Ht 66.0 in | Wt 160.9 lb

## 2023-04-29 VITALS — BP 153/60 | HR 64 | Temp 98.6°F | Ht 66.0 in | Wt 160.9 lb

## 2023-04-29 DIAGNOSIS — Z23 Encounter for immunization: Secondary | ICD-10-CM

## 2023-04-29 DIAGNOSIS — Z1211 Encounter for screening for malignant neoplasm of colon: Secondary | ICD-10-CM

## 2023-04-29 DIAGNOSIS — I10 Essential (primary) hypertension: Secondary | ICD-10-CM

## 2023-04-29 DIAGNOSIS — Z0000 Encounter for general adult medical examination without abnormal findings: Secondary | ICD-10-CM | POA: Diagnosis not present

## 2023-04-29 DIAGNOSIS — E781 Pure hyperglyceridemia: Secondary | ICD-10-CM

## 2023-04-29 DIAGNOSIS — E871 Hypo-osmolality and hyponatremia: Secondary | ICD-10-CM

## 2023-04-29 MED ORDER — OLMESARTAN MEDOXOMIL 40 MG PO TABS
40.0000 mg | ORAL_TABLET | Freq: Every day | ORAL | 3 refills | Status: DC
Start: 2023-04-29 — End: 2023-09-02

## 2023-04-29 MED ORDER — SPIRONOLACTONE 25 MG PO TABS
25.0000 mg | ORAL_TABLET | Freq: Every day | ORAL | 11 refills | Status: DC
Start: 2023-04-29 — End: 2023-06-14
  Filled 2023-04-29 – 2023-05-17 (×2): qty 30, 30d supply, fill #0

## 2023-04-29 MED ORDER — CARVEDILOL 12.5 MG PO TABS
12.5000 mg | ORAL_TABLET | Freq: Two times a day (BID) | ORAL | 11 refills | Status: AC
Start: 2023-04-29 — End: ?

## 2023-04-29 NOTE — Patient Instructions (Signed)

## 2023-04-29 NOTE — Patient Instructions (Signed)
Thank you, Ms.Eleyna Nygren for allowing [place] to provide your care today. Today we discussed:  High blood pressure Take olmesartan 40 mg once a day Take spironolactone 25 mg once a day Take carvedilol 12.5 mg TWICE a day - this medicine works best if you take it twice, not just once High cholesterol We are checking your cholesterol today Flu shot given today Please bring in your stool sample for the colon cancer screening test  I have ordered the following labs for you:   Lab Orders         Fecal occult blood, imunochemical         Lipid Profile         CMP14 + Anion Gap       Referrals ordered today:   Referral Orders  No referral(s) requested today     I have ordered the following medication/changed the following medications:   Stop the following medications: Medications Discontinued During This Encounter  Medication Reason   spironolactone (ALDACTONE) 25 MG tablet Reorder   carvedilol (COREG) 12.5 MG tablet Reorder   olmesartan (BENICAR) 40 MG tablet Reorder     Start the following medications: Meds ordered this encounter  Medications   spironolactone (ALDACTONE) 25 MG tablet    Sig: Take 1 tablet (25 mg total) by mouth daily.    Dispense:  30 tablet    Refill:  11    IM program   olmesartan (BENICAR) 40 MG tablet    Sig: Take 1 tablet (40 mg total) by mouth daily.    Dispense:  30 tablet    Refill:  3   carvedilol (COREG) 12.5 MG tablet    Sig: Take 1 tablet (12.5 mg total) by mouth 2 (two) times daily.    Dispense:  60 tablet    Refill:  11     Follow up:  ~6 weeks for high blood pressure      Should you have any questions or concerns please call the internal medicine clinic at [phone].     Elza Rafter, D.O. [facility]

## 2023-04-29 NOTE — Progress Notes (Signed)
CC: hypertension  HPI:  [name] is a 67 y.o. female living with a history stated below and presents today for a follow up of her hypertension. Please see problem based assessment and plan for additional details.  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 Anemia panel in 03/2010 showing Iron 53 , TIBC 303 , iron saturation percentage 17, ferritin 197 , B12 and folate within normal limits.   Paronychia of toe, right 11/03/2018   Postmenopausal status    Subjective vision disturbance, left eye 03/17/2018   Tobacco abuse    Quit 2009, after approximately 15 pack year smoking history.   Umbilical hernia    S/P repair approximately 1995   Urine abnormality 05/13/2017    Current Outpatient Medications on File Prior to Visit  Medication Sig Dispense Refill   fish oil-omega-3 fatty acids 1000 MG capsule Take 2 capsules (2 g total) by mouth daily. 60 capsule 5   fluticasone (FLONASE) 50 MCG/ACT nasal spray Place 1 spray into both nostrils daily for 7 days. 1 g 0   Menthol-Methyl Salicylate (MUSCLE RUB) 10-15 % CREA Apply 1 application topically as needed for muscle pain.     Multiple Vitamin (MULTIVITAMIN WITH MINERALS) TABS tablet Take 1 tablet by mouth daily.     No current facility-administered medications on file prior to visit.    Family History  Problem Relation Age of Onset   Hypertension Mother    Diabetes Father    Hypertension Father    Hypertension Sister    Heart attack Brother 4   Breast cancer Neg Hx     Social History   Socioeconomic History    Marital status: Single    Spouse name: Not on file   Number of children: 0   Years of education: 12th grade   Highest education level: High school graduate  Occupational History   Occupation: Copywriter, advertising: UNEMPLOYED  Tobacco Use   Smoking status: Former    Current packs/day: 0.00    Average packs/day: 0.4 packs/day for 30.0 years (12.0 ttl pk-yrs)    Types: Cigarettes    Start date: 04/11/1978    Quit date: 04/11/2008    Years since quitting: 15.0   Smokeless tobacco: Never  Vaping Use   Vaping status: Never Used  Substance and Sexual Activity   Alcohol use: Yes    Alcohol/week: 0.0 standard drinks of alcohol    Comment: occassional glass of wine   Drug use: No   Sexual activity: Not Currently    Birth control/protection: Surgical  Other Topics Concern   Not on file  Social History Narrative   Unemployed. Intermittently worked, Engineer, mining, Copy.    Graduated from high school, [employer].   Single.   No children.   [place] card.   Social Determinants of Health   Financial Resource Strain: Low Risk  (04/29/2023)   Overall Financial Resource Strain (CARDIA)    Difficulty of Paying Living Expenses: Not hard at all  Food Insecurity: No Food Insecurity (04/29/2023)   Hunger Vital Sign    Worried About Running Out of Food in the Last Year: Never true    Ran Out of Food in the Last Year: Never true  Transportation Needs: No Transportation Needs (04/29/2023)   PRAPARE - Administrator, Civil Service (Medical): No    Lack of Transportation (Non-Medical): No  Physical Activity: Sufficiently Active (04/29/2023)   Exercise Vital Sign    Days of Exercise per Week: 7 days    Minutes of Exercise per Session: 30 min  Stress: No Stress Concern Present (04/29/2023)   [employer] of Occupational Health - Occupational Stress Questionnaire    Feeling of Stress : Not at all  Social Connections: Socially Isolated (04/29/2023)   Social  Connection and Isolation Panel [NHANES]    Frequency of Communication with Friends and Family: Once a week    Frequency of Social Gatherings with Friends and Family: Once a week    Attends Religious Services: More than 4 times per year    Active Member of [employer] or Organizations: No    Attends Banker Meetings: Never    Marital Status: Never married  Intimate Partner Violence: Not At Risk (04/29/2023)   Humiliation, Afraid, Rape, and Kick questionnaire    Fear of Current or Ex-Partner: No    Emotionally Abused: No    Physically Abused: No    Sexually Abused: No    Review of Systems: ROS negative except for what is noted on the assessment and plan.  Vitals:   04/29/23 0920 04/29/23 0934  BP: (!) 156/67 (!) 153/60  Pulse: 64 61  Temp: 98.4 F (36.9 C)   TempSrc: Oral   SpO2: 100%   Weight: 160 lb 14.4 oz (73 kg)   Height: 5\' 6"  (1.676 m)     Physical Exam: Constitutional: well-appearing elderly female sitting in chair, in no acute distress Cardiovascular: regular rate and rhythm, systolic murmur Pulmonary/Chest: normal work of breathing on room air, lungs clear to auscultation bilaterally MSK: normal bulk and tone Neurological: alert & oriented x 3, no focal deficit Skin: warm and dry Psych: normal mood and behavior  Assessment & Plan:    Patient discussed with Dr. Lafonda Mosses  Essential hypertension Blood pressure is elevated to 153/60 (recheck). The patient states that she is taking olmesartan 40 mg daily, spironolactone 25 mg daily, and although her coreg is written as 12.5 mg BID, she is only taking it once daily. She denies any headaches, vision changes, chest pain, or SOB.  Plan: - Continue olmesartan 40 mg and spiro 25 mg daily - Counseled on taking coreg twice daily  - CMP today - Follow up in ~6-8 weeks to re-assess BP and if she needs further titration of her meds  Hyponatremia Sodium 124 on last labs. Rechecking  today.  Hypertriglyceridemia Triglycerides elevated in 500s over a year ago. Rechecking lipid profile today. She is not on any statin medication as it was thought to be the cause of her transaminitis previously.   Preventative health care FOBT screening due - kit provided. Flu shot given.    Elza Rafter, D.O. [facility], PGY-3 Phone: [phone] Date 04/29/2023 Time 10:28 AM

## 2023-04-29 NOTE — Assessment & Plan Note (Addendum)
Triglycerides elevated in 500s over a year ago. Rechecking lipid profile today. She is not on any statin medication as it was thought to be the cause of her transaminitis previously.

## 2023-04-29 NOTE — Assessment & Plan Note (Signed)
Blood pressure is elevated to 153/60 (recheck). The patient states that she is taking olmesartan 40 mg daily, spironolactone 25 mg daily, and although her coreg is written as 12.5 mg BID, she is only taking it once daily. She denies any headaches, vision changes, chest pain, or SOB.  Plan: - Continue olmesartan 40 mg and spiro 25 mg daily - Counseled on taking coreg twice daily  - CMP today - Follow up in ~6-8 weeks to re-assess BP and if she needs further titration of her meds

## 2023-04-29 NOTE — Progress Notes (Signed)
Subjective:   Jenna May is a 67 y.o. female who presents for an Initial Medicare Annual Wellness Visit.  Visit Complete: In person   Cardiac Risk Factors include: advanced age (>36men, >53 women);hypertension     Objective:    Today's Vitals   04/29/23 0950  BP: (!) 153/60  Pulse: 64  Temp: 98.6 F (37 C)  TempSrc: Oral  SpO2: 100%  Weight: 160 lb 14.4 oz (73 kg)  Height: 5\' 6"  (1.676 m)  PainSc: 0-No pain   Body mass index is 25.97 kg/m.     04/29/2023    9:52 AM 04/29/2023    9:32 AM 06/25/2022    3:00 PM 05/26/2022    2:54 PM 04/09/2022    2:02 PM 09/23/2021    9:35 AM 09/15/2021    2:59 PM  Advanced Directives  Does Patient Have a Medical Advance Directive? No No No No No No No  Would patient like information on creating a medical advance directive? No - Patient declined No - Patient declined No - Patient declined No - Patient declined No - Patient declined No - Patient declined No - Patient declined    Current Medications (verified) Outpatient Encounter Medications as of 04/29/2023  Medication Sig   carvedilol (COREG) 12.5 MG tablet Take 1 tablet by mouth twice daily   fish oil-omega-3 fatty acids 1000 MG capsule Take 2 capsules (2 g total) by mouth daily.   Menthol-Methyl Salicylate (MUSCLE RUB) 10-15 % CREA Apply 1 application topically as needed for muscle pain.   Multiple Vitamin (MULTIVITAMIN WITH MINERALS) TABS tablet Take 1 tablet by mouth daily.   olmesartan (BENICAR) 40 MG tablet Take 1 tablet by mouth once daily   spironolactone (ALDACTONE) 25 MG tablet Take 1 tablet (25 mg total) by mouth daily.   fluticasone (FLONASE) 50 MCG/ACT nasal spray Place 1 spray into both nostrils daily for 7 days.   No facility-administered encounter medications on file as of 04/29/2023.    Allergies (verified) Amlodipine   History: Past Medical History:  Diagnosis Date   ANEMIA, NORMOCYTIC 06/14/2006   Anxiety    Bartholin cyst 06/2003   Gingival  hyperplasia 04/26/2012   Gout    Hemorrhoids 12/03/2016   History of prolonged Q-T interval on ECG 01/06/2019   Hypertension    Ingrown hair 04/13/2019   Lipoma 03/2005   Adjacent to proximal right biceps tendon on MRI   LIPOMA 08/19/2006   Adjacent to proximal right biceps tendon MRI 09/06     Normocytic anemia    Unclear etiology. Anemia panel in 03/2010 showing Iron 53 , TIBC 303 , iron saturation percentage 17, ferritin 197 , B12 and folate within normal limits.   Paronychia of toe, right 11/03/2018   Postmenopausal status    Subjective vision disturbance, left eye 03/17/2018   Tobacco abuse    Quit 2009, after approximately 15 pack year smoking history.   Umbilical hernia    S/P repair approximately 1995   Urine abnormality 05/13/2017   Past Surgical History:  Procedure Laterality Date   APPENDECTOMY  approximately 1991   TOTAL ABDOMINAL HYSTERECTOMY  03/1996   2/2 fibroids   UMBILICAL HERNIA REPAIR  approximately 1995   Family History  Problem Relation Age of Onset   Hypertension Mother    Diabetes Father    Hypertension Father    Hypertension Sister    Heart attack Brother 78   Breast cancer Neg Hx    Social History   Socioeconomic History  Marital status: Single    Spouse name: Not on file   Number of children: 0   Years of education: 12th grade   Highest education level: High school graduate  Occupational History   Occupation: Copywriter, advertising: UNEMPLOYED  Tobacco Use   Smoking status: Former    Current packs/day: 0.00    Average packs/day: 0.4 packs/day for 30.0 years (12.0 ttl pk-yrs)    Types: Cigarettes    Start date: 04/11/1978    Quit date: 04/11/2008    Years since quitting: 15.0   Smokeless tobacco: Never  Vaping Use   Vaping status: Never Used  Substance and Sexual Activity   Alcohol use: Yes    Alcohol/week: 0.0 standard drinks of alcohol    Comment: occassional glass of wine   Drug use: No   Sexual activity: Not Currently    Birth  control/protection: Surgical  Other Topics Concern   Not on file  Social History Narrative   Unemployed. Intermittently worked, Engineer, mining, Copy.    Graduated from high school, [employer].   Single.   No children.   Orange card.   Social Determinants of Health   Financial Resource Strain: Low Risk  (04/29/2023)   Overall Financial Resource Strain (CARDIA)    Difficulty of Paying Living Expenses: Not hard at all  Food Insecurity: No Food Insecurity (04/29/2023)   Hunger Vital Sign    Worried About Running Out of Food in the Last Year: Never true    Ran Out of Food in the Last Year: Never true  Transportation Needs: No Transportation Needs (04/29/2023)   PRAPARE - Administrator, Civil Service (Medical): No    Lack of Transportation (Non-Medical): No  Physical Activity: Sufficiently Active (04/29/2023)   Exercise Vital Sign    Days of Exercise per Week: 7 days    Minutes of Exercise per Session: 30 min  Stress: No Stress Concern Present (04/29/2023)   [employer] of Occupational Health - Occupational Stress Questionnaire    Feeling of Stress : Not at all  Social Connections: Socially Isolated (04/29/2023)   Social Connection and Isolation Panel [NHANES]    Frequency of Communication with Friends and Family: Once a week    Frequency of Social Gatherings with Friends and Family: Once a week    Attends Religious Services: More than 4 times per year    Active Member of [employer] or Organizations: No    Attends Engineer, structural: Never    Marital Status: Never married    Tobacco Counseling Counseling given: Not Answered   Clinical Intake:  Pre-visit preparation completed: Yes  Pain : No/denies pain Pain Score: 0-No pain     BMI - recorded: 25 Nutritional Status: BMI 25 -29 Overweight Nutritional Risks: None Diabetes: No  How often do you need to have someone help you when you read instructions, pamphlets, or other written  materials from your doctor or pharmacy?: 1 - Never What is the last grade level you completed in school?: 12 GRADE  Interpreter Needed?: No  Information entered by :: [facility] Sian Joles   Activities of Daily Living    04/29/2023    9:52 AM 04/29/2023    9:29 AM  In your present state of health, do you have any difficulty performing the following activities:  Hearing? 0 0  Vision? 0 0  Difficulty concentrating or making decisions? 0 0  Walking or climbing stairs? 0 0  Dressing or bathing? 0  0  Doing errands, shopping? 0 0  Preparing Food and eating ? N   Using the Toilet? N   In the past six months, have you accidently leaked urine? N   Do you have problems with loss of bowel control? N   Managing your Medications? N   Managing your Finances? N   Housekeeping or managing your Housekeeping? N     Patient Care Team: Chauncey Mann, DO as PCP - General  Indicate any recent Medical Services you may have received from other than [facility] providers in the past year (date may be approximate).     Assessment:   This is a routine wellness examination for Aliah.  Hearing/Vision screen No results found.   Goals Addressed   None   Depression Screen    04/29/2023    9:28 AM 06/25/2022    2:58 PM 04/09/2022    2:50 PM 09/15/2021    3:01 PM 06/23/2021    5:04 PM 12/04/2020    4:23 PM 12/14/2019    2:18 PM  PHQ 2/9 Scores  PHQ - 2 Score 0 0 0 0 0 0 0  PHQ- 9 Score   1  0  0    Fall Risk    04/29/2023    9:52 AM 04/29/2023    9:28 AM 06/25/2022    2:59 PM 05/26/2022    2:53 PM 04/09/2022    2:02 PM  Fall Risk   Falls in the past year? 0 0 1 1 1   Number falls in past yr: 0 0 0 0 0  Injury with Fall? 0 0 0 0 0  Risk for fall due to : No Fall Risks No Fall Risks No Fall Risks History of fall(s);Impaired balance/gait History of fall(s)  Follow up Falls prevention discussed;Falls evaluation completed Falls prevention discussed;Falls evaluation completed Falls evaluation  completed;Falls prevention discussed Falls evaluation completed;Falls prevention discussed Falls evaluation completed    MEDICARE RISK AT HOME: Medicare Risk at Home Any stairs in or around the home?: No If so, are there any without handrails?: No Home free of loose throw rugs in walkways, pet beds, electrical cords, etc?: Yes Adequate lighting in your home to reduce risk of falls?: Yes Life alert?: No Use of a cane, walker or w/c?: No Grab bars in the bathroom?: Yes Shower chair or bench in shower?: No Elevated toilet seat or a handicapped toilet?: No  TIMED UP AND GO:  Was the test performed? No    Cognitive Function:        04/29/2023    9:53 AM  6CIT Screen  What Year? 0 points  What month? 0 points  What time? 0 points  Count back from 20 0 points  Months in reverse 0 points  Repeat phrase 0 points  Total Score 0 points    Immunizations Immunization History  Administered Date(s) Administered   Influenza Split 06/04/2011, 04/21/2012   Influenza, Seasonal, Injecte, Preservative Fre 04/29/2023   Influenza,inj,Quad PF,6+ Mos 03/28/2013, 04/17/2014, 04/30/2015, 06/04/2016, 05/13/2017, 03/17/2018, 04/24/2019, 06/23/2021, 05/26/2022   Moderna Sars-Covid-2 Vaccination 01/06/2020, 02/03/2020   PFIZER(Purple Top)SARS-COV-2 Vaccination 10/20/2020   PNEUMOCOCCAL CONJUGATE-20 09/15/2021   Tdap 11/06/2010, 11/25/2020    TDAP status: Up to date  Flu Vaccine status: Completed at today's visit    Covid-19 vaccine status: Completed vaccines  Qualifies for Shingles Vaccine? Yes   Zostavax completed No   Shingrix Completed?: No.    Education has been provided regarding the importance of this vaccine. Patient has  been advised to call insurance company to determine out of pocket expense if they have not yet received this vaccine. Advised may also receive vaccine at local pharmacy or Health Dept. Verbalized acceptance and understanding.  Screening Tests Health Maintenance   Topic Date Due   Zoster Vaccines- Shingrix (1 of 2) Never done   Colonoscopy  05/29/2019   COVID-19 Vaccine (4 - 2023-24 season) 03/28/2023   COLON CANCER SCREENING ANNUAL FOBT  04/17/2023   Medicare Annual Wellness (AWV)  04/28/2024   MAMMOGRAM  06/25/2024   DTaP/Tdap/Td (3 - Td or Tdap) 11/26/2030   INFLUENZA VACCINE  Completed   Hepatitis C Screening  Completed   HIV Screening  Completed   HPV VACCINES  Aged Out    Health Maintenance  Health Maintenance Due  Topic Date Due   Zoster Vaccines- Shingrix (1 of 2) Never done   Colonoscopy  05/29/2019   COVID-19 Vaccine (4 - 2023-24 season) 03/28/2023   COLON CANCER SCREENING ANNUAL FOBT  04/17/2023    Colorectal cancer screening: Type of screening: FOBT/FIT. Completed 59/21/2023. Repeat every 1 years  Mammogram status: Completed 06/25/2022. Repeat every year    Lung Cancer Screening: (Low Dose CT Chest recommended if Age 96-80 years, 20 pack-year currently smoking OR have quit w/in 15years.) does not qualify.   Lung Cancer Screening Referral: DEFERRED TO PCP   Additional Screening:  Hepatitis C Screening: does qualify; Completed 12/04/2020  Vision Screening: Recommended annual ophthalmology exams for early detection of glaucoma and other disorders of the eye. Is the patient up to date with their annual eye exam?  No  Who is the provider or what is the name of the office in which the patient attends annual eye exams?  DOES NOT HAVE ONE  If pt is not established with a provider, would they like to be referred to a provider to establish care? Yes .   Dental Screening: Recommended annual dental exams for proper oral hygiene  Diabetic Foot Exam: N/A  Community Resource Referral / Chronic Care Management: CRR required this visit?  No   CCM required this visit?  No     Plan:     I have personally reviewed and noted the following in the patient's chart:   Medical and social history Use of alcohol, tobacco or illicit  drugs  Current medications and supplements including opioid prescriptions. Patient is not currently taking opioid prescriptions. Functional ability and status Nutritional status Physical activity Advanced directives List of other physicians Hospitalizations, surgeries, and ER visits in previous 12 months Vitals Screenings to include cognitive, depression, and falls Referrals and appointments  In addition, I have reviewed and discussed with patient certain preventive protocols, quality metrics, and best practice recommendations. A written personalized care plan for preventive services as well as general preventive health recommendations were provided to patient.     Derrell Lolling, CMA   04/29/2023   After Visit Summary: (In Person-Printed) AVS printed and given to the patient  Nurse Notes: IN PERSON [facility]   Ms. Davern , Thank you for taking time to come for your Medicare Wellness Visit. I appreciate your ongoing commitment to your health goals. Please review the following plan we discussed and let me know if I can assist you in the future.   These are the goals we discussed:  Goals      Blood Pressure < 140/90        This is a list of the screening recommended for you and due dates:  Health Maintenance  Topic Date Due   Zoster (Shingles) Vaccine (1 of 2) Never done   Colon Cancer Screening  05/29/2019   COVID-19 Vaccine (4 - 2023-24 season) 03/28/2023   Stool Blood Test  04/17/2023   Medicare Annual Wellness Visit  04/28/2024   Mammogram  06/25/2024   DTaP/Tdap/Td vaccine (3 - Td or Tdap) 11/26/2030   Flu Shot  Completed   Hepatitis C Screening  Completed   HIV Screening  Completed   HPV Vaccine  Aged Out

## 2023-04-29 NOTE — Assessment & Plan Note (Signed)
FOBT screening due - kit provided. Flu shot given.

## 2023-04-29 NOTE — Assessment & Plan Note (Signed)
Sodium 124 on last labs. Rechecking today.

## 2023-04-30 LAB — CMP14 + ANION GAP
ALT: 70 [IU]/L — ABNORMAL HIGH (ref 0–32)
AST: 67 [IU]/L — ABNORMAL HIGH (ref 0–40)
Albumin: 4 g/dL (ref 3.9–4.9)
Alkaline Phosphatase: 146 [IU]/L — ABNORMAL HIGH (ref 44–121)
Anion Gap: 15 mmol/L (ref 10.0–18.0)
BUN/Creatinine Ratio: 24 (ref 12–28)
BUN: 25 mg/dL (ref 8–27)
Bilirubin Total: 0.6 mg/dL (ref 0.0–1.2)
CO2: 17 mmol/L — ABNORMAL LOW (ref 20–29)
Calcium: 9.6 mg/dL (ref 8.7–10.3)
Chloride: 94 mmol/L — ABNORMAL LOW (ref 96–106)
Creatinine, Ser: 1.06 mg/dL — ABNORMAL HIGH (ref 0.57–1.00)
Globulin, Total: 4.4 g/dL (ref 1.5–4.5)
Glucose: 94 mg/dL (ref 70–99)
Potassium: 4.5 mmol/L (ref 3.5–5.2)
Sodium: 126 mmol/L — ABNORMAL LOW (ref 134–144)
Total Protein: 8.4 g/dL (ref 6.0–8.5)
eGFR: 59 mL/min/{1.73_m2} — ABNORMAL LOW (ref >59)

## 2023-04-30 LAB — LIPID PANEL
Chol/HDL Ratio: 3.5 {ratio} (ref 0.0–4.4)
Cholesterol, Total: 99 mg/dL — ABNORMAL LOW (ref 100–199)
HDL: 28 mg/dL — ABNORMAL LOW (ref >39)
LDL Chol Calc (NIH): 36 mg/dL (ref 0–99)
Triglycerides: 223 mg/dL — ABNORMAL HIGH (ref 0–149)
VLDL Cholesterol Cal: 35 mg/dL (ref 5–40)

## 2023-04-30 NOTE — Progress Notes (Signed)
Trigs improved to 223 from 509 (and 800+ previously). She is not on a statin as it was previously thought to be the cause of her transaminitis.   Transaminitis is improved compared to labs from a year ago, but still abnormal. Sodium is also still low at 126, but improved. Unclear etiology of this patient's hyponatremia at this time- will need to continue to monitor but thankfully she is asymptomatic.

## 2023-04-30 NOTE — Progress Notes (Signed)
Internal Medicine Clinic Attending  Case discussed with the resident at the time of the visit.  We reviewed the resident's history and exam and pertinent patient test results.  I agree with the assessment, diagnosis, and plan of care documented in the resident's note.  

## 2023-04-30 NOTE — Progress Notes (Signed)
Internal Medicine Clinic Attending  Case and documentation of Dr. Ned Card  soon after the resident saw the patient reviewed.  I reviewed the AWV findings.  I agree with the assessment, diagnosis, and plan of care documented in the AWV note.

## 2023-05-03 NOTE — Progress Notes (Signed)
Internal Medicine Clinic Attending  Case discussed with the resident at the time of the visit.  We reviewed the resident's history and exam and pertinent patient test results.  I agree with the assessment, diagnosis, and plan of care documented in the resident's note.    I'd recommend checking Serum osms and BMP at next visit for further evaluation of her hyponatremia.

## 2023-05-05 ENCOUNTER — Other Ambulatory Visit: Payer: Medicare Other

## 2023-05-05 DIAGNOSIS — Z1211 Encounter for screening for malignant neoplasm of colon: Secondary | ICD-10-CM | POA: Diagnosis not present

## 2023-05-06 LAB — FECAL OCCULT BLOOD, IMMUNOCHEMICAL: Fecal Occult Bld: NEGATIVE

## 2023-05-07 NOTE — Progress Notes (Signed)
Negative FOBT again. Next one due October 2025

## 2023-05-17 ENCOUNTER — Other Ambulatory Visit (HOSPITAL_COMMUNITY): Payer: Self-pay

## 2023-05-18 ENCOUNTER — Other Ambulatory Visit (HOSPITAL_COMMUNITY): Payer: Self-pay

## 2023-06-14 ENCOUNTER — Ambulatory Visit: Payer: Medicare Other | Admitting: Internal Medicine

## 2023-06-14 VITALS — BP 162/71 | HR 57 | Temp 98.2°F | Ht 66.0 in | Wt 159.5 lb

## 2023-06-14 DIAGNOSIS — I10 Essential (primary) hypertension: Secondary | ICD-10-CM

## 2023-06-14 DIAGNOSIS — Z1231 Encounter for screening mammogram for malignant neoplasm of breast: Secondary | ICD-10-CM

## 2023-06-14 DIAGNOSIS — E038 Other specified hypothyroidism: Secondary | ICD-10-CM | POA: Diagnosis not present

## 2023-06-14 DIAGNOSIS — E871 Hypo-osmolality and hyponatremia: Secondary | ICD-10-CM

## 2023-06-14 DIAGNOSIS — K7460 Unspecified cirrhosis of liver: Secondary | ICD-10-CM

## 2023-06-14 DIAGNOSIS — Z13820 Encounter for screening for osteoporosis: Secondary | ICD-10-CM

## 2023-06-14 DIAGNOSIS — E872 Acidosis, unspecified: Secondary | ICD-10-CM | POA: Insufficient documentation

## 2023-06-14 DIAGNOSIS — Z0000 Encounter for general adult medical examination without abnormal findings: Secondary | ICD-10-CM

## 2023-06-14 MED ORDER — SPIRONOLACTONE 50 MG PO TABS: 50.0000 mg | ORAL_TABLET | Freq: Every day | ORAL | 11 refills | Status: DC

## 2023-06-14 NOTE — Assessment & Plan Note (Addendum)
Blood pressure remains elevated to 162/71 despite adherence with her olmesartan 40 mg daily, spironolactone 25 mg daily, and coreg 12.5 mg BID. The patient brought in all of her medications today and we confirmed that she is taking them correctly. She denies any headaches, vision changes, chest pain, or SOB.  Of note, the patient states that her brother is on an antihypertensive medication that has brought his BP back down to normal - she is going to call me to discuss what this medication is, as she is interested in seeing if she can take it too. She did call me later this morning and states that her brother is on losartan-hydrochlorothiazide. We discussed that she cannot be on this medicine since she previously took hydrochlorothiazide and had gout flares - she understands.   Plan: - Continue olmesartan 40 mg daily - Continue coreg 12.5 mg BID - Increase spironolactone to 50 mg daily - Follow up in 4 weeks

## 2023-06-14 NOTE — Progress Notes (Signed)
CC: Hypertension  HPI:  [name] is a 67 y.o. female living with a history stated below and presents today for 80-month follow-up of HTN. Please see problem based assessment and plan for additional details.  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 Anemia panel in 03/2010 showing Iron 53 , TIBC 303 , iron saturation percentage 17, ferritin 197 , B12 and folate within normal limits.   Paronychia of toe, right 11/03/2018   Postmenopausal status    Subjective vision disturbance, left eye 03/17/2018   Tobacco abuse    Quit 2009, after approximately 15 pack year smoking history.   Umbilical hernia    S/P repair approximately 1995   Urine abnormality 05/13/2017    Current Outpatient Medications on File Prior to Visit  Medication Sig Dispense Refill   carvedilol (COREG) 12.5 MG tablet Take 1 tablet (12.5 mg total) by mouth 2 (two) times daily. 60 tablet 11   fish oil-omega-3 fatty acids 1000 MG capsule Take 2 capsules (2 g total) by mouth daily. 60 capsule 5   fluticasone (FLONASE) 50 MCG/ACT nasal spray Place 1 spray into both nostrils daily for 7 days. 1 g 0   Menthol-Methyl Salicylate (MUSCLE RUB) 10-15 % CREA Apply 1 application topically as needed for muscle pain.     Multiple Vitamin (MULTIVITAMIN WITH MINERALS) TABS tablet Take 1 tablet by mouth daily.     olmesartan (BENICAR) 40 MG tablet Take 1 tablet (40 mg total) by mouth daily. 30 tablet 3   No current facility-administered medications on file prior to visit.    Family History  Problem Relation Age of Onset    Hypertension Mother    Diabetes Father    Hypertension Father    Hypertension Sister    Heart attack Brother 38   Breast cancer Neg Hx     Social History   Socioeconomic History   Marital status: Single    Spouse name: Not on file   Number of children: 0   Years of education: 12th grade   Highest education level: High school graduate  Occupational History   Occupation: Copywriter, advertising: UNEMPLOYED  Tobacco Use   Smoking status: Former    Current packs/day: 0.00    Average packs/day: 0.4 packs/day for 30.0 years (12.0 ttl pk-yrs)    Types: Cigarettes    Start date: 04/11/1978    Quit date: 04/11/2008    Years since quitting: 15.1   Smokeless tobacco: Never  Vaping Use   Vaping status: Never Used  Substance and Sexual Activity   Alcohol use: Yes    Alcohol/week: 0.0 standard drinks of alcohol    Comment: occassional glass of wine   Drug use: No   Sexual activity: Not Currently    Birth control/protection: Surgical  Other Topics Concern   Not on file  Social History Narrative   Unemployed. Intermittently worked, Engineer, mining, Copy.    Graduated from high school, [employer].   Single.   No children.  Orange card.   Social Determinants of Health   Financial Resource Strain: Low Risk  (04/29/2023)   Overall Financial Resource Strain (CARDIA)    Difficulty of Paying Living Expenses: Not hard at all  Food Insecurity: No Food Insecurity (04/29/2023)   Hunger Vital Sign    Worried About Running Out of Food in the Last Year: Never true    Ran Out of Food in the Last Year: Never true  Transportation Needs: No Transportation Needs (04/29/2023)   PRAPARE - Administrator, Civil Service (Medical): No    Lack of Transportation (Non-Medical): No  Physical Activity: Sufficiently Active (04/29/2023)   Exercise Vital Sign    Days of Exercise per Week: 7 days    Minutes of Exercise per Session: 30 min  Stress: No Stress Concern Present  (04/29/2023)   [employer] of Occupational Health - Occupational Stress Questionnaire    Feeling of Stress : Not at all  Social Connections: Socially Isolated (04/29/2023)   Social Connection and Isolation Panel [NHANES]    Frequency of Communication with Friends and Family: Once a week    Frequency of Social Gatherings with Friends and Family: Once a week    Attends Religious Services: More than 4 times per year    Active Member of [employer] or Organizations: No    Attends Banker Meetings: Never    Marital Status: Never married  Intimate Partner Violence: Not At Risk (04/29/2023)   Humiliation, Afraid, Rape, and Kick questionnaire    Fear of Current or Ex-Partner: No    Emotionally Abused: No    Physically Abused: No    Sexually Abused: No    Review of Systems: ROS negative except for what is noted on the assessment and plan.  Vitals:   06/14/23 0926 06/14/23 0953  BP: (!) 171/71 (!) 162/71  Pulse: 62 (!) 57  Temp: 98.2 F (36.8 C)   TempSrc: Oral   SpO2: 100%   Weight: 159 lb 8 oz (72.3 kg)   Height: 5\' 6"  (1.676 m)     Physical Exam: Constitutional: well-appearing, anxious elderly female sitting in chair, in no acute distress Cardiovascular: regular rate and rhythm Pulmonary/Chest: normal work of breathing on room air MSK: normal bulk and tone Neurological: alert & oriented x 3, no focal deficit Skin: warm and dry  Assessment & Plan:    Patient discussed with Dr. Cleda Daub  Cirrhosis [facility]) To our knowledge, patient does not have a confirmed diagnosis of cirrhosis, but has not had an ultrasound to assess. CMP has been abnormal in the past with elevated transaminases, but these were improved at the last office visit. Will obtain RUQ ultrasound.  Essential hypertension Blood pressure remains elevated to 162/71 despite adherence with her olmesartan 40 mg daily, spironolactone 25 mg daily, and coreg 12.5 mg BID. The patient brought in all of her  medications today and we confirmed that she is taking them correctly. She denies any headaches, vision changes, chest pain, or SOB.  Of note, the patient states that her brother is on an antihypertensive medication that has brought his BP back down to normal - she is going to call me to discuss what this medication is, as she is interested in seeing if she can take it too. She did call me later this morning and states that her brother is on losartan-hydrochlorothiazide. We discussed that she cannot be on this medicine since she previously took hydrochlorothiazide and had gout flares - she understands.  Plan: - Continue olmesartan 40 mg daily - Continue coreg 12.5 mg BID - Increase spironolactone to 50 mg daily - Follow up in 4 weeks  Preventative health care DEXA scan ordered  Subclinical hypothyroidism Patient with history of subclinical hypothyroidism. She remains asymptomatic, but we will recheck TSH since it has been a few years.  Hyponatremia Sodium remains low but was improved from 124 to 126 at the last office visit. The etiology of her hyponatremia is unclear at this time.  Plan: - BMP, serum osm, and TSH   Metabolic acidosis Bicarb level has been consistently low, around 17, for years. Unclear etiology of her chronic metabolic acidosis, but we will recheck this with a BMP today and TSH.    Elza Rafter, D.O. [facility] Internal Medicine, PGY-3 Phone: [phone] Date 06/14/2023 Time 11:38 AM

## 2023-06-14 NOTE — Patient Instructions (Signed)
Thank you, Ms.Kanchan Wiltsey for allowing [place] to provide your care today. Today we discussed:  High blood pressure Keep taking carvedilol 12.5 mg TWICE a day Keep taking olmesartan 40 mg once a day Increase your spironolactone to 50 mg daily (two tablets) Low sodium level We are rechecking your blood work today DEXA scan to check your bone health ordered  I have ordered the following labs for you:   Lab Orders         BMP8+Anion Gap         Osmolality, Serum         TSH      Referrals ordered today:   Referral Orders  No referral(s) requested today     I have ordered the following medication/changed the following medications:   Stop the following medications: There are no discontinued medications.   Start the following medications: No orders of the defined types were placed in this encounter.    Follow up:  ~ 4 weeks     Should you have any questions or concerns please call the internal medicine clinic at [phone].     Elza Rafter, D.O. [facility]

## 2023-06-14 NOTE — Assessment & Plan Note (Signed)
Sodium remains low but was improved from 124 to 126 at the last office visit. The etiology of her hyponatremia is unclear at this time.  Plan: - BMP, serum osm, and TSH

## 2023-06-14 NOTE — Assessment & Plan Note (Signed)
>>  ASSESSMENT AND PLAN FOR ELEVATED LIVER ENZYMES WRITTEN ON 06/14/2023 10:08 AM BY ATWAY, RAYANN N, DO  To our knowledge, patient does not have a confirmed diagnosis of cirrhosis, but has not had an ultrasound to assess. CMP has been abnormal in the past with elevated transaminases, but these were improved at the last office visit. Will obtain RUQ ultrasound.

## 2023-06-14 NOTE — Assessment & Plan Note (Signed)
-   DEXA scan ordered

## 2023-06-14 NOTE — Assessment & Plan Note (Signed)
To our knowledge, patient does not have a confirmed diagnosis of cirrhosis, but has not had an ultrasound to assess. CMP has been abnormal in the past with elevated transaminases, but these were improved at the last office visit. Will obtain RUQ ultrasound.

## 2023-06-14 NOTE — Assessment & Plan Note (Signed)
Patient with history of subclinical hypothyroidism. She remains asymptomatic, but we will recheck TSH since it has been a few years.

## 2023-06-14 NOTE — Progress Notes (Signed)
Internal Medicine Clinic Attending  Case discussed with the resident at the time of the visit.  We reviewed the resident's history and exam and pertinent patient test results.  I agree with the assessment, diagnosis, and plan of care documented in the resident's note.  

## 2023-06-14 NOTE — Assessment & Plan Note (Signed)
Bicarb level has been consistently low, around 17, for years. Unclear etiology of her chronic metabolic acidosis, but we will recheck this with a BMP today and TSH.

## 2023-06-15 NOTE — Addendum Note (Signed)
Addended by: Elza Rafter on: 06/15/2023 03:22 PM   Modules accepted: Orders

## 2023-06-17 LAB — T4, FREE: Free T4: 1.09 ng/dL (ref 0.82–1.77)

## 2023-06-17 LAB — SPECIMEN STATUS REPORT

## 2023-06-17 NOTE — Progress Notes (Signed)
Called patient about results. Na improved to 133, GFR slightly improved to 60 (CKD II), bicarb low but stable at 17. TSH was elevated, but free T4 normal and the patient is without any overt s/s of hypothyroidism.

## 2023-06-17 NOTE — Addendum Note (Signed)
Addended by: Elza Rafter on: 06/17/2023 01:15 PM   Modules accepted: Orders

## 2023-06-18 LAB — BMP8+ANION GAP
Anion Gap: 18 mmol/L (ref 10.0–18.0)
BUN/Creatinine Ratio: 18 (ref 12–28)
BUN: 19 mg/dL (ref 8–27)
CO2: 17 mmol/L — ABNORMAL LOW (ref 20–29)
Calcium: 9.4 mg/dL (ref 8.7–10.3)
Chloride: 98 mmol/L (ref 96–106)
Creatinine, Ser: 1.03 mg/dL — ABNORMAL HIGH (ref 0.57–1.00)
Glucose: 83 mg/dL (ref 70–99)
Potassium: 4.6 mmol/L (ref 3.5–5.2)
Sodium: 133 mmol/L — ABNORMAL LOW (ref 134–144)
eGFR: 60 mL/min/{1.73_m2} (ref >59)

## 2023-06-18 LAB — OSMOLALITY: Osmolality Meas: 276 mosm/kg — ABNORMAL LOW (ref 280–301)

## 2023-06-18 LAB — TSH: TSH: 7.61 u[IU]/mL — ABNORMAL HIGH (ref 0.450–4.500)

## 2023-07-08 ENCOUNTER — Ambulatory Visit: Payer: No Typology Code available for payment source

## 2023-07-09 ENCOUNTER — Ambulatory Visit (INDEPENDENT_AMBULATORY_CARE_PROVIDER_SITE_OTHER): Payer: Medicare Other | Admitting: Internal Medicine

## 2023-07-09 VITALS — BP 161/68 | HR 64 | Ht 66.0 in | Wt 161.0 lb

## 2023-07-09 DIAGNOSIS — E872 Acidosis, unspecified: Secondary | ICD-10-CM | POA: Diagnosis not present

## 2023-07-09 DIAGNOSIS — Z0000 Encounter for general adult medical examination without abnormal findings: Secondary | ICD-10-CM | POA: Diagnosis not present

## 2023-07-09 DIAGNOSIS — I10 Essential (primary) hypertension: Secondary | ICD-10-CM

## 2023-07-09 LAB — BASIC METABOLIC PANEL
Anion gap: 11 (ref 5–15)
BUN: 25 mg/dL — ABNORMAL HIGH (ref 8–23)
CO2: 19 mmol/L — ABNORMAL LOW (ref 22–32)
Calcium: 9.3 mg/dL (ref 8.9–10.3)
Chloride: 101 mmol/L (ref 98–111)
Creatinine, Ser: 1.08 mg/dL — ABNORMAL HIGH (ref 0.44–1.00)
GFR, Estimated: 57 mL/min — ABNORMAL LOW (ref >60)
Glucose, Bld: 95 mg/dL (ref 70–99)
Potassium: 4.3 mmol/L (ref 3.5–5.1)
Sodium: 131 mmol/L — ABNORMAL LOW (ref 135–145)

## 2023-07-09 MED ORDER — SPIRONOLACTONE 100 MG PO TABS: 100.0000 mg | ORAL_TABLET | Freq: Every day | ORAL | 11 refills | Status: DC

## 2023-07-09 NOTE — Assessment & Plan Note (Signed)
Mammogram next Friday, 12/20. Hoping to get DEXA at same time.

## 2023-07-09 NOTE — Progress Notes (Signed)
CC: HTN  HPI:  [name] is a 67 y.o. female living with a history stated below and presents today for a follow up of her blood pressure. Please see problem based assessment and plan for additional details.  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 Anemia panel in 03/2010 showing Iron 53 , TIBC 303 , iron saturation percentage 17, ferritin 197 , B12 and folate within normal limits.   Paronychia of toe, right 11/03/2018   Postmenopausal status    Subjective vision disturbance, left eye 03/17/2018   Tobacco abuse    Quit 2009, after approximately 15 pack year smoking history.   Umbilical hernia    S/P repair approximately 1995   Urine abnormality 05/13/2017    Current Outpatient Medications on File Prior to Visit  Medication Sig Dispense Refill   carvedilol (COREG) 12.5 MG tablet Take 1 tablet (12.5 mg total) by mouth 2 (two) times daily. 60 tablet 11   fish oil-omega-3 fatty acids 1000 MG capsule Take 2 capsules (2 g total) by mouth daily. 60 capsule 5   fluticasone (FLONASE) 50 MCG/ACT nasal spray Place 1 spray into both nostrils daily for 7 days. 1 g 0   Menthol-Methyl Salicylate (MUSCLE RUB) 10-15 % CREA Apply 1 application topically as needed for muscle pain.     Multiple Vitamin (MULTIVITAMIN WITH MINERALS) TABS tablet Take 1 tablet by mouth daily.     olmesartan (BENICAR) 40 MG tablet Take 1 tablet (40 mg total) by mouth daily. 30 tablet 3   No current facility-administered medications on file prior to visit.    Family History  Problem Relation Age of Onset    Hypertension Mother    Diabetes Father    Hypertension Father    Hypertension Sister    Heart attack Brother 76   Breast cancer Neg Hx     Social History   Socioeconomic History   Marital status: Single    Spouse name: Not on file   Number of children: 0   Years of education: 12th grade   Highest education level: High school graduate  Occupational History   Occupation: Copywriter, advertising: UNEMPLOYED  Tobacco Use   Smoking status: Former    Current packs/day: 0.00    Average packs/day: 0.4 packs/day for 30.0 years (12.0 ttl pk-yrs)    Types: Cigarettes    Start date: 04/11/1978    Quit date: 04/11/2008    Years since quitting: 15.2   Smokeless tobacco: Never  Vaping Use   Vaping status: Never Used  Substance and Sexual Activity   Alcohol use: Yes    Alcohol/week: 0.0 standard drinks of alcohol    Comment: occassional glass of wine   Drug use: No   Sexual activity: Not Currently    Birth control/protection: Surgical  Other Topics Concern   Not on file  Social History Narrative   Unemployed. Intermittently worked, Engineer, mining, Copy.    Graduated from high school, [employer].   Single.  No children.   Orange card.   Social Drivers of Corporate investment banker Strain: Low Risk  (04/29/2023)   Overall Financial Resource Strain (CARDIA)    Difficulty of Paying Living Expenses: Not hard at all  Food Insecurity: No Food Insecurity (04/29/2023)   Hunger Vital Sign    Worried About Running Out of Food in the Last Year: Never true    Ran Out of Food in the Last Year: Never true  Transportation Needs: No Transportation Needs (04/29/2023)   PRAPARE - Administrator, Civil Service (Medical): No    Lack of Transportation (Non-Medical): No  Physical Activity: Sufficiently Active (04/29/2023)   Exercise Vital Sign    Days of Exercise per Week: 7 days    Minutes of Exercise per Session: 30 min  Stress: No Stress Concern Present  (04/29/2023)   [employer] of Occupational Health - Occupational Stress Questionnaire    Feeling of Stress : Not at all  Social Connections: Socially Isolated (04/29/2023)   Social Connection and Isolation Panel [NHANES]    Frequency of Communication with Friends and Family: Once a week    Frequency of Social Gatherings with Friends and Family: Once a week    Attends Religious Services: More than 4 times per year    Active Member of [employer] or Organizations: No    Attends Banker Meetings: Never    Marital Status: Never married  Intimate Partner Violence: Not At Risk (04/29/2023)   Humiliation, Afraid, Rape, and Kick questionnaire    Fear of Current or Ex-Partner: No    Emotionally Abused: No    Physically Abused: No    Sexually Abused: No    Review of Systems: ROS negative except for what is noted on the assessment and plan.  Vitals:   07/09/23 0934 07/09/23 0954  BP: (!) 169/70 (!) 161/68  Pulse: 69 64  SpO2: 100%   Weight: 161 lb (73 kg)   Height: 5\' 6"  (1.676 m)     Physical Exam: Constitutional: well-appearing, anxious elderly female sitting in chair, in no acute distress Cardiovascular: regular rate and rhythm Pulmonary/Chest: normal work of breathing on room air, lungs clear to auscultation bilaterally MSK: normal bulk and tone Skin: warm and dry  Assessment & Plan:    Patient discussed with Dr. Sol Blazing  Essential hypertension Blood pressure today remains elevated to 161/68 despite coreg 12.5 mg bid, olmesartan 40 mg daily, and increasing her spironolactone to 50 mg daily at the last office visit. She denies any headaches, dizziness, vision changes, chest pain, or SOB.  The patient does get very anxious coming to the doctor, and thinks this is in part why her BP is elevated everytime she comes here. She has not checked her BP regularly at home though, but denies symptoms of hypo/hypertension.  Plan: - Continue olmesartan 40 mg daily - Continue coreg  12.5 mg bid (hesitant to increase this as pulse is already in the 60s) - Increase spironolactone to 100 mg daily - Unable to be on hydrochlorothiazide due to gout - Unable to be on amlodipine due to gingival hyperplasia - BMP today - Provided patient with BP log to check BP at home   Preventative health care Mammogram next Friday, 12/20. Hoping to get DEXA at same time.   Metabolic acidosis Unclear etiology of this patient's chronic metabolic acidosis. She does drink alcohol a few times per week, but denies binge drinking and she does not display habits of alcohol use  disorder. She also does not take tylenol regularly. She has no hx of jejunoileal bypass/short bowel syndrome, to suggest a D-lactic acidosis as the etiology of this.   Plan: - BMP again today   Seyon Strader, D.O. [facility], PGY-3 Phone: [phone] Date 07/09/2023 Time 10:27 AM

## 2023-07-09 NOTE — Patient Instructions (Addendum)
Thank you, Jenna May for allowing [place] to provide your care today. Today we discussed:  High blood pressure Keep taking olmesartan 40 mg daily Keep taking carvedilol 12.5 mg bid Please INCREASE your spironolactone to 100 mg daily  Mammogram is next Friday, 12/20  I have ordered the following labs for you:  Lab Orders  No laboratory test(s) ordered today     Referrals ordered today:   Referral Orders  No referral(s) requested today     I have ordered the following medication/changed the following medications:   Stop the following medications: There are no discontinued medications.   Start the following medications: No orders of the defined types were placed in this encounter.    Follow up: 2-3 months   Should you have any questions or concerns please call the internal medicine clinic at [phone].     Elza Rafter, D.O. [facility]

## 2023-07-09 NOTE — Assessment & Plan Note (Signed)
Unclear etiology of this patient's chronic metabolic acidosis. She does drink alcohol a few times per week, but denies binge drinking and she does not display habits of alcohol use disorder. She also does not take tylenol regularly. She has no hx of jejunoileal bypass/short bowel syndrome, to suggest a D-lactic acidosis as the etiology of this.   Plan: - BMP again today

## 2023-07-09 NOTE — Progress Notes (Signed)
Na slightly lower at 131 (she has had hyponatremia dating back to 05/2022 where Na was as low as 121). Serum osm checked last visit and were 276. Query isotonic vs hypotonic (hypotonic more likely) etiologies of hyponatremia. Less suspicious for isotonic causes like elevated trigs (last TG 223) or MM (calcium normal, Cr only 1.08). Could get urine osm next visit to work this up further.   K is within normal limits, okay to start higher dose of spiro. Kidney function also stable with Cr of 1.08  Bicarb is still low, but improved to 19 without an anion gap (suspect elevated anion gap was 2/2 lab error).

## 2023-07-09 NOTE — Assessment & Plan Note (Addendum)
Blood pressure today remains elevated to 161/68 despite coreg 12.5 mg bid, olmesartan 40 mg daily, and increasing her spironolactone to 50 mg daily at the last office visit. She denies any headaches, dizziness, vision changes, chest pain, or SOB.  The patient does get very anxious coming to the doctor, and thinks this is in part why her BP is elevated everytime she comes here. She has not checked her BP regularly at home though, but denies symptoms of hypo/hypertension.  Plan: - Continue olmesartan 40 mg daily - Continue coreg 12.5 mg bid (hesitant to increase this as pulse is already in the 60s) - Increase spironolactone to 100 mg daily - Unable to be on hydrochlorothiazide due to gout - Unable to be on amlodipine due to gingival hyperplasia - BMP today - Provided patient with BP log to check BP at home

## 2023-07-12 NOTE — Progress Notes (Signed)
Internal Medicine Clinic Attending ° °Case discussed with Dr. Atway  At the time of the visit.  We reviewed the resident’s history and exam and pertinent patient test results.  I agree with the assessment, diagnosis, and plan of care documented in the resident’s note.  °

## 2023-07-13 ENCOUNTER — Telehealth: Payer: Self-pay | Admitting: Internal Medicine

## 2023-07-13 DIAGNOSIS — Z13820 Encounter for screening for osteoporosis: Secondary | ICD-10-CM

## 2023-07-13 DIAGNOSIS — Z1239 Encounter for other screening for malignant neoplasm of breast: Secondary | ICD-10-CM

## 2023-07-13 DIAGNOSIS — Z1231 Encounter for screening mammogram for malignant neoplasm of breast: Secondary | ICD-10-CM

## 2023-07-13 NOTE — Telephone Encounter (Signed)
Rec'd a call from the patient who is unable to use the [facility] for her Mammogram and Dexa Scan due to her Ins Plan being out of network.  Can a new Order be placed for both to be sent to [facility] ([facility])?  She is currently sch for their office on this Friday 07/16/2023 to be seen.

## 2023-07-16 DIAGNOSIS — Z1231 Encounter for screening mammogram for malignant neoplasm of breast: Secondary | ICD-10-CM | POA: Diagnosis not present

## 2023-07-16 LAB — HM MAMMOGRAPHY

## 2023-08-05 ENCOUNTER — Encounter: Payer: Self-pay | Admitting: Internal Medicine

## 2023-08-06 NOTE — Progress Notes (Signed)
Called patient about normal mammogram results.

## 2023-09-02 ENCOUNTER — Other Ambulatory Visit: Payer: Self-pay | Admitting: Internal Medicine

## 2023-09-02 DIAGNOSIS — I10 Essential (primary) hypertension: Secondary | ICD-10-CM

## 2023-09-02 NOTE — Telephone Encounter (Signed)
 Medication sent to pharmacy

## 2023-10-13 ENCOUNTER — Telehealth: Admitting: *Deleted

## 2023-10-13 NOTE — Telephone Encounter (Signed)
 Spoke with [facility] Referral Coordinator for the Clinics.  Is currently working on getting patient's Imaging prior Authorized.  Patient will be call and informed of time.  Patient was also informed that she will have the option of calling to reschedule the appointments if she is unable to go.  Does not need to speak with the doctor as of yet.

## 2023-10-13 NOTE — Telephone Encounter (Signed)
 Copied from CRM [id number]. Topic: Clinical - Medical Advice >> Oct 13, 2023  9:17 AM Jenna May A wrote: Reason for CRM: Patient called in requesting callback from provider regarding reschedule of ultrasound appointment. Patient is asking for immediate call back. Please contact patient at [phone].

## 2023-10-19 DIAGNOSIS — K7460 Unspecified cirrhosis of liver: Secondary | ICD-10-CM | POA: Diagnosis not present

## 2023-11-15 ENCOUNTER — Telehealth: Payer: Self-pay | Admitting: *Deleted

## 2023-11-15 NOTE — Telephone Encounter (Signed)
 I called [phone]. Also after further checking pt's chart ; U/S was done 3/25 as pt had stated and result is in the 3/25 encounter [facility]. Sending pt's request to her doctor to review the results.

## 2023-11-15 NOTE — Telephone Encounter (Signed)
 I do not see test result in pt's chart. I called pt who stated she had an U/S of her liver at the [facility] [place] on 3/25. And requesting result.

## 2023-11-15 NOTE — Telephone Encounter (Signed)
 Copied from CRM [id number]. Topic: General - Other >> Nov 12, 2023  9:40 AM Adrianna P wrote: Reason for CRM: Patient calling about update on ultrasound, she never heard anything back.  Tel: [phone]

## 2023-11-15 NOTE — Telephone Encounter (Signed)
 Copied from CRM [id number]. Topic: General - Other >> Nov 12, 2023  9:40 AM Adrianna P wrote: Reason for CRM: Patient calling about update on ultrasound, she never heard anything back.  Tel: [phone] >> Nov 15, 2023 10:00 AM Tisa Forester wrote: Please contact patient to follow up status of request \ [phone]  >> Nov 15, 2023  9:58 AM Tisa Forester wrote: [facility] did ultrasound  on patient and was done on 10/19/23 , and waiting for Rayann n Atway to send them a fax number  to send the ultrasound results  can call [facility] [phone] for further questions

## 2023-12-27 ENCOUNTER — Ambulatory Visit (INDEPENDENT_AMBULATORY_CARE_PROVIDER_SITE_OTHER): Payer: Self-pay | Admitting: Internal Medicine

## 2023-12-27 VITALS — BP 161/79 | HR 57 | Temp 99.0°F | Ht 66.0 in | Wt 152.0 lb

## 2023-12-27 DIAGNOSIS — R748 Abnormal levels of other serum enzymes: Secondary | ICD-10-CM

## 2023-12-27 DIAGNOSIS — L080 Pyoderma: Secondary | ICD-10-CM

## 2023-12-27 DIAGNOSIS — I10 Essential (primary) hypertension: Secondary | ICD-10-CM | POA: Diagnosis not present

## 2023-12-27 NOTE — Progress Notes (Signed)
 Subjective:  CC: hand rash  HPI:  [name] is a 68 y.o. female with a past medical history of HTN, history of elevated liver enzymes, who presents today for several week history of rash on bilateral hands.   Rash  The rash is on the palmar surface of her hands. It first appears as a small white dot that is painful. She has difficulty with opening jars or picking up objects due to pain. Over time the lesions changes to a darker color and feel less painful. She has tried OTC cream (unsure what type) and this has not helped. She denies fever, weight loss, and fatigue.  She also notes concern for circulation in her feet. When she sits or stands for a long amount of time she feels like they feel cold however when she feels them with her hands they feel warm. She denies pain in calves with ambulation. Sensation does not feel like it radiates down her pack into her legs. She does not feel like the sensation in her feet has changed.   Please see problem based assessment and plan for additional details.  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 Anemia panel in 03/2010 showing Iron 53 , TIBC 303 , iron saturation percentage 17, ferritin 197 , B12 and folate within normal limits.   Paronychia of toe, right 11/03/2018   Postmenopausal status    Subjective vision disturbance, left eye 03/17/2018   Tobacco abuse    Quit 2009, after approximately 15 pack year smoking history.   Umbilical hernia    S/P repair approximately 1995   Urine abnormality 05/13/2017    MEDICATIONS:   Spironolactone  100 mg every day Carvedilol  12.5 mg bid Olmesartan  40 mg qd  Family History  Problem Relation Age of Onset   Hypertension Mother    Diabetes Father    Hypertension Father    Hypertension Sister    Heart attack Brother 13   Breast cancer Neg Hx     Past Surgical History:  Procedure Laterality Date   APPENDECTOMY  approximately 1991   TOTAL ABDOMINAL HYSTERECTOMY  03/1996   2/2 fibroids   UMBILICAL HERNIA REPAIR  approximately 1995     Social History   Socioeconomic History   Marital status: Single    Spouse name: Not on file   Number of children: 0   Years of education: 12th grade   Highest education level: High school graduate  Occupational History   Occupation: Copywriter, advertising: UNEMPLOYED  Tobacco Use   Smoking status: Former    Current packs/day: 0.00    Average packs/day: 0.4 packs/day for 30.0 years (12.0 ttl pk-yrs)    Types: Cigarettes    Start date: 04/11/1978    Quit date: 04/11/2008    Years since quitting: 15.7   Smokeless tobacco: Never  Vaping Use   Vaping status: Never Used  Substance and Sexual Activity   Alcohol use: Yes    Alcohol/week: 0.0 standard drinks of alcohol  Comment: occassional glass of wine   Drug use: No   Sexual activity: Not Currently    Birth control/protection: Surgical  Other Topics Concern   Not on file  Social History Narrative   Unemployed. Intermittently worked, Engineer, mining, Copy.    Graduated from high school, [employer].   Single.   No children.   [place] card.   Social Drivers of Corporate investment banker Strain: Low Risk  (04/29/2023)   Overall Financial Resource Strain (CARDIA)    Difficulty of Paying Living Expenses: Not hard at all  Food Insecurity: No Food Insecurity (04/29/2023)   Hunger Vital Sign    Worried About Running Out of Food in the Last Year: Never true    Ran Out of Food in the Last Year: Never true  Transportation Needs: No Transportation Needs  (04/29/2023)   PRAPARE - Administrator, Civil Service (Medical): No    Lack of Transportation (Non-Medical): No  Physical Activity: Sufficiently Active (04/29/2023)   Exercise Vital Sign    Days of Exercise per Week: 7 days    Minutes of Exercise per Session: 30 min  Stress: No Stress Concern Present (04/29/2023)   [employer] of Occupational Health - Occupational Stress Questionnaire    Feeling of Stress : Not at all  Social Connections: Socially Isolated (04/29/2023)   Social Connection and Isolation Panel [NHANES]    Frequency of Communication with Friends and Family: Once a week    Frequency of Social Gatherings with Friends and Family: Once a week    Attends Religious Services: More than 4 times per year    Active Member of [employer] or Organizations: No    Attends Banker Meetings: Never    Marital Status: Never married  Intimate Partner Violence: Not At Risk (04/29/2023)   Humiliation, Afraid, Rape, and Kick questionnaire    Fear of Current or Ex-Partner: No    Emotionally Abused: No    Physically Abused: No    Sexually Abused: No    Review of Systems: ROS negative except for what is noted on the assessment and plan.  Objective:   Vitals:   12/27/23 0849 12/27/23 0940  BP: (!) 147/93 (!) 161/79  Pulse: 61 (!) 57  Temp: 99 F (37.2 C)   TempSrc: Oral   SpO2: 100%   Weight: 152 lb (68.9 kg)   Height: 5\' 6"  (1.676 m)     Physical Exam: Constitutional: well-appearing, in no acute distress Cardiovascular: regular rate and rhythm, no m/r/g Pulmonary/Chest: normal work of breathing on room air, lungs clear to auscultation bilaterally MSK: able to stand without use of hand to push off seat, gait with short slow steps, good balance, takes several steps to be able to turn around Bilateral DP pulse 2+, feet are warm without rash Neurological: normal sensation in bilateral lower extremities Skin: pustules without surrounding erythema present to  palmar surface of hands bilaterally, no excoriations present, hyperpigmentation surrounding pustule on left index finger    Assessment & Plan:   Pustular rash Presenting with several week history of non pruritic pustular rash of bilateral palmar surface.  Differentials include palmoplantar pustulosis although she has not history of psoriasis.  P: Referral to dermatology placed.   Essential hypertension She was last seen in clinic 12/24 for elevated blood pressure. Spironolactone  was increased at that time and ARB/ BB continued. Carvedilol  12.5 mg bid, olmesartan  40 mg, and spironolactone  100 mg every day. She endorses adherence with  medications and refill history reflects that. She has been checking her home blood pressure daily since change in December and has log with her today. Average systolic at home around 120 with diastolic at 70s. In clinic, BP is consistently elevated at 147/93 to 161/79. P: Continue carvedilol  12.5 mg bid, olmesartan  40 mg, and spironolactone  100 mg every day Check CMP for potassium with increased dose of MRA.  Elevated liver enzymes Prior history of transaminitis with concerns for cirrhosis. Ultrasound completed and did not show cirrhosis but did have some concerns for fibrosis. P: Repeat CMP with CBC to complete fib-4 score to determine if elastrography is indicated.    Patient discussed with Dr. Loman Risk Cindia Hustead, D.O. [facility]  PGY-3 Pager: [phone]  Phone: [phone] Date 12/27/2023  Time 12:51 PM

## 2023-12-27 NOTE — Assessment & Plan Note (Signed)
>>  ASSESSMENT AND PLAN FOR ELEVATED LIVER ENZYMES WRITTEN ON 12/27/2023 12:51 PM BY MASTERS, KATIE, DO  Prior history of transaminitis with concerns for cirrhosis. Ultrasound completed and did not show cirrhosis but did have some concerns for fibrosis. P: Repeat CMP with CBC to complete fib-4 score to determine if elastrography is indicated.

## 2023-12-27 NOTE — Assessment & Plan Note (Signed)
 Presenting with several week history of non pruritic pustular rash of bilateral palmar surface.  Differentials include palmoplantar pustulosis although she has not history of psoriasis.  P: Referral to dermatology placed.

## 2023-12-27 NOTE — Assessment & Plan Note (Signed)
 Prior history of transaminitis with concerns for cirrhosis. Ultrasound completed and did not show cirrhosis but did have some concerns for fibrosis. P: Repeat CMP with CBC to complete fib-4 score to determine if elastrography is indicated.

## 2023-12-27 NOTE — Progress Notes (Signed)
 Internal Medicine Clinic Attending  Case discussed with the resident at the time of the visit.  We reviewed the resident's history and exam and pertinent patient test results.  I agree with the assessment, diagnosis, and plan of care documented in the resident's note.

## 2023-12-27 NOTE — Patient Instructions (Addendum)
 Thank you, Jenna May for allowing us  to provide your care today.   Hand rash I am referring you to dermatology for your rash. I recommend trying over the counter voltaren gel to see if this helps with the pain that you are having. If you start having fevers, weight loss, or increasing fatigue then please come back to clinic sooner.  Blood pressure Your blood pressure looks great at home. Please continue current medications. I am checking lab work.  Walking I do think that going to physical therapy would be helpful once we get the rash feeling better.  I have ordered the following labs for you:  Lab Orders         CMP14 + Anion Gap         CBC no Diff       I have ordered the following medication/changed the following medications:   Stop the following medications: There are no discontinued medications.   Start the following medications: No orders of the defined types were placed in this encounter.    Follow up:  3 months  We look forward to seeing you next time. Please call our clinic at [phone] if you have any questions or concerns. The best time to call is Monday-Friday from 9am-4pm, but there is someone available 24/7. If after hours or the weekend, call the main hospital number and ask for the Internal Medicine Resident On-Call. If you need medication refills, please notify your pharmacy one week in advance and they will send us  a request.   Thank you for trusting me with your care. Wishing you the best!   Karalee Oscar, DO [facility]

## 2023-12-27 NOTE — Assessment & Plan Note (Signed)
 She was last seen in clinic 12/24 for elevated blood pressure. Spironolactone  was increased at that time and ARB/ BB continued. Carvedilol  12.5 mg bid, olmesartan  40 mg, and spironolactone  100 mg every day. She endorses adherence with medications and refill history reflects that. She has been checking her home blood pressure daily since change in December and has log with her today. Average systolic at home around 120 with diastolic at 70s. In clinic, BP is consistently elevated at 147/93 to 161/79. P: Continue carvedilol  12.5 mg bid, olmesartan  40 mg, and spironolactone  100 mg every day Check CMP for potassium with increased dose of MRA.

## 2023-12-28 ENCOUNTER — Ambulatory Visit: Payer: Self-pay | Admitting: Internal Medicine

## 2023-12-28 DIAGNOSIS — D649 Anemia, unspecified: Secondary | ICD-10-CM

## 2023-12-28 DIAGNOSIS — E875 Hyperkalemia: Secondary | ICD-10-CM

## 2023-12-28 NOTE — Telephone Encounter (Signed)
 I called and talked with her about lab results from 6/2 showing K at 5.7 with worsening renal function. P: Hold spironolactone  100 mg and increase water intake. Follow-up Friday morning for lab only visit.

## 2023-12-29 NOTE — Addendum Note (Signed)
 Addended by: Manfred Seed on: 12/29/2023 09:58 AM   Modules accepted: Orders

## 2023-12-29 NOTE — Addendum Note (Signed)
 Addended by: Bishop Bullock on: 12/29/2023 09:36 AM   Modules accepted: Orders

## 2023-12-29 NOTE — Addendum Note (Signed)
 Addended by: Manfred Seed on: 12/29/2023 10:08 AM   Modules accepted: Orders

## 2023-12-30 LAB — CMP14 + ANION GAP
ALT: 117 IU/L — ABNORMAL HIGH (ref 0–32)
AST: 98 IU/L — ABNORMAL HIGH (ref 0–40)
Albumin: 4.2 g/dL (ref 3.9–4.9)
Alkaline Phosphatase: 145 IU/L — ABNORMAL HIGH (ref 44–121)
BUN/Creatinine Ratio: 25 (ref 12–28)
BUN: 32 mg/dL — ABNORMAL HIGH (ref 8–27)
Bilirubin Total: 0.3 mg/dL (ref 0.0–1.2)
Calcium: 9.1 mg/dL (ref 8.7–10.3)
Chloride: 100 mmol/L (ref 96–106)
Creatinine, Ser: 1.28 mg/dL — ABNORMAL HIGH (ref 0.57–1.00)
Globulin, Total: 3.9 g/dL (ref 1.5–4.5)
Glucose: 99 mg/dL (ref 70–99)
Potassium: 5.7 mmol/L — ABNORMAL HIGH (ref 3.5–5.2)
Sodium: 130 mmol/L — ABNORMAL LOW (ref 134–144)
Total Protein: 8.1 g/dL (ref 6.0–8.5)
eGFR: 46 mL/min/{1.73_m2} — ABNORMAL LOW (ref >59)

## 2023-12-30 LAB — CBC
Hematocrit: 27.3 % — ABNORMAL LOW (ref 34.0–46.6)
Hemoglobin: 8.6 g/dL — ABNORMAL LOW (ref 11.1–15.9)
MCH: 29.5 pg (ref 26.6–33.0)
MCHC: 31.5 g/dL (ref 31.5–35.7)
MCV: 94 fL (ref 79–97)
Platelets: 128 10*3/uL — ABNORMAL LOW (ref 150–450)
RBC: 2.92 x10E6/uL — ABNORMAL LOW (ref 3.77–5.28)
RDW: 12.9 % (ref 11.7–15.4)
WBC: 5.6 10*3/uL (ref 3.4–10.8)

## 2023-12-31 ENCOUNTER — Telehealth: Payer: Self-pay | Admitting: Internal Medicine

## 2023-12-31 ENCOUNTER — Other Ambulatory Visit: Payer: Self-pay | Admitting: Family Medicine

## 2023-12-31 ENCOUNTER — Telehealth: Payer: Self-pay | Admitting: *Deleted

## 2023-12-31 ENCOUNTER — Ambulatory Visit: Payer: Self-pay

## 2023-12-31 ENCOUNTER — Other Ambulatory Visit (INDEPENDENT_AMBULATORY_CARE_PROVIDER_SITE_OTHER)

## 2023-12-31 DIAGNOSIS — D649 Anemia, unspecified: Secondary | ICD-10-CM | POA: Diagnosis not present

## 2023-12-31 DIAGNOSIS — E875 Hyperkalemia: Secondary | ICD-10-CM

## 2023-12-31 DIAGNOSIS — E785 Hyperlipidemia, unspecified: Secondary | ICD-10-CM | POA: Diagnosis not present

## 2023-12-31 LAB — BASIC METABOLIC PANEL WITH GFR
BUN/Creatinine Ratio: 20 (ref 12–28)
BUN: 19 mg/dL (ref 8–27)
CO2: 15 mmol/L — ABNORMAL LOW (ref 20–29)
Calcium: 8.7 mg/dL (ref 8.7–10.3)
Chloride: 88 mmol/L — ABNORMAL LOW (ref 96–106)
Creatinine, Ser: 0.95 mg/dL (ref 0.57–1.00)
Glucose: 88 mg/dL (ref 70–99)
Potassium: 4.7 mmol/L (ref 3.5–5.2)
Sodium: 117 mmol/L — CL (ref 134–144)
eGFR: 66 mL/min/{1.73_m2} (ref >59)

## 2023-12-31 LAB — CBC
Hematocrit: 24.2 % — ABNORMAL LOW (ref 34.0–46.6)
Hemoglobin: 7.9 g/dL — ABNORMAL LOW (ref 11.1–15.9)
MCH: 28.6 pg (ref 26.6–33.0)
MCHC: 32.6 g/dL (ref 31.5–35.7)
MCV: 88 fL (ref 79–97)
Platelets: 109 10*3/uL — ABNORMAL LOW (ref 150–450)
RBC: 2.76 x10E6/uL — ABNORMAL LOW (ref 3.77–5.28)
RDW: 12 % (ref 11.7–15.4)
WBC: 4.8 10*3/uL (ref 3.4–10.8)

## 2023-12-31 NOTE — Telephone Encounter (Addendum)
 Received call from [facility] regarding abnormal lab Sodium 117  Will forward to afternoon MD to address   (Results have not yet interfaced to epic at the time of this message )

## 2023-12-31 NOTE — Telephone Encounter (Signed)
 Jenna May is a 68 year old with PMH of hyponatremia, normocytic anemia, HTN and elevated liver enzymes who presented to clinic on 6/2 with symptoms of rash on hands and 1 year history of cold sensation in feet and decreased energy. Spironolactone  was increased in 12/24 and she was concerned the BP meds were causing feeling.  BMP 6/2 showed K of 5.7 with increased creatinine. She was advised to stop MRA and increase water intake.  Stat BMP 6/6 showed K and creatinine improved however Na decreased from 130 to 117. I was unable to reach her at 12:30 today and reached her at 4:20 PM. She had already spoken with E2C2 RN and understood recommendations to come to ED however planned to come in at 6 am Saturday morning.  I reviewed blood work with her and that her sodium is at a dangerously low level and it is life-threatening and puts her at risk of having a seizure. She thought RN had said sodium was elevated and was very upset that no one had told her before her Na was low as she had been having decreased energy and cold sensation for more than a year.  P: I conveyed medication recommendation to be her going to ED immediately, today. She endorsed understanding and said she will have her brother bring her tomorrow morning.  She does have history of hyponatermia in the past with Na as low as 120s. Also will need work up for worsening anemia. Hgb was 11.4 in 2022 and was 8.6 on Monday. Patient denied blood in stool or dark stools. Hgb lower today at 7.9 with worsening thrombocytopenia as well as decreased hematocrit.

## 2023-12-31 NOTE — Telephone Encounter (Signed)
 FYI Only or Action Required?: FYI only for provider  Patient was last seen in primary care on 12/27/2023 by Karalee Oscar, DO. Called Nurse Triage reporting Advice Only. Symptoms began several days ago. Interventions attempted: Nothing. Symptoms are: unchanged.  Triage Disposition: Information or Advice Only Call  Patient/caregiver understands and will follow disposition?: Yes                            Copied from CRM [id number]. Topic: Clinical - Red Word Triage >> Dec 31, 2023 12:45 PM Brittney F wrote: [facility] that prompted transfer to Nurse Triage:   Critical lab results Reason for Disposition  Health Information question, no triage required and triager able to answer question  Answer Assessment - Initial Assessment Questions 1. REASON FOR CALL or QUESTION: "What is your reason for calling today?" or "How can I best help you?" or "What question do you have that I can help answer?"        Patient called in because she was returning a call from her provider. A critical sodium level of 117 came in today.   Per chart from provider, "High Priority Critical lab just came in of Sodium 117. Sodium was 130 on 6/2 OV. Per staff, during lab-only visit today she reported continued to be cold and fatigue like how she was on OV on 6/2. Concern for worsening hyponatremia and needs to seek evaluation and repeat BMP at nearest ED. Attempted to call patient x 2 and left voicemail to call back."  This RN read provider's message to patient and advised patient to seek evaluation at ED. Patient verbalized understanding. Patient stated she would try and have her brother take her today, if not tomorrow morning. Advised patient to seek care at the ED today, if possible. Patient verbalized understanding.  Protocols used: Information Only Call - No Triage-A-AH

## 2023-12-31 NOTE — Telephone Encounter (Signed)
 Spoke with Dr Jari Merles, He will contact pt with further instructions. No further action needed on CMA part, phone call complete.Jenna Maniaci Cassady6/6/202512:37 PM

## 2024-01-01 ENCOUNTER — Observation Stay (HOSPITAL_COMMUNITY)
Admission: EM | Admit: 2024-01-01 | Discharge: 2024-01-03 | Disposition: A | Discharge status: Home / Self Care | Attending: Internal Medicine | Admitting: Internal Medicine

## 2024-01-01 ENCOUNTER — Encounter (HOSPITAL_COMMUNITY): Payer: Self-pay

## 2024-01-01 ENCOUNTER — Other Ambulatory Visit: Payer: Self-pay

## 2024-01-01 DIAGNOSIS — R799 Abnormal finding of blood chemistry, unspecified: Secondary | ICD-10-CM | POA: Diagnosis present

## 2024-01-01 DIAGNOSIS — R7401 Elevation of levels of liver transaminase levels: Secondary | ICD-10-CM | POA: Insufficient documentation

## 2024-01-01 DIAGNOSIS — D500 Iron deficiency anemia secondary to blood loss (chronic): Secondary | ICD-10-CM | POA: Insufficient documentation

## 2024-01-01 DIAGNOSIS — E8729 Other acidosis: Secondary | ICD-10-CM | POA: Insufficient documentation

## 2024-01-01 DIAGNOSIS — E871 Hypo-osmolality and hyponatremia: Principal | ICD-10-CM | POA: Insufficient documentation

## 2024-01-01 DIAGNOSIS — F1090 Alcohol use, unspecified, uncomplicated: Secondary | ICD-10-CM | POA: Insufficient documentation

## 2024-01-01 DIAGNOSIS — D649 Anemia, unspecified: Secondary | ICD-10-CM | POA: Diagnosis not present

## 2024-01-01 DIAGNOSIS — I10 Essential (primary) hypertension: Secondary | ICD-10-CM | POA: Insufficient documentation

## 2024-01-01 LAB — LIPID PANEL
Cholesterol: 83 mg/dL (ref 0–200)
HDL: 26 mg/dL — ABNORMAL LOW (ref >40)
LDL Cholesterol: 27 mg/dL (ref 0–99)
Total CHOL/HDL Ratio: 3.2 ratio
Triglycerides: 149 mg/dL (ref <150)
VLDL: 30 mg/dL (ref 0–40)

## 2024-01-01 LAB — CBC WITH DIFFERENTIAL/PLATELET
Abs Immature Granulocytes: 0.02 10*3/uL (ref 0.00–0.07)
Basophils Absolute: 0 10*3/uL (ref 0.0–0.1)
Basophils Relative: 0 %
Eosinophils Absolute: 0 10*3/uL (ref 0.0–0.5)
Eosinophils Relative: 1 %
HCT: 25 % — ABNORMAL LOW (ref 36.0–46.0)
Hemoglobin: 8.3 g/dL — ABNORMAL LOW (ref 12.0–15.0)
Immature Granulocytes: 0 %
Lymphocytes Relative: 40 %
Lymphs Abs: 1.9 10*3/uL (ref 0.7–4.0)
MCH: 29.5 pg (ref 26.0–34.0)
MCHC: 33.2 g/dL (ref 30.0–36.0)
MCV: 89 fL (ref 80.0–100.0)
Monocytes Absolute: 0.4 10*3/uL (ref 0.1–1.0)
Monocytes Relative: 9 %
Neutro Abs: 2.3 10*3/uL (ref 1.7–7.7)
Neutrophils Relative %: 50 %
Platelets: 134 10*3/uL — ABNORMAL LOW (ref 150–400)
RBC: 2.81 MIL/uL — ABNORMAL LOW (ref 3.87–5.11)
RDW: 12.6 % (ref 11.5–15.5)
WBC: 4.6 10*3/uL (ref 4.0–10.5)
nRBC: 0 % (ref 0.0–0.2)

## 2024-01-01 LAB — URINALYSIS, ROUTINE W REFLEX MICROSCOPIC
Bilirubin Urine: NEGATIVE
Glucose, UA: NEGATIVE mg/dL
Hgb urine dipstick: NEGATIVE
Ketones, ur: NEGATIVE mg/dL
Leukocytes,Ua: NEGATIVE
Nitrite: NEGATIVE
Protein, ur: NEGATIVE mg/dL
Specific Gravity, Urine: 1.005 (ref 1.005–1.030)
pH: 5 (ref 5.0–8.0)

## 2024-01-01 LAB — COMPREHENSIVE METABOLIC PANEL WITH GFR
ALT: 96 U/L — ABNORMAL HIGH (ref 0–44)
AST: 94 U/L — ABNORMAL HIGH (ref 15–41)
Albumin: 3.5 g/dL (ref 3.5–5.0)
Alkaline Phosphatase: 101 U/L (ref 38–126)
Anion gap: 10 (ref 5–15)
BUN: 20 mg/dL (ref 8–23)
CO2: 16 mmol/L — ABNORMAL LOW (ref 22–32)
Calcium: 9 mg/dL (ref 8.9–10.3)
Chloride: 92 mmol/L — ABNORMAL LOW (ref 98–111)
Creatinine, Ser: 1.15 mg/dL — ABNORMAL HIGH (ref 0.44–1.00)
GFR, Estimated: 53 mL/min — ABNORMAL LOW (ref >60)
Glucose, Bld: 98 mg/dL (ref 70–99)
Potassium: 4.6 mmol/L (ref 3.5–5.1)
Sodium: 118 mmol/L — CL (ref 135–145)
Total Bilirubin: 0.3 mg/dL (ref 0.0–1.2)
Total Protein: 7.8 g/dL (ref 6.5–8.1)

## 2024-01-01 LAB — BASIC METABOLIC PANEL WITH GFR
Anion gap: 10 (ref 5–15)
BUN: 19 mg/dL (ref 8–23)
CO2: 15 mmol/L — ABNORMAL LOW (ref 22–32)
Calcium: 9 mg/dL (ref 8.9–10.3)
Chloride: 97 mmol/L — ABNORMAL LOW (ref 98–111)
Creatinine, Ser: 0.97 mg/dL (ref 0.44–1.00)
GFR, Estimated: >60 mL/min (ref >60)
Glucose, Bld: 92 mg/dL (ref 70–99)
Potassium: 3.8 mmol/L (ref 3.5–5.1)
Sodium: 122 mmol/L — ABNORMAL LOW (ref 135–145)

## 2024-01-01 LAB — TSH: TSH: 5.203 u[IU]/mL — ABNORMAL HIGH (ref 0.350–4.500)

## 2024-01-01 LAB — POC OCCULT BLOOD, ED: Fecal Occult Bld: NEGATIVE

## 2024-01-01 LAB — OSMOLALITY, URINE: Osmolality, Ur: 168 mosm/kg — ABNORMAL LOW (ref 300–900)

## 2024-01-01 LAB — MAGNESIUM: Magnesium: 1.4 mg/dL — ABNORMAL LOW (ref 1.7–2.4)

## 2024-01-01 LAB — OSMOLALITY: Osmolality: 263 mosm/kg — ABNORMAL LOW (ref 275–295)

## 2024-01-01 MED ORDER — ENOXAPARIN SODIUM 40 MG/0.4ML IJ SOSY
40.0000 mg | PREFILLED_SYRINGE | INTRAMUSCULAR | Status: DC
  Administered 2024-01-01 – 2024-01-02 (×2): 40 mg via SUBCUTANEOUS
  Filled 2024-01-01 (×2): qty 0.4

## 2024-01-01 MED ORDER — MAGNESIUM SULFATE 2 GM/50ML IV SOLN
2.0000 g | Freq: Once | INTRAVENOUS | Status: AC
  Administered 2024-01-01: 2 g via INTRAVENOUS
  Filled 2024-01-01: qty 50

## 2024-01-01 MED ORDER — ACETAMINOPHEN 325 MG PO TABS: 650.0000 mg | ORAL_TABLET | Freq: Four times a day (QID) | ORAL | Status: DC | PRN

## 2024-01-01 MED ORDER — IRBESARTAN 300 MG PO TABS
300.0000 mg | ORAL_TABLET | Freq: Every day | ORAL | Status: DC
  Administered 2024-01-02 – 2024-01-03 (×2): 300 mg via ORAL
  Filled 2024-01-01 (×2): qty 1

## 2024-01-01 MED ORDER — CARVEDILOL 12.5 MG PO TABS
12.5000 mg | ORAL_TABLET | Freq: Two times a day (BID) | ORAL | Status: DC
  Administered 2024-01-02 – 2024-01-03 (×3): 12.5 mg via ORAL
  Filled 2024-01-01 (×4): qty 1

## 2024-01-01 MED ORDER — ACETAMINOPHEN 650 MG RE SUPP: 650.0000 mg | Freq: Four times a day (QID) | RECTAL | Status: DC | PRN

## 2024-01-01 MED ORDER — SODIUM CHLORIDE 0.9 % IV BOLUS
1000.0000 mL | Freq: Once | INTRAVENOUS | Status: AC
  Administered 2024-01-01: 1000 mL via INTRAVENOUS

## 2024-01-01 MED ORDER — SODIUM CHLORIDE 0.9 % IV SOLN: INTRAVENOUS | Status: AC

## 2024-01-01 NOTE — Hospital Course (Addendum)
 Jenna May is a 68 y.o. person living with a history of hyponatremia, normocytic anemia, hypertension, elevated liver enzymes who presents to ED today after being told by her PCP to come to ED to get evaluated for hyponatremia, admitted for further work up.    Hypovolemic Hyponatremia   Presented with sodium 118 (117 - checked at PCP), serum Osm 263. Glucose 98. No reported GI losses. No urinary symptoms. Drinks alcohol daily about 1-2 glass. Endorses good appetite. She is not on any medications leading to hyponatremia. Appears euvolemic on exam, no hx of chf. Urine Osm 168, low. In the ED, patient received 1 L NS, vitals stable, mentation well. Etiology multifactorial, can be alcohol induced vs poor po intake vs SIADH vs hypothyroidism. Will need to do further work up:  -UA with findings negative for infection  -TSH mildly elevated to 5.203, can be subclinical hypothyroidism.  -Follow-up urine sodium -Follow up on Lipid panel  -Will need to follow up on BMP after 1 L NS given in ED             - Will plan on slowly correcting Na with ~ 6 units/ day - avoid agressive correction    Normocytic anemia Hgb 8.3, MCV 89.0. FOBT negative per ED provider. Denies any overt bleed. Last colonoscopy 2010 with normal findings, due for repeat. She had outpatient Iron, TIBC, and ferritin labs collected. Results pending. Otherwise, asymptomatic.  - Monitor CBC   Hypertension Home medication consist of Coreg  12.5 mg twice daily and olmesartan  40 mg daily. Reports that she has not taken spironolactone  since her OV with PCP on 6/4. She took her BP medications this AM - Continue home medications Coreg  12.5 mg BID and irbesartan 300 mg (formulary for olmesartan  40 mg )     Elevated liver enzymes Has a prior history of transaminitis with concerns for cirrhosis. RUQ US  2021 negative for cirrhosis. She denies any abdominal pain.  - Follow up outpatient with PCP      ================================================= Jenna May,  You came to the hospital for low Na.   These are the changes to medications:     If you have any of these following symptoms, please call us  or seek care at an emergency department: -Chest Pain -Difficulty Breathing -Worsening abdominal pain -Syncope (passing out) -Drooping of face -Slurred speech -Sudden weakness in your leg or arm -Fever -Chills -blood in the stool -dark black, sticky stool  If you have any questions or concerns, call our clinic at [phone], [phone] or after hours call [phone] and ask for the internal medicine resident on call.  I am glad you are feeling better. It was a pleasure taking care for you. I wish a good recovery and good health!   Dr. Lanney Pitts    ============================================================== Disposition:    06/08: Feeling well but didn't eat last night and food hasn't come this morning either. Is happy to hear her sodium is getting better.

## 2024-01-01 NOTE — Plan of Care (Signed)
  Problem: Education: Goal: Knowledge of General Education information will improve Description: Including pain rating scale, medication(s)/side effects and non-pharmacologic comfort measures Outcome: Progressing   Problem: Health Behavior/Discharge Planning: Goal: Ability to manage health-related needs will improve Outcome: Progressing   Problem: Clinical Measurements: Goal: Will remain free from infection Outcome: Progressing   Problem: Clinical Measurements: Goal: Respiratory complications will improve Outcome: Progressing   Problem: Clinical Measurements: Goal: Cardiovascular complication will be avoided Outcome: Progressing   Problem: Nutrition: Goal: Adequate nutrition will be maintained Outcome: Progressing   Problem: Coping: Goal: Level of anxiety will decrease Outcome: Progressing   Problem: Pain Managment: Goal: General experience of comfort will improve and/or be controlled Outcome: Progressing   Problem: Elimination: Goal: Will not experience complications related to bowel motility Outcome: Progressing   Problem: Elimination: Goal: Will not experience complications related to urinary retention Outcome: Progressing   Problem: Pain Managment: Goal: General experience of comfort will improve and/or be controlled Outcome: Progressing   Problem: Safety: Goal: Ability to remain free from injury will improve Outcome: Progressing

## 2024-01-01 NOTE — Plan of Care (Signed)
  Problem: Clinical Measurements: Goal: Ability to maintain clinical measurements within normal limits will improve Outcome: Progressing   Problem: Clinical Measurements: Goal: Will remain free from infection Outcome: Progressing   Problem: Clinical Measurements: Goal: Diagnostic test results will improve Outcome: Progressing   Problem: Clinical Measurements: Goal: Cardiovascular complication will be avoided Outcome: Progressing   Problem: Elimination: Goal: Will not experience complications related to urinary retention Outcome: Progressing   Problem: Safety: Goal: Ability to remain free from injury will improve Outcome: Progressing

## 2024-01-01 NOTE — Progress Notes (Signed)
 Patient transferred from ED via bed.  On RA. Alert and Oriented. Will continue to monitor

## 2024-01-01 NOTE — ED Provider Triage Note (Signed)
 Emergency Medicine Provider Triage Evaluation Note  Jenna May , a 68 y.o. female  was evaluated in triage.  Pt complains of abnormal lab. Reports daily drinking for several years, thinks 2-3 drinks a day. Denies NVD, tolerating oral intake.   Review of Systems  Positive: Generalized weakness Negative: SOB  Physical Exam  BP (!) 152/70 (BP Location: Right Arm)   Pulse 60   Temp 98.3 F (36.8 C)   Resp 16   Ht 5\' 6"  (1.676 m)   Wt 68.9 kg   LMP 09/29/1995   SpO2 100%   BMI 24.53 kg/m  Gen:   Awake, no distress   Resp:  Normal effort  MSK:   Moves extremities without difficulty  Other:  Generalized weakness, no focal deficit   Medical Decision Making  Medically screening exam initiated at 10:29 AM.  Appropriate orders placed.  Jenna May was informed that the remainder of the evaluation will be completed by another provider, this initial triage assessment does not replace that evaluation, and the importance of remaining in the ED until their evaluation is complete.     Jenna Heron, PA-C 01/01/24 1030

## 2024-01-01 NOTE — Progress Notes (Incomplete)
 HD#0 Subjective:   Summary: Jenna May is a 68 y.o. female with PMH of hyponatremia, normocytic anemia, hypertension, elevated liver enzymes who presents to ED today after being told by her PCP to come to ED to get evaluated for hyponatremia  Overnight Events: None   Interim history: ***  Objective:  Vital signs in last 24 hours: Vitals:   01/01/24 1300 01/01/24 1430 01/01/24 1459 01/01/24 1531  BP: 136/62 (!) 120/57  139/66  Pulse:    (!) 56  Resp: 16 16  18   Temp:   98.1 F (36.7 C) 98.6 F (37 C)  TempSrc:   Oral Oral  SpO2:   100% 100%  Weight:      Height:       Supplemental O2: {NAMES:3044014::"Room Air","Nasal Cannula","Simple Face Mask","Partial Rebreather","HFNC","Non Rebreather","Venturi Mask","Bag Valve Mask"} SpO2: 100 %   Physical Exam:  Constitutional: well-appearing *** sitting in ***, in no acute distress HENT: normocephalic atraumatic, mucous membranes moist Eyes: conjunctiva non-erythematous Neck: supple Cardiovascular: regular rate and rhythm, no m/r/g Pulmonary/Chest: normal work of breathing on room air, lungs clear to auscultation bilaterally Abdominal: soft, non-tender, non-distended MSK: normal bulk and tone Neurological: alert & oriented x 3, 5/5 strength in bilateral upper and lower extremities, normal gait Skin: warm and dry Psych: ***  Filed Weights   01/01/24 0635  Weight: 68.9 kg     Intake/Output Summary (Last 24 hours) at 01/01/2024 1843 Last data filed at 01/01/2024 1700 Gross per 24 hour  Intake 200 ml  Output --  Net 200 ml   Net IO Since Admission: 200 mL [01/01/24 1843]  Pertinent Labs:    Latest Ref Rng & Units 01/01/2024    6:41 AM 12/31/2023    8:50 AM 12/27/2023   10:20 AM  CBC  WBC 4.0 - 10.5 K/uL 4.6  4.8  5.6   Hemoglobin 12.0 - 15.0 g/dL 8.3  7.9  8.6   Hematocrit 36.0 - 46.0 % 25.0  24.2  27.3   Platelets 150 - 400 K/uL 134  109  128        Latest Ref Rng & Units 01/01/2024    3:57 PM 01/01/2024     6:41 AM 12/31/2023    8:50 AM  CMP  Glucose 70 - 99 mg/dL 92  98  88   BUN 8 - 23 mg/dL 19  20  19    Creatinine 0.44 - 1.00 mg/dL 1.61  0.96  0.45   Sodium 135 - 145 mmol/L 122  118  117   Potassium 3.5 - 5.1 mmol/L 3.8  4.6  4.7   Chloride 98 - 111 mmol/L 97  92  88   CO2 22 - 32 mmol/L 15  16  15    Calcium 8.9 - 10.3 mg/dL 9.0  9.0  8.7   Total Protein 6.5 - 8.1 g/dL  7.8    Total Bilirubin 0.0 - 1.2 mg/dL  0.3    Alkaline Phos 38 - 126 U/L  101    AST 15 - 41 U/L  94    ALT 0 - 44 U/L  96      Imaging: No results found.  Assessment/Plan:   Principal Problem:   Hyponatremia   Patient Summary:  Jenna May is a 68 y.o. person living with a history of hyponatremia, normocytic anemia, hypertension, elevated liver enzymes who presents to ED today after being told by her PCP to come to ED to get evaluated for hyponatremia, admitted for further work  up.    Hypovolemic Hyponatremia   Presented with sodium 118 (117 - checked at PCP), serum Osm 263. Glucose 98. No reported GI losses. No urinary symptoms. Drinks alcohol daily about 1-2 glass. Endorses good appetite. She is not on any medications leading to hyponatremia. No hx of chf. Urine Osm 168, low.   -UA with findings negative for infection  -TSH mildly elevated to 5.203, can be subclinical hypothyroidism.  -Follow-up urine sodium -Follow up on Lipid panel  -Will need to follow up on BMP after 1 L NS given in ED             - Will plan on slowly correcting Na with ~ 6 units/ day - avoid agressive correction    Normocytic anemia FOBT negative per ED provider. Denies any overt bleed. Last colonoscopy 2010 with normal findings, due for repeat. She had outpatient Iron, TIBC, and ferritin labs collected. Results pending. Otherwise, asymptomatic.  - Monitor CBC   Hypertension Home medication consist of Coreg  12.5 mg twice daily and olmesartan  40 mg daily. Reports that she has not taken spironolactone  since her OV with PCP on  6/4.  - Continue home medications Coreg  12.5 mg BID and irbesartan 300 mg (formulary for olmesartan  40 mg )     Elevated liver enzymes Has a prior history of transaminitis with concerns for cirrhosis. RUQ US  2021 negative for cirrhosis. She denies any abdominal pain.  - Follow up outpatient with PCP    Alcohol Use Reports drinking a glass of Brandy daily mixed with spirit. Denies any withdrawal symptoms or previous history of seizures. - CIWA without Ativan   Diet: {NAMES:3044014::"Normal","Heart Healthy","Carb-Modified","Renal","Carb/Renal","NPO","TPN","Tube Feeds"} IVF: {NAMES:3044014::"None","NS","1/2 NS","LR","D5","D10"},{NAMES:3044014::"None","10cc/hr","25cc/hr","50cc/hr","75cc/hr","100cc/hr","110cc/hr","125cc/hr","Bolus"} VTE: {NAMES:3044014::"Heparin","Enoxaparin","SCDs","DOAC","None"} Wounds:  Code: {NAMES:3044014::"Full","DNR","DNI","DNR/DNI","Comfort Care","Unknown"} PT/OT recs: {NAMES:3044014::"None","Pending","CIR","SNF for Subacute PT","LTAC","Home Health"}, {Assistive Devices ZOX:09604}. TOC recs: *** Family Update: ***   Dispo: Anticipated discharge to {Discharge Destination:18313::"Home"} in {NUMBERS:20191} days pending ***.   Signature: Lanney Pitts, D.O.  Internal Medicine Resident, PGY-1 [facility]  6:43 PM, 01/01/2024   Please contact the on call pager after 5 pm and on weekends at [phone].

## 2024-01-01 NOTE — ED Provider Notes (Signed)
 [place] EMERGENCY DEPARTMENT AT [facility] Provider Note   CSN: [id number] Arrival date & time: 01/01/24  1478     History  Chief Complaint  Patient presents with   abnormal labs    Jenna May is a 69 y.o. female.     68 year old female with history of alcohol use, anemia, anxiety, hypertension presented to the ED with concerns of abnormal labs.  Patient states she was seen by her PCP yesterday for a regular visit.  She was notified last night that her lab is abnormal.  She was told that her sodium level is low and she needs to come to the ER for further care.  Patient is without any specific complaint.  States that she feels a little bit more sluggish than normal with decreasing energy compared to before when she was placed on 3 different blood pressure medication approximately a year ago.  She attributed to lack of energy due to her blood pressure medication.  She does not endorse any headache, neck pain, chest pain, trouble breathing, abdominal pain, nausea vomiting diarrhea, confusion.  She does admits to alcohol use and states she drank a beverage of alcohol on a daily basis.  She denies any drug use.  Home Medications Prior to Admission medications   Medication Sig Start Date End Date Taking? Authorizing Provider  carvedilol  (COREG ) 12.5 MG tablet Take 1 tablet (12.5 mg total) by mouth 2 (two) times daily. 04/29/23   Atway, Rayann N, DO  fish oil-omega-3 fatty acids  1000 MG capsule Take 2 capsules (2 g total) by mouth daily. 07/12/12   Kalia-Reynolds, Maitri S, DO  fluticasone  (FLONASE ) 50 MCG/ACT nasal spray Place 1 spray into both nostrils daily for 7 days. 08/08/18 08/15/18  Masoudi, Steffan Edison, MD  Menthol-Methyl Salicylate (MUSCLE RUB) 10-15 % CREA Apply 1 application topically as needed for muscle pain.    [provider]  Multiple Vitamin (MULTIVITAMIN WITH MINERALS) TABS tablet Take 1 tablet by mouth daily.    [provider]  olmesartan   (BENICAR ) 40 MG tablet Take 1 tablet by mouth once daily 09/02/23   Atway, Rayann N, DO  spironolactone  (ALDACTONE ) 100 MG tablet Take 1 tablet (100 mg total) by mouth daily. 07/09/23 07/08/24  Atway, Rayann N, DO      Allergies    Amlodipine  and Hydrochlorothiazide     Review of Systems   Review of Systems  All other systems reviewed and are negative.   Physical Exam Updated Vital Signs BP (!) 152/70 (BP Location: Right Arm)   Pulse 60   Temp 98.3 F (36.8 C)   Resp 16   Ht 5\' 6"  (1.676 m)   Wt 68.9 kg   LMP 09/29/1995   SpO2 100%   BMI 24.53 kg/m  Physical Exam Vitals and nursing note reviewed.  Constitutional:      General: She is not in acute distress.    Appearance: She is well-developed.  HENT:     Head: Atraumatic.  Eyes:     Conjunctiva/sclera: Conjunctivae normal.  Cardiovascular:     Rate and Rhythm: Normal rate and regular rhythm.     Pulses: Normal pulses.     Heart sounds: Normal heart sounds.  Pulmonary:     Effort: Pulmonary effort is normal.  Abdominal:     Palpations: Abdomen is soft.     Tenderness: There is no abdominal tenderness.  Genitourinary:    Comments: Chaperone present during exam.  Normal rectal tone, no obvious mass, normal color stool  on glove Musculoskeletal:        General: Normal range of motion.     Cervical back: Neck supple.  Skin:    Findings: No rash.  Neurological:     Mental Status: She is alert and oriented to person, place, and time.  Psychiatric:        Mood and Affect: Mood normal.     ED Results / Procedures / Treatments   Labs (all labs ordered are listed, but only abnormal results are displayed) Labs Reviewed  CBC WITH DIFFERENTIAL/PLATELET - Abnormal; Notable for the following components:      Result Value   RBC 2.81 (*)    Hemoglobin 8.3 (*)    HCT 25.0 (*)    Platelets 134 (*)    All other components within normal limits  COMPREHENSIVE METABOLIC PANEL WITH GFR - Abnormal; Notable for the following  components:   Sodium 118 (*)    Chloride 92 (*)    CO2 16 (*)    Creatinine, Ser 1.15 (*)    AST 94 (*)    ALT 96 (*)    GFR, Estimated 53 (*)    All other components within normal limits  MAGNESIUM  - Abnormal; Notable for the following components:   Magnesium  1.4 (*)    All other components within normal limits  OSMOLALITY - Abnormal; Notable for the following components:   Osmolality 263 (*)    All other components within normal limits  URINALYSIS, ROUTINE W REFLEX MICROSCOPIC  TSH  OSMOLALITY, URINE  POC OCCULT BLOOD, ED    EKG None  Radiology No results found.  Procedures .Critical Care  Performed by: Debbra Fairy, PA-C Authorized by: Debbra Fairy, PA-C   Critical care provider statement:    Critical care time (minutes):  30   Critical care was time spent personally by me on the following activities:  Development of treatment plan with patient or surrogate, discussions with consultants, evaluation of patient's response to treatment, examination of patient, ordering and review of laboratory studies, ordering and review of radiographic studies, ordering and performing treatments and interventions, pulse oximetry, re-evaluation of patient's condition and review of old charts     Medications Ordered in ED Medications  sodium chloride 0.9 % bolus 1,000 mL (0 mLs Intravenous Stopped 01/01/24 1251)    ED Course/ Medical Decision Making/ A&P                                 Medical Decision Making  BP (!) 162/78   Pulse (!) 59   Temp 98.2 F (36.8 C) (Oral)   Resp 16   Ht 5\' 6"  (1.676 m)   Wt 68.9 kg   LMP 09/29/1995   SpO2 99%   BMI 24.53 kg/m   87:45 AM  68 year old female with history of alcohol use, anemia, anxiety, hypertension presented to the ED with concerns of abnormal labs.  Patient states she was seen by her PCP yesterday for a regular visit.  She was notified last night that her lab is abnormal.  She was told that her sodium level is low and she needs  to come to the ER for further care.  Patient is without any specific complaint.  States that she feels a little bit more sluggish than normal with decreasing energy compared to before when she was placed on 3 different blood pressure medication approximately a year ago.  She attributed to lack of  energy due to her blood pressure medication.  She does not endorse any headache, neck pain, chest pain, trouble breathing, abdominal pain, nausea vomiting diarrhea, confusion.  She does admits to alcohol use and states she drank a beverage of alcohol on a daily basis.  She denies any drug use.  On exam patient is resting comfortably appears to be in no acute discomfort.  Heart with normal rate and rhythm, lungs are clear abdomen is soft nontender she has equal strength throughout and she is alert and oriented x 4.  Vital signs with a blood pressure of 152/70.  She is afebrile no hypoxia.  -Labs ordered, independently viewed and interpreted by me.  Labs remarkable for sodium of 118.  Hemoglobin is 8.3, it was 8.6 approximate 5 days ago.  Hemoccult negative. -The patient was maintained on a cardiac monitor.  I personally viewed and interpreted the cardiac monitored which showed an underlying rhythm of: Sinus bradycardia -Imaging not considered at this time -This patient presents to the ED for concern of abnormal lab values, this involves an extensive number of treatment options, and is a complaint that carries with it a high risk of complications and morbidity.  The differential diagnosis includes beer potomania, hypovolemia, hypovolemia, SIADH, lab error, liver cirrhosis -Co morbidities that complicate the patient evaluation includes alcohol use, anemia, anxiety, hypertension -Treatment includes IV fluid -Reevaluation of the patient after these medicines showed that the patient stayed the same -PCP office notes or outside notes reviewed -Discussion with specialist intermittent resident who agrees to see and  will admit patient for hyponatremia -Escalation to admission/observation considered: Patient is agreeable with admission.         Final Clinical Impression(s) / ED Diagnoses Final diagnoses:  Hyponatremia  Anemia, unspecified type    Rx / DC Orders ED Discharge Orders     None         Debbra Fairy, PA-C 01/01/24 1309    Tegeler, Marine Sia, MD 01/01/24 [phone]

## 2024-01-01 NOTE — ED Triage Notes (Signed)
 Pt arrived from home via POV stated that her PCP called her yesterday to come to ED d/t low sodium levels. Pt states that she has been missing the pep in her step lately and seems to be a little colder than usual. Zero other symptoms.

## 2024-01-01 NOTE — H&P (Cosign Needed Addendum)
 Date: 01/01/2024               Patient Name:  Jenna May MRN: [id number]  DOB: 07/21/1956 Age / Sex: 68 y.o., female   PCP: Vicente Graham, DO         Medical Service: Internal Medicine Teaching Service         Attending Physician: Dr. Bevelyn Bryant, MD      First Contact: Dr. Lanney Pitts, DO Pager [phone]    Second Contact: Dr. Malen Scudder, DO         After Hours (After 5p/  First Contact Pager: [phone]  weekends / holidays): Second Contact Pager: [phone]   SUBJECTIVE   Chief Complaint: Told by her PCP that she has hyponatremia, to come to ED for evaluation.  History of Present Illness:   This is a 68 year old female with medical history of hyponatremia, normocytic anemia, hypertension, elevated liver enzymes who presents to ED today after being told by her PCP to come to ED to get evaluated for hyponatremia.  Patient was recently seen by her PCP on 12/29/2023 where she had labs that were done notable for sodium 117, checked on 6/6; patient was advised to come to ED for evaluation.  On evaluation, patient is awake, alert, answering questions appropriately.  She denies any symptoms of nausea, vomiting, diarrhea, abdominal pain, cough, congestion, sore throat.  Denies any recent symptoms of fevers, chills, recent illness.  Patient reports that she eats pretty healthy, loves fruits and vegetables.  Reports that sometimes she eats 2-3 meals a day, reports that she tries to add meat in her diet as well.  Occasionally snacks and has desserts.  Reports that she drinks about 4 glasses of roughly 12 ounces of water daily, with occasional sodas.  Denies any symptoms of dysuria, urinary retention.  Denies any dizziness or lightheadedness.    Patient reports that per her last office visit with PCP, she was told to stop taking spironolactone . Patient reports that she always had cold hands and feet, but that is chronic.  Denies any lower extremity edema, dyspnea or orthopnea.  Denies any  chest pain.  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 Anemia panel in 03/2010 showing Iron 53 , TIBC 303 , iron saturation percentage 17, ferritin 197 , B12 and folate within normal limits.   Paronychia of toe, right 11/03/2018   Postmenopausal status    Subjective vision disturbance, left eye 03/17/2018   Tobacco abuse    Quit 2009, after approximately 15 pack year smoking history.   Umbilical hernia    S/P repair approximately 1995   Urine abnormality 05/13/2017    Meds:  Current Outpatient Medications  Medication Instructions   carvedilol  (COREG ) 12.5 mg, Oral, 2 times daily   fish oil-omega-3 fatty acids  2 g, Oral, Daily   Menthol-Methyl Salicylate (MUSCLE RUB) 10-15 % CREA 1 application , As needed   olmesartan  (BENICAR ) 40 mg, Oral, Daily  spironolactone  (ALDACTONE ) 100 mg, Oral, Daily    Past Surgical History:  Procedure Laterality Date   APPENDECTOMY  approximately 1991   TOTAL ABDOMINAL HYSTERECTOMY  03/1996   2/2 fibroids   UMBILICAL HERNIA REPAIR  approximately 1995    Social:  Living Situation: [place], lives alone Functionality: Independent in ADLs and IADLs, does not drive.  Has a close friend of 44 years who helps her taking her to grocery shopping.  Ambulates without assistive devices. PCP: Atway, Rayann N, DO Tobacco: Reports that she quit several years ago, does not remember how long. Alcohol: Mixed drinks with Brandy with spirit, about 1-2 glasses daily.  Has gone days without drinking alcohol in  the past without any cravings or previous history of seizures.  Family History: noncontributory  Allergies: Allergies as of 01/01/2024 - Review Complete 01/01/2024  Allergen Reaction Noted   Amlodipine  Other (See Comments) 04/26/2012   Hydrochlorothiazide  Other (See Comments) 04/29/2023    Review of Systems: A complete ROS was negative except as per HPI.   OBJECTIVE:   Physical Exam: Blood pressure 136/62, pulse (!) 59, temperature 98.2 F (36.8 C), temperature source Oral, resp. rate 16, height 5\' 6"  (1.676 m), weight 68.9 kg, last menstrual period 09/29/1995, SpO2 99%.   Constitutional: Well-appearing, laying in bed, no acute distress HENT: normocephalic atraumatic, mucous membranes moist Eyes: conjunctiva non-erythematous Cardiovascular: regular rate and rhythm, no m/r/g Pulmonary/Chest: normal work of breathing on room air, lungs clear to auscultation bilaterally Abdominal: soft, non-tender, non-distended, bowel sounds present MSK: normal bulk and tone, no lower extremity edema bilaterally Neurological: Awake, alert, answering questions appropriately, no focal deficits Skin: warm and dry Psych: Pleasant  Labs: CBC    Component Value Date/Time   WBC 4.6 01/01/2024 0641   RBC 2.81 (L) 01/01/2024 0641   HGB 8.3 (L) 01/01/2024 0641   HGB 7.9 (L) 12/31/2023 0850   HCT 25.0 (L) 01/01/2024 0641   HCT 24.2 (L) 12/31/2023 0850   PLT 134 (L) 01/01/2024 0641   PLT 109 (L) 12/31/2023 0850   MCV 89.0 01/01/2024 0641   MCV 88 12/31/2023 0850   MCH 29.5 01/01/2024 0641   MCHC 33.2 01/01/2024 0641   RDW 12.6 01/01/2024 0641   RDW 12.0 12/31/2023 0850   LYMPHSABS 1.9 01/01/2024 0641   LYMPHSABS 3.1 04/24/2019 1713   MONOABS 0.4 01/01/2024 0641   EOSABS 0.0 01/01/2024 0641   EOSABS 0.1 04/24/2019 1713   BASOSABS 0.0 01/01/2024 0641   BASOSABS 0.1 04/24/2019 1713     CMP     Component Value Date/Time   NA 118 (LL) 01/01/2024 0641   NA 117 (LL) 12/31/2023 0850   K 4.6  01/01/2024 0641   CL 92 (L) 01/01/2024 0641   CO2 16 (L) 01/01/2024 0641   GLUCOSE 98 01/01/2024 0641   BUN 20 01/01/2024 0641   BUN 19 12/31/2023 0850   CREATININE 1.15 (H) 01/01/2024 0641   CREATININE 0.63 11/13/2014 1426   CALCIUM 9.0 01/01/2024 0641   PROT 7.8 01/01/2024 0641   PROT 8.1 12/27/2023 1020   ALBUMIN 3.5 01/01/2024 0641   ALBUMIN 4.2 12/27/2023 1020   AST 94 (H) 01/01/2024 0641   ALT 96 (H) 01/01/2024 0641   ALKPHOS 101 01/01/2024 0641   BILITOT 0.3 01/01/2024 0641   BILITOT 0.3 12/27/2023 1020   GFRNONAA 53 (L) 01/01/2024 0641   GFRNONAA >89 11/13/2014 1426   GFRAA 87 12/20/2019 1338   GFRAA >89 11/13/2014 1426    Imaging: No results found.  EKG: None per chart review.  ASSESSMENT & PLAN:   Assessment & Plan by Problem: Principal Problem:   Hyponatremia   Hildegard Outten is a 68 y.o. person living with a history of hyponatremia, normocytic anemia, hypertension, elevated liver enzymes who presents to ED today after being told by her PCP to come to ED to get evaluated for hyponatremia, admitted for further work up.   Hypovolemic Hyponatremia   Presented with sodium 118 (117 - checked at PCP), serum Osm 263. Glucose 98. No reported GI losses. No urinary symptoms. Drinks alcohol daily about 1-2 glass. Endorses good appetite. She is not on any medications leading to hyponatremia. Appears euvolemic on exam, no hx of chf. Urine Osm 168, low. In the ED, patient received 1 L NS, vitals stable, mentation well. Etiology multifactorial, can be alcohol induced vs poor po intake vs SIADH vs hypothyroidism. Will need to do further work up:  -UA with findings negative for infection  -TSH mildly elevated to 5.203, can be subclinical hypothyroidism.  -Follow-up urine sodium -Follow up on Lipid panel  -Will need to follow up on BMP after 1 L NS given in ED  - Will plan on slowly correcting Na with ~ 6 units/ day - avoid agressive correction   Normocytic anemia Hgb 8.3,  MCV 89.0. FOBT negative per ED provider. Denies any overt bleed. Last colonoscopy 2010 with normal findings, due for repeat. She had outpatient Iron, TIBC, and ferritin labs collected. Results pending. Otherwise, asymptomatic.  - Monitor CBC  Hypertension Home medication consist of Coreg  12.5 mg twice daily and olmesartan  40 mg daily. Reports that she has not taken spironolactone  since her OV with PCP on 6/4. She took her BP medications this AM - Continue home medications Coreg  12.5 mg BID and irbesartan 300 mg (formulary for olmesartan  40 mg )    Elevated liver enzymes Has a prior history of transaminitis with concerns for cirrhosis. RUQ US  2021 negative for cirrhosis. She denies any abdominal pain.  - Follow up outpatient with PCP   Alcohol Use Reports drinking a glass of Brandy daily mixed with spirit. Denies any withdrawal symptoms or previous history of seizures. - CIWA without Ativan   Diet: Carb-Modified VTE: Enoxaparin IVF: None  Code: Full per Patient   Prior to Admission Living Arrangement: Home, living self Anticipated Discharge Location: Home Barriers to Discharge: Medical management   Dispo: Admit patient to Observation with expected length of stay less than 2 midnights.  Signed: Lanney Pitts, DO Internal Medicine Resident PGY-1  01/01/2024, 3:05 PM

## 2024-01-02 DIAGNOSIS — E871 Hypo-osmolality and hyponatremia: Secondary | ICD-10-CM | POA: Diagnosis not present

## 2024-01-02 LAB — BASIC METABOLIC PANEL WITH GFR
Anion gap: 10 (ref 5–15)
Anion gap: 6 (ref 5–15)
Anion gap: 8 (ref 5–15)
Anion gap: 9 (ref 5–15)
BUN: 15 mg/dL (ref 8–23)
BUN: 16 mg/dL (ref 8–23)
BUN: 17 mg/dL (ref 8–23)
BUN: 19 mg/dL (ref 8–23)
CO2: 13 mmol/L — ABNORMAL LOW (ref 22–32)
CO2: 14 mmol/L — ABNORMAL LOW (ref 22–32)
CO2: 14 mmol/L — ABNORMAL LOW (ref 22–32)
CO2: 16 mmol/L — ABNORMAL LOW (ref 22–32)
Calcium: 8.7 mg/dL — ABNORMAL LOW (ref 8.9–10.3)
Calcium: 9 mg/dL (ref 8.9–10.3)
Calcium: 9.1 mg/dL (ref 8.9–10.3)
Calcium: 9.2 mg/dL (ref 8.9–10.3)
Chloride: 102 mmol/L (ref 98–111)
Chloride: 102 mmol/L (ref 98–111)
Chloride: 104 mmol/L (ref 98–111)
Chloride: 106 mmol/L (ref 98–111)
Creatinine, Ser: 0.96 mg/dL (ref 0.44–1.00)
Creatinine, Ser: 1.05 mg/dL — ABNORMAL HIGH (ref 0.44–1.00)
Creatinine, Ser: 1.1 mg/dL — ABNORMAL HIGH (ref 0.44–1.00)
Creatinine, Ser: 1.2 mg/dL — ABNORMAL HIGH (ref 0.44–1.00)
GFR, Estimated: 50 mL/min — ABNORMAL LOW (ref >60)
GFR, Estimated: 56 mL/min — ABNORMAL LOW (ref >60)
GFR, Estimated: 59 mL/min — ABNORMAL LOW (ref >60)
GFR, Estimated: >60 mL/min (ref >60)
Glucose, Bld: 108 mg/dL — ABNORMAL HIGH (ref 70–99)
Glucose, Bld: 110 mg/dL — ABNORMAL HIGH (ref 70–99)
Glucose, Bld: 111 mg/dL — ABNORMAL HIGH (ref 70–99)
Glucose, Bld: 114 mg/dL — ABNORMAL HIGH (ref 70–99)
Potassium: 3.7 mmol/L (ref 3.5–5.1)
Potassium: 3.8 mmol/L (ref 3.5–5.1)
Potassium: 4.1 mmol/L (ref 3.5–5.1)
Potassium: 4.1 mmol/L (ref 3.5–5.1)
Sodium: 125 mmol/L — ABNORMAL LOW (ref 135–145)
Sodium: 125 mmol/L — ABNORMAL LOW (ref 135–145)
Sodium: 126 mmol/L — ABNORMAL LOW (ref 135–145)
Sodium: 128 mmol/L — ABNORMAL LOW (ref 135–145)

## 2024-01-02 LAB — NA AND K (SODIUM & POTASSIUM), RAND UR
Potassium Urine: 10 mmol/L
Sodium, Ur: 43 mmol/L

## 2024-01-02 LAB — CBC
HCT: 22.7 % — ABNORMAL LOW (ref 36.0–46.0)
Hemoglobin: 7.8 g/dL — ABNORMAL LOW (ref 12.0–15.0)
MCH: 30.2 pg (ref 26.0–34.0)
MCHC: 34.4 g/dL (ref 30.0–36.0)
MCV: 88 fL (ref 80.0–100.0)
Platelets: 115 10*3/uL — ABNORMAL LOW (ref 150–400)
RBC: 2.58 MIL/uL — ABNORMAL LOW (ref 3.87–5.11)
RDW: 12.7 % (ref 11.5–15.5)
WBC: 4.8 10*3/uL (ref 4.0–10.5)
nRBC: 0 % (ref 0.0–0.2)

## 2024-01-02 LAB — SODIUM, URINE, RANDOM: Sodium, Ur: 23 mmol/L

## 2024-01-02 LAB — HIV ANTIBODY (ROUTINE TESTING W REFLEX): HIV Screen 4th Generation wRfx: NONREACTIVE

## 2024-01-02 LAB — CHLORIDE, URINE, RANDOM: Chloride Urine: 44 mmol/L

## 2024-01-02 MED ORDER — LACTATED RINGERS IV BOLUS
500.0000 mL | Freq: Once | INTRAVENOUS | Status: AC
  Administered 2024-01-02: 500 mL via INTRAVENOUS

## 2024-01-02 MED ORDER — LACTATED RINGERS IV SOLN: INTRAVENOUS | Status: AC

## 2024-01-02 NOTE — Progress Notes (Signed)
 Alert and oriented x 4, resp even non labored, denies pain at this time, IVF infusing @ 75 cc/hr, STAT labs collected by phlebotomist, call bell within reach, all safety measures in place.

## 2024-01-02 NOTE — Care Management Obs Status (Signed)
 MEDICARE OBSERVATION STATUS NOTIFICATION   Patient Details  Name: Jenna May MRN: [id number] Date of Birth: 03-12-1956   Medicare Observation Status Notification Given:  Yes    Omie Bickers, RN 01/02/2024, 2:32 PM

## 2024-01-03 ENCOUNTER — Other Ambulatory Visit: Payer: Self-pay | Admitting: Internal Medicine

## 2024-01-03 DIAGNOSIS — E871 Hypo-osmolality and hyponatremia: Secondary | ICD-10-CM | POA: Diagnosis not present

## 2024-01-03 DIAGNOSIS — E872 Acidosis, unspecified: Secondary | ICD-10-CM

## 2024-01-03 DIAGNOSIS — I10 Essential (primary) hypertension: Secondary | ICD-10-CM

## 2024-01-03 LAB — CBC
HCT: 22.4 % — ABNORMAL LOW (ref 36.0–46.0)
Hemoglobin: 7.3 g/dL — ABNORMAL LOW (ref 12.0–15.0)
MCH: 29.6 pg (ref 26.0–34.0)
MCHC: 32.6 g/dL (ref 30.0–36.0)
MCV: 90.7 fL (ref 80.0–100.0)
Platelets: 101 10*3/uL — ABNORMAL LOW (ref 150–400)
RBC: 2.47 MIL/uL — ABNORMAL LOW (ref 3.87–5.11)
RDW: 13.2 % (ref 11.5–15.5)
WBC: 5.2 10*3/uL (ref 4.0–10.5)
nRBC: 0.4 % — ABNORMAL HIGH (ref 0.0–0.2)

## 2024-01-03 LAB — BASIC METABOLIC PANEL WITH GFR
Anion gap: 7 (ref 5–15)
Anion gap: 8 (ref 5–15)
Anion gap: 7 (ref 5–15)
BUN: 15 mg/dL (ref 8–23)
BUN: 13 mg/dL (ref 8–23)
BUN: 12 mg/dL (ref 8–23)
CO2: 15 mmol/L — ABNORMAL LOW (ref 22–32)
CO2: 14 mmol/L — ABNORMAL LOW (ref 22–32)
CO2: 16 mmol/L — ABNORMAL LOW (ref 22–32)
Calcium: 9.1 mg/dL (ref 8.9–10.3)
Calcium: 9.3 mg/dL (ref 8.9–10.3)
Calcium: 9 mg/dL (ref 8.9–10.3)
Chloride: 107 mmol/L (ref 98–111)
Chloride: 108 mmol/L (ref 98–111)
Chloride: 104 mmol/L (ref 98–111)
Creatinine, Ser: 1.03 mg/dL — ABNORMAL HIGH (ref 0.44–1.00)
Creatinine, Ser: 0.95 mg/dL (ref 0.44–1.00)
Creatinine, Ser: 0.93 mg/dL (ref 0.44–1.00)
GFR, Estimated: >60 mL/min (ref >60)
GFR, Estimated: >60 mL/min (ref >60)
GFR, Estimated: >60 mL/min (ref >60)
Glucose, Bld: 104 mg/dL — ABNORMAL HIGH (ref 70–99)
Glucose, Bld: 108 mg/dL — ABNORMAL HIGH (ref 70–99)
Glucose, Bld: 117 mg/dL — ABNORMAL HIGH (ref 70–99)
Potassium: 4.1 mmol/L (ref 3.5–5.1)
Potassium: 4.1 mmol/L (ref 3.5–5.1)
Potassium: 4.1 mmol/L (ref 3.5–5.1)
Sodium: 129 mmol/L — ABNORMAL LOW (ref 135–145)
Sodium: 130 mmol/L — ABNORMAL LOW (ref 135–145)
Sodium: 127 mmol/L — ABNORMAL LOW (ref 135–145)

## 2024-01-03 LAB — IRON,TIBC AND FERRITIN PANEL
Ferritin: 716 ng/mL — ABNORMAL HIGH (ref 15–150)
Iron Saturation: 53 % (ref 15–55)
Iron: 119 ug/dL (ref 27–139)
Total Iron Binding Capacity: 226 ug/dL — ABNORMAL LOW (ref 250–450)
UIBC: 107 ug/dL — ABNORMAL LOW (ref 118–369)

## 2024-01-03 LAB — MAGNESIUM: Magnesium: 1.6 mg/dL — ABNORMAL LOW (ref 1.7–2.4)

## 2024-01-03 MED ORDER — LORATADINE 10 MG PO TABS
10.0000 mg | ORAL_TABLET | Freq: Every day | ORAL | Status: DC
  Filled 2024-01-03: qty 1

## 2024-01-03 MED ORDER — LACTATED RINGERS IV BOLUS
500.0000 mL | Freq: Once | INTRAVENOUS | Status: AC
  Administered 2024-01-03: 500 mL via INTRAVENOUS

## 2024-01-03 NOTE — Telephone Encounter (Signed)
 Medication sent to pharmacy

## 2024-01-03 NOTE — TOC CM/SW Note (Signed)
 Transition of Care [facility]) - Inpatient Brief Assessment   Patient Details  Name: Jenna May MRN: [id number] Date of Birth: 02-22-1956  Transition of Care [facility]) CM/SW Contact:    Juliane Och, LCSW Phone Number: 01/03/2024, 8:40 AM   Clinical Narrative:  8:40 AM Per chart review, patient resides at home alone. Patient has a PCP and insurance. Patient does not have SNF/HH/DME history. Patient's preferred pharmacy's are [facility] [address] ([place]), [facility], [facility]. No TOC needs were identified at this time. TOC will continue to follow and be available to assist.  Transition of Care Asessment: Insurance and Status: Insurance coverage has been reviewed Patient has primary care physician: Yes Home environment has been reviewed: Private Residence Prior level of function:: N/A Prior/Current Home Services: No current home services Social Drivers of Health Review: SDOH reviewed no interventions necessary Readmission risk has been reviewed: Yes Transition of care needs: no transition of care needs at this time

## 2024-01-03 NOTE — Plan of Care (Signed)

## 2024-01-03 NOTE — Discharge Summary (Addendum)
Name: Jenna May MRN: [id number] DOB: 1956-06-22 68 y.o. PCP: May, Jenna N, DO  Date of Admission: 01/01/2024  6:19 AM Date of Discharge:  01/03/2024  Attending Physician: Dr. Ancil May   DISCHARGE DIAGNOSIS:  Primary Problem: Hyponatremia   Hospital Problems: Principal Problem:   Hyponatremia    DISCHARGE MEDICATIONS:   Allergies as of 01/03/2024       Reactions   Amlodipine  Other (See Comments)   Gingival hyperplasia   Hydrochlorothiazide  Other (See Comments)   Patient has gout         Medication List     PAUSE taking these medications    spironolactone  100 MG tablet Wait to take this until your doctor or other care provider tells you to start again. Commonly known as: Aldactone  Take 1 tablet (100 mg total) by mouth daily.       TAKE these medications    carvedilol  12.5 MG tablet Commonly known as: COREG  Take 1 tablet (12.5 mg total) by mouth 2 (two) times daily.   fish oil-omega-3 fatty acids  1000 MG capsule Take 2 capsules (2 g total) by mouth daily.   Muscle Rub 10-15 % Crea Apply 1 application topically as needed for muscle pain.   olmesartan  40 MG tablet Commonly known as: BENICAR  Take 1 tablet by mouth once daily        DISPOSITION AND FOLLOW-UP:  Jenna May was discharged from [facility] in Good condition. At the hospital follow up visit please address:  Hyponatremia/NAGMA:  Please recheck the following Labs: urine Na, Cl, K to calculate urine anion gap. If it is leaning toward RTA; recommend OP nephrology referral for RTA work up  We do not have clear cause for her hyponatremia, likely hypovolemic vs low solute intake as she responded well to fluids. Recheck urine Na and osmolality.  Patient was counseled on free water restriction, decrease alcohol intake and encouraged to eat regular meals. HTN: Prior to hospitalization, aldactone  was held. We held it during hospitalization and at discharge. Normocytic  anemia: Hemoglobin stable, FOBT negative.  Last colonoscopy in 2010. Recommend outpatient colonoscopy referral. Follow-up on outpatient iron/TIBC/ferritin labs. Elevated liver enzymes: Outpatient right upper quadrant ultrasound 09/2023 negative for cirrhosis.  Per chart review she did have elevated GGT several years ago.  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 Recommend outpatient ultrasound elastography and referral to GI.  Follow-up Recommendations: Consults: Nephrology and GI Labs: Basic Metabolic Profile and CBC, urine Na/K/Cl/osmolality Studies: Colonoscopy Medications:  Held: Spironolactone  No other changes to medications.  Follow-up Appointments:  Follow-up Information     Jenna Gavia, MD. Go on 01/10/2024.   Specialty: Internal Medicine Why: At 10:15 PM. Contact information: [address] [place] [address] [phone]                 HOSPITAL COURSE:  Jenna May is a 68 y.o. person living with a history of hyponatremia, normocytic anemia, hypertension, elevated liver enzymes who presents to ED today after being told by her PCP to come to ED to get evaluated for hyponatremia, admitted for further work up.    Asymptomatic hyponatremia   Presented with sodium 118 (117 - checked at PCP), serum Osm 263. Glucose 98. No reported GI losses. No urinary symptoms. Drinks alcohol daily, reports about 1-2 glass. Endorses good appetite. She is not on any medications leading to hyponatremia. No hx of chf. Unclear etiology at this time, Urine Na 23 and osm 168 s/p  1 L NS, not a good indicator. Trig 149, WNL. TSH mildly elevated to 5.203, consistent with prior subclinical hypothyroidism. UA with findings negative for infection  Less likely hypovolemic wo evidence of GI losses or reported decrease in po intake. She does have hx of liver fibrosis though Albumin 3.5 (WNL). Euvolemic on exam. Patient received IVF with  improvement in Na to 130. Asymptomatic. Repeat Na 127 afternoon. Given 500 cc LR bolus.  Counseled on decreasing free water intake and alcohol intake.  She is advised to limit free water to 1200 mL in 24 hours.  She is advised to limit alcohol intake.   NAGMA Presented with bicarb 16, AG 10, indicating NAGMA. Etiology to consider can be from of loss of bicarb or RTA. Denies any GI losses. Potassium WNL. Bicarb 13 (14) 2/2 to infusion of large volume normal saline infusion. However she presented with low bicarb 16, urine anion gap 9, suggesting less likely GI losses, leaning more towards RTA. Unlikely type 4, K WNL. Though the urine studies were taken after IVF treatment. Will plan on repeat labs OP; if still low and referral to nephrology.    Normocytic anemia FOBT negative per ED provider. Denies any overt bleed. Last colonoscopy 2010 with normal findings, due for repeat. She had outpatient Iron, TIBC, and ferritin labs collected. Results pending. Otherwise, asymptomatic. Plan for OP colonoscopy referral and follow-up on iron studies.   Hypertension Home medication consist of Coreg  12.5 mg twice daily and olmesartan  40 mg daily. Reports that she has not taken spironolactone  since her OV with PCP on 6/4.  We continue home medication Coreg  12.5 mg twice daily and started her on formulary version irbesartan 300 mg daily.  Continue to hold spironolactone .   Elevated liver enzymes Has a prior history of transaminitis with concerns for cirrhosis. RUQ US  09/2023 negative for cirrhosis. Per chart review, she had elevated GGT  She denies any abdominal pain.  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 Denies any withdrawal symptoms or previous history of seizures. - CIWA without Ativan score 0   DISCHARGE INSTRUCTIONS:   Discharge Instructions     Diet -  low sodium heart healthy   Complete by: As directed    Discharge instructions   Complete by: As directed    Jenna May,  You came to the hospital for low sodium.   To help your low sodium there are 2 things you can do. 1. Limit your total fluid intake to 1200 mL in 1 day. Think of it like drinking two 500 mL water bottles in 24 hour.  2. Eat 3 meals a day  Do not drink extra water.  -Please do not take spironolactone  medication until you are seen by your primary care doctor.  Otherwise you can continue taking your home medication Coreg  2 times a day and olmesartan  40 mg daily.  If you have any of these following symptoms, please call us  or seek care at an emergency department: -Chest Pain -Difficulty Breathing -Worsening abdominal pain -Syncope (passing out) -Drooping of face -Slurred speech -Sudden weakness in your leg or arm -Fever -Chills -blood in the stool -dark black, sticky stool  If you have any questions or concerns, call our clinic at [phone], [phone] or after hours call [phone] and ask for the internal medicine resident on call.  I  am glad you are feeling better. It was a pleasure taking care for you. I wish a good recovery and good health!   Dr. Lanney Pitts   Increase activity slowly   Complete by: As directed        SUBJECTIVE:   Patient is evaluated bedside, she expressed her concerns about repeated labs.  Reports that she was frustrated and she does not want to get any more labs and sticks.  Reports that she wanted to go home, states that she has not slept well for the past couple of nights here.  Reports that the nurse told her that the water is just flushing out the sodium out of her system and told her to drink a lot of Gatorade, coconut water and some kind of a tea.  Otherwise she denies any symptoms of dizzy spells or lightheadedness, nausea or vomiting or diarrhea.  Denied any abdominal pain.  States that she ate her meals  yesterday and this morning.  Patient was reassured at bedside, that we are waiting on 1 final repeat of sodium this afternoon and if testing is consistent with her morning sodium, we will plan on discharge later in the afternoon with follow-up that is scheduled on Wednesday for repeat labs.  Patient was reassured that we acknowledged her concerns and it is difficult to stay in the hospital and getting multiple sticks for blood draw.  Patient was evaluated in the afternoon, after repeat sodium 127, she was given 500 cc bolus of LR.  Patient was counseled on decreasing free water intake, alcohol use, and eating 3 meals a day.   Discharge Vitals:   BP 138/67 (BP Location: Right Arm)   Pulse 70   Temp 98.8 F (37.1 C)   Resp 17   Ht 5\' 6"  (1.676 m)   Wt 68.9 kg   LMP 09/29/1995   SpO2 100%   BMI 24.53 kg/m   OBJECTIVE:  Physical Exam   General: Sitting beside bed, appears well, no acute distress Cardiovascular: Regular rate Pulmonary: Breathing comfortably Abdomen: Soft, nontender Neuro: Awake, alert, ambulating well, no focal deficits, answering questions appropriately  Pertinent Labs, Studies, and Procedures:     Latest Ref Rng & Units 01/03/2024    5:55 AM 01/02/2024   12:21 AM 01/01/2024    6:41 AM  CBC  WBC 4.0 - 10.5 K/uL 5.2  4.8  4.6   Hemoglobin 12.0 - 15.0 g/dL 7.3  7.8  8.3   Hematocrit 36.0 - 46.0 % 22.4  22.7  25.0   Platelets 150 - 400 K/uL 101  115  134        Latest Ref Rng & Units 01/03/2024   11:37 AM 01/03/2024    5:55 AM 01/03/2024   12:48 AM  CMP  Glucose 70 - 99 mg/dL 161  096  045   BUN 8 - 23 mg/dL 12  13  15    Creatinine 0.44 - 1.00 mg/dL 4.09  8.11  9.14   Sodium 135 - 145 mmol/L 127  130  129   Potassium 3.5 - 5.1 mmol/L 4.1  4.1  4.1   Chloride 98 - 111 mmol/L 104  108  107   CO2 22 - 32 mmol/L 16  14  15    Calcium 8.9 - 10.3 mg/dL 9.0  9.3  9.1     No results found.   Signed: Lanney Pitts, D.O.  Internal Medicine Resident, PGY-1 [facility]  Pager: [phone] 1:49 PM,  01/03/2024      

## 2024-01-03 NOTE — Discharge Instructions (Addendum)
 Ms. Pelly,  You came to the hospital for low sodium.   To help your low sodium there are 2 things you can do. Limit your total fluid intake to 1200 mL in 1 day. Think of it like drinking two 500 mL water bottles in 24 hour.  Eat 3 meals a day  Do not drink extra water.  -Please do not take spironolactone  medication until you are seen by your primary care doctor.  Otherwise you can continue taking your home medication Coreg  2 times a day and olmesartan  40 mg daily.  If you have any of these following symptoms, please call us  or seek care at an emergency department: -Chest Pain -Difficulty Breathing -Worsening abdominal pain -Syncope (passing out) -Drooping of face -Slurred speech -Sudden weakness in your leg or arm -Fever -Chills -blood in the stool -dark black, sticky stool  If you have any questions or concerns, call our clinic at [phone], [phone] or after hours call [phone] and ask for the internal medicine resident on call.  I am glad you are feeling better. It was a pleasure taking care for you. I wish a good recovery and good health!   Dr. Lanney Pitts

## 2024-01-04 ENCOUNTER — Telehealth: Payer: Self-pay

## 2024-01-04 LAB — UREA NITROGEN, URINE: Urea Nitrogen, Ur: 445 mg/dL

## 2024-01-04 NOTE — Transitions of Care (Post Inpatient/ED Visit) (Unsigned)
   01/04/2024  Name: Jenna May MRN: [id number] DOB: April 14, 1956  Today's TOC FU Call Status: Today's TOC FU Call Status:: Unsuccessful Call (1st Attempt) Unsuccessful Call (1st Attempt) Date: 01/04/24  Attempted to reach the patient regarding the most recent Inpatient/ED visit.  Follow Up Plan: Additional outreach attempts will be made to reach the patient to complete the Transitions of Care (Post Inpatient/ED visit) call.   Signature Darrall Ellison, LPN [facility] Nurse Health Advisor Direct Dial [phone]

## 2024-01-05 ENCOUNTER — Encounter: Admitting: Student

## 2024-01-06 NOTE — Transitions of Care (Post Inpatient/ED Visit) (Signed)
   01/06/2024  Name: Jenna May MRN: [id number] DOB: [birth year]  Today's TOC FU Call Status: Today's TOC FU Call Status:: Successful TOC FU Call Completed Unsuccessful Call (1st Attempt) Date: 01/04/24 Unsuccessful Call (2nd Attempt) Date: 01/06/24 [name] FU Call Complete Date: 01/06/24 Patient's Name and Date of Birth confirmed.  Transition Care Management Follow-up Telephone Call Date of Discharge: 01/03/24 Discharge Facility: [name]) Type of Discharge: Inpatient Admission Primary Inpatient Discharge Diagnosis:: hyperglycemia How have you been since you were released from the hospital?: Better Any questions or concerns?: No  Items Reviewed: Did you receive and understand the discharge instructions provided?: Yes Medications obtained,verified, and reconciled?: Yes (Medications Reviewed) Any new allergies since your discharge?: No Dietary orders reviewed?: Yes Do you have support at home?: No  Medications Reviewed Today: Medications Reviewed Today     Reviewed by Darrall Ellison, LPN (Licensed Practical Nurse) on 01/06/24 at 1428  Med List Status: <None>   Medication Order Taking? Sig Documenting Provider Last Dose Status Informant  carvedilol  (COREG ) 12.5 MG tablet [id number] Yes Take 1 tablet (12.5 mg total) by mouth 2 (two) times daily. Atway, Rayann N, DO  Active Self, Pharmacy Records  fish oil-omega-3 fatty acids  1000 MG capsule [id number] Yes Take 2 capsules (2 g total) by mouth daily. Kalia-Reynolds, Maitri S, DO  Active Self, Pharmacy Records  Menthol-Methyl Salicylate (MUSCLE RUB) 10-15 % CREA [id number] Yes Apply 1 application topically as needed for muscle pain. [provider]  Active Self, Pharmacy Records  olmesartan  (BENICAR ) 40 MG tablet [id number] Yes Take 1 tablet by mouth once daily Atway, Rayann N, DO  Active   spironolactone  (ALDACTONE ) 100 MG tablet [id number]  Take 1 tablet (100 mg total) by mouth daily.  Patient not taking: Reported on  01/06/2024   Vicente Graham, DO  Active Self, Pharmacy Records            Home Care and Equipment/Supplies: Were Home Health Services Ordered?: NA Any new equipment or medical supplies ordered?: NA  Functional Questionnaire: Do you need assistance with bathing/showering or dressing?: No Do you need assistance with meal preparation?: No Do you need assistance with eating?: No Do you have difficulty maintaining continence: No Do you need assistance with getting out of bed/getting out of a chair/moving?: No Do you have difficulty managing or taking your medications?: No  Follow up appointments reviewed: PCP Follow-up appointment confirmed?: Yes Date of PCP follow-up appointment?: 01/10/24 Follow[facility] Provider[facility] Follow-up appointment confirmed?: NA Do you need transportation to your follow-up appointment?: No Do you understand care options if your condition(s) worsen?: Yes-patient verbalized understanding    SIGNATURE Darrall Ellison, LPN [facility] Nurse Health Advisor Direct Dial [phone]

## 2024-01-06 NOTE — Transitions of Care (Post Inpatient/ED Visit) (Signed)
   01/06/2024  Name: Shariah Assad MRN: [id number] DOB: 1956-07-15  Today's TOC FU Call Status: Today's TOC FU Call Status:: Unsuccessful Call (2nd Attempt) Unsuccessful Call (1st Attempt) Date: 01/04/24 Unsuccessful Call (2nd Attempt) Date: 01/06/24  Attempted to reach the patient regarding the most recent Inpatient/ED visit.  Follow Up Plan: Additional outreach attempts will be made to reach the patient to complete the Transitions of Care (Post Inpatient/ED visit) call.   Signature Darrall Ellison, LPN [facility] Nurse Health Advisor Direct Dial [phone]

## 2024-01-10 ENCOUNTER — Ambulatory Visit (INDEPENDENT_AMBULATORY_CARE_PROVIDER_SITE_OTHER): Admitting: Student

## 2024-01-10 ENCOUNTER — Encounter: Payer: Self-pay | Admitting: Student

## 2024-01-10 VITALS — BP 147/68 | HR 49 | Temp 98.2°F | Ht 66.0 in | Wt 164.6 lb

## 2024-01-10 DIAGNOSIS — D649 Anemia, unspecified: Secondary | ICD-10-CM

## 2024-01-10 DIAGNOSIS — Z1211 Encounter for screening for malignant neoplasm of colon: Secondary | ICD-10-CM

## 2024-01-10 DIAGNOSIS — E871 Hypo-osmolality and hyponatremia: Secondary | ICD-10-CM | POA: Diagnosis not present

## 2024-01-10 DIAGNOSIS — Z0000 Encounter for general adult medical examination without abnormal findings: Secondary | ICD-10-CM

## 2024-01-10 DIAGNOSIS — R7989 Other specified abnormal findings of blood chemistry: Secondary | ICD-10-CM

## 2024-01-10 DIAGNOSIS — R748 Abnormal levels of other serum enzymes: Secondary | ICD-10-CM

## 2024-01-10 DIAGNOSIS — I10 Essential (primary) hypertension: Secondary | ICD-10-CM

## 2024-01-10 DIAGNOSIS — K7400 Hepatic fibrosis, unspecified: Secondary | ICD-10-CM

## 2024-01-10 MED ORDER — CARVEDILOL 6.25 MG PO TABS: 6.2500 mg | ORAL_TABLET | Freq: Two times a day (BID) | ORAL | 11 refills | Status: DC

## 2024-01-10 NOTE — Assessment & Plan Note (Addendum)
 She has a known history of transaminitis with concerns for hepatic fibrosis on ultrasound.  Fib 4 was elevated in the hospital, so they recommended a referral to gastroenterology for possible outpatient elastography.  I have sent in this referral.  We will check a CMP today.

## 2024-01-10 NOTE — Assessment & Plan Note (Signed)
 Recently admitted to the hospital and worked up for hyponatremia.  Thought to be possibly related to spironolactone  use, so that was held.  RTA workup was done while inpatient, but they recommended repeating it outpatient.  Alcohol intake and/or poor oral intake were possible contributors, although her reported alcohol intake is low and she states she is eating and drinking well.  She says that she feels perfectly normal since leaving the hospital and denies any lightheadedness, dizziness, nausea or vomiting.   We will recheck labs today per the inpatient team including a complete metabolic panel, CBC, and urine electrolytes and osmolality.  Will continue to hold the spironolactone  for now.

## 2024-01-10 NOTE — Assessment & Plan Note (Signed)
 BP Readings from Last 3 Encounters:  01/10/24 (!) 147/68  01/03/24 138/67  12/27/23 (!) 161/79   Blood pressure is elevated in the office today, however she says she takes her blood pressure almost every day and it is usually normal (120s over 80s or 90s).  She is asymptomatic.  She takes olmesartan  40 mg daily and carvedilol  12.5 mg twice daily.  Her heart rate is in the 40s and 50s today, so I will decrease her carvedilol  to 6.25 mg twice a day.  We will continue her olmesartan  dose as prescribed.  Will recheck her blood pressure and heart rate at her next visit.

## 2024-01-10 NOTE — Progress Notes (Signed)
 CC: Hospital follow up  HPI:  [name] is a 68 y.o. female living with a history stated below and presents today for the chief complaint stated above. Please see problem based assessment and plan for additional details.  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 Anemia panel in 03/2010 showing Iron 53 , TIBC 303 , iron saturation percentage 17, ferritin 197 , B12 and folate within normal limits.   Paronychia of toe, right 11/03/2018   Postmenopausal status    Subjective vision disturbance, left eye 03/17/2018   Tobacco abuse    Quit 2009, after approximately 15 pack year smoking history.   Umbilical hernia    S/P repair approximately 1995   Urine abnormality 05/13/2017    Current Outpatient Medications on File Prior to Visit  Medication Sig Dispense Refill   fish oil-omega-3 fatty acids  1000 MG capsule Take 2 capsules (2 g total) by mouth daily. 60 capsule 5   Menthol-Methyl Salicylate (MUSCLE RUB) 10-15 % CREA Apply 1 application topically as needed for muscle pain.     olmesartan  (BENICAR ) 40 MG tablet Take 1 tablet by mouth once daily 30 tablet 0   [Paused] spironolactone  (ALDACTONE ) 100 MG tablet Take 1 tablet (100 mg total) by mouth daily. (Patient not taking: Reported on 01/06/2024) 30 tablet 11   No current facility-administered medications on file prior to visit.    Family History  Problem Relation Age of Onset   Hypertension Mother    Diabetes Father    Hypertension Father    Hypertension Sister    Heart attack Brother 49   Breast cancer Neg Hx      Social History   Socioeconomic History   Marital status: Single    Spouse name: Not on file   Number of children: 0   Years of education: 12th grade   Highest education level: High school graduate  Occupational History   Occupation: Copywriter, advertising: UNEMPLOYED  Tobacco Use   Smoking status: Former    Current packs/day: 0.00    Average packs/day: 0.4 packs/day for 30.0 years (12.0 ttl pk-yrs)    Types: Cigarettes    Start date: 04/11/1978    Quit date: 04/11/2008    Years since quitting: 15.7   Smokeless tobacco: Never  Vaping Use   Vaping status: Never Used  Substance and Sexual Activity   Alcohol use: Yes    Alcohol/week: 0.0 standard drinks of alcohol    Comment: occassional glass of wine   Drug use: No   Sexual activity: Not Currently    Birth control/protection: Surgical  Other Topics Concern   Not on file  Social History Narrative   Unemployed. Intermittently worked, Engineer, mining, Copy.    Graduated from high school, [employer].   Single.   No children.   Orange card.   Social Drivers of Corporate investment banker Strain: Low Risk  (04/29/2023)   Overall Financial Resource Strain (CARDIA)    Difficulty of Paying Living  Expenses: Not hard at all  Food Insecurity: No Food Insecurity (01/01/2024)   Hunger Vital Sign    Worried About Running Out of Food in the Last Year: Never true    Ran Out of Food in the Last Year: Never true  Transportation Needs: No Transportation Needs (01/01/2024)   PRAPARE - Administrator, Civil Service (Medical): No    Lack of Transportation (Non-Medical): No  Physical Activity: Sufficiently Active (04/29/2023)   Exercise Vital Sign    Days of Exercise per Week: 7 days    Minutes of Exercise per Session: 30 min  Stress: No Stress Concern Present (04/29/2023)   [employer] of Occupational Health - Occupational Stress Questionnaire    Feeling of Stress : Not at all  Social Connections:  Moderately Integrated (01/01/2024)   Social Connection and Isolation Panel    Frequency of Communication with Friends and Family: More than three times a week    Frequency of Social Gatherings with Friends and Family: More than three times a week    Attends Religious Services: More than 4 times per year    Active Member of [employer] or Organizations: No    Attends Engineer, structural: More than 4 times per year    Marital Status: Never married  Intimate Partner Violence: Not At Risk (01/01/2024)   Humiliation, Afraid, Rape, and Kick questionnaire    Fear of Current or Ex-Partner: No    Emotionally Abused: No    Physically Abused: No    Sexually Abused: No   Review of Systems: ROS negative except for what is noted on the assessment and plan.  Vitals:   01/10/24 1009 01/10/24 1028  BP: (!) 165/65 (!) 147/68  Pulse: (!) 54 (!) 49  Temp: 98.2 F (36.8 C)   TempSrc: Oral   SpO2: 100%   Weight: 164 lb 9.6 oz (74.7 kg)   Height: 5' 6 (1.676 m)    Physical Exam: Well-appearing older female, sitting in chair in no acute distress Heart rate and rhythm regular, systolic murmur appreciated Lungs clear to auscultation bilaterally No focal neurological deficits No lesions or rashes on skin  Assessment & Plan:   Patient discussed with Dr. Lanetta Pion  Hyponatremia Recently admitted to the hospital and worked up for hyponatremia.  Thought to be possibly related to spironolactone  use, so that was held.  RTA workup was done while inpatient, but they recommended repeating it outpatient.  Alcohol intake and/or poor oral intake were possible contributors, although her reported alcohol intake is low and she states she is eating and drinking well.  She says that she feels perfectly normal since leaving the hospital and denies any lightheadedness, dizziness, nausea or vomiting.   We will recheck labs today per the inpatient team including a complete metabolic panel, CBC, and urine electrolytes  and osmolality.  Will continue to hold the spironolactone  for now.  Essential hypertension BP Readings from Last 3 Encounters:  01/10/24 (!) 147/68  01/03/24 138/67  12/27/23 (!) 161/79   Blood pressure is elevated in the office today, however she says she takes her blood pressure almost every day and it is usually normal (120s over 80s or 90s).  She is asymptomatic.  She takes olmesartan  40 mg daily and carvedilol  12.5 mg twice daily.  Her heart rate is in the 40s and 50s today, so I will decrease her carvedilol  to 6.25 mg twice a day.  We will continue her olmesartan  dose as prescribed.  Will recheck  her blood pressure and heart rate at her next visit.  Elevated liver enzymes She has a known history of transaminitis with concerns for hepatic fibrosis on ultrasound.  Fib 4 was elevated in the hospital, so they recommended a referral to gastroenterology for possible outpatient elastography.  I have sent in this referral.  We will check a CMP today.  Preventative health care Placed a referral for colonoscopy.  Dorthy Gavia, MD [facility] Internal Medicine, PGY-1 Phone: [phone] Date 01/10/2024 Time 11:32 AM

## 2024-01-10 NOTE — Assessment & Plan Note (Signed)
 Placed a referral for colonoscopy.

## 2024-01-10 NOTE — Assessment & Plan Note (Signed)
>>  ASSESSMENT AND PLAN FOR ELEVATED LIVER ENZYMES WRITTEN ON 01/10/2024 11:31 AM BY JUSTUS, MICHAEL, MD  She has a known history of transaminitis with concerns for hepatic fibrosis on ultrasound.  Fib 4 was elevated in the hospital, so they recommended a referral to gastroenterology for possible outpatient elastography.  I have sent in this referral.  We will check a CMP today.

## 2024-01-10 NOTE — Progress Notes (Signed)
 Internal Medicine Clinic Attending  Case discussed with the resident at the time of the visit.  We reviewed the resident's history and exam and pertinent patient test results.  I agree with the assessment, diagnosis, and plan of care documented in the resident's note.

## 2024-01-10 NOTE — Patient Instructions (Signed)
 Thank you, [name] for allowing us  to provide your care today. Today we discussed your heart rate and recent hospitalization.  Since your heart rate is low today, I have decreased your carvedilol  (Coreg ) dose to 6.25 mg twice daily.  I have sent this to your pharmacy for pickup.  I have also placed a referral to gastroenterology, who will call you to schedule an appointment about your liver.  Please keep checking your blood pressure at home and bring your log to your next visit.  I have ordered the following labs for you:  Lab Orders         CBC no Diff         Sodium, Urine         Potassium, Urine         Chloride, urine, random         Osmolality, Urine         CMP14 + Anion Gap      I will call you as soon as I have the results.   Referrals ordered today:   Referral Orders         Amb Referral to Colonoscopy         Ambulatory referral to Gastroenterology      I have ordered the following medication/changed the following medications:   Stop the following medications: Medications Discontinued During This Encounter  Medication Reason   carvedilol  (COREG ) 12.5 MG tablet Dose change    Start the following medications: Meds ordered this encounter  Medications   carvedilol  (COREG ) 6.25 MG tablet    Sig: Take 1 tablet (6.25 mg total) by mouth 2 (two) times daily.    Dispense:  60 tablet    Refill:  11     Follow up: 3 months   We look forward to seeing you next time. Please call our clinic at [phone] if you have any questions or concerns. The best time to call is Monday-Friday from 9am-4pm, but there is someone available 24/7. If after hours or the weekend, call the main hospital number and ask for the Internal Medicine Resident On-Call. If you need medication refills, please notify your pharmacy one week in advance and they will send us  a request.   Thank you for trusting me with your care. Wishing you the best!   Jenna Gavia, MD [facility]

## 2024-01-11 ENCOUNTER — Other Ambulatory Visit: Payer: Self-pay | Admitting: Student

## 2024-01-11 LAB — CMP14 + ANION GAP
ALT: 49 IU/L — ABNORMAL HIGH (ref 0–32)
AST: 33 IU/L (ref 0–40)
Albumin: 3.7 g/dL — ABNORMAL LOW (ref 3.9–4.9)
Alkaline Phosphatase: 114 IU/L (ref 44–121)
Anion Gap: 12 mmol/L (ref 10.0–18.0)
BUN/Creatinine Ratio: 20 (ref 12–28)
BUN: 16 mg/dL (ref 8–27)
Bilirubin Total: <0.2 mg/dL (ref 0.0–1.2)
CO2: 15 mmol/L — ABNORMAL LOW (ref 20–29)
Calcium: 9.2 mg/dL (ref 8.7–10.3)
Chloride: 94 mmol/L — ABNORMAL LOW (ref 96–106)
Creatinine, Ser: 0.81 mg/dL (ref 0.57–1.00)
Globulin, Total: 3.3 g/dL (ref 1.5–4.5)
Glucose: 83 mg/dL (ref 70–99)
Potassium: 4.8 mmol/L (ref 3.5–5.2)
Sodium: 121 mmol/L — ABNORMAL LOW (ref 134–144)
Total Protein: 7 g/dL (ref 6.0–8.5)
eGFR: 81 mL/min/{1.73_m2} (ref >59)

## 2024-01-11 LAB — CBC
Hematocrit: 24.2 % — ABNORMAL LOW (ref 34.0–46.6)
Hemoglobin: 7.5 g/dL — ABNORMAL LOW (ref 11.1–15.9)
MCH: 29.1 pg (ref 26.6–33.0)
MCHC: 31 g/dL — ABNORMAL LOW (ref 31.5–35.7)
MCV: 94 fL (ref 79–97)
Platelets: 161 10*3/uL (ref 150–450)
RBC: 2.58 x10E6/uL — CL (ref 3.77–5.28)
RDW: 13.2 % (ref 11.7–15.4)
WBC: 5.3 10*3/uL (ref 3.4–10.8)

## 2024-01-14 LAB — SODIUM, URINE, RANDOM: Sodium, Ur: <20 mmol/L

## 2024-01-14 LAB — OSMOLALITY, URINE: Osmolality, Ur: 164 mosm/kg

## 2024-01-14 LAB — POTASSIUM, URINE, RANDOM: Potassium Urine: 23.9 mmol/L

## 2024-01-14 LAB — CHLORIDE, URINE, RANDOM: Chloride, Ur: 25 mmol/L

## 2024-01-15 ENCOUNTER — Ambulatory Visit: Payer: Self-pay | Admitting: Student

## 2024-01-31 ENCOUNTER — Ambulatory Visit (INDEPENDENT_AMBULATORY_CARE_PROVIDER_SITE_OTHER)

## 2024-01-31 VITALS — BP 194/81 | HR 63 | Temp 98.3°F | Ht 66.0 in | Wt 164.2 lb

## 2024-01-31 DIAGNOSIS — I10 Essential (primary) hypertension: Secondary | ICD-10-CM

## 2024-01-31 DIAGNOSIS — R7401 Elevation of levels of liver transaminase levels: Secondary | ICD-10-CM | POA: Diagnosis not present

## 2024-01-31 DIAGNOSIS — E871 Hypo-osmolality and hyponatremia: Secondary | ICD-10-CM | POA: Diagnosis not present

## 2024-01-31 MED ORDER — HYDRALAZINE HCL 100 MG PO TABS: 100.0000 mg | ORAL_TABLET | Freq: Three times a day (TID) | ORAL | 3 refills | Status: DC

## 2024-01-31 NOTE — Assessment & Plan Note (Signed)
 History of chronic hyponatremia, most recently 121 at 6/16 [facility] visit. She is asymptomatic. Discussion of daily food intake with moderate mix of food groups, vegetables, fruits, legumes, proteins. She does drink at least 1 alcohol beverage daily. Today in office she states she is drinking 1 beer a day, in prior notes she mixes brandy in her drinks about 1-2x daily. Unclear exact etiology of current hyponatremia. On physical exam patient is volume up today with bilateral 1+ pitting edema of lower extremities, L>R. Will recommend compression stockings and elevation for her leg swelling. Will continue to hold spironolactone  and recheck BMP.  - recheck BMP, will call patient with results - compression stockings

## 2024-01-31 NOTE — Progress Notes (Signed)
 Established Patient Office Visit  Subjective   Patient ID: Jenna May, female    DOB: 11/28/1955  Age: 68 y.o. MRN: [id number]  Chief Complaint  Patient presents with   Follow-up   Hypertension   Ms. Cockerell is a 68 yr old female with a history of hypertension, subclinical hypothyroidism, and gout, who presents to clinic today for BP recheck.   She was previously seen by Dr. Gregary on 6/16, and due to HR in the 40s and Na of 121, decision was made to lower carvedilol  from 12.5 mg to 6.25 mg, and hold spironolactone . She continues to take Olmesartan  40 mg daily. She has been adhering to this regimen and reports to clinic that her home BP readings have been creeping up in the 140s systolic. Today in office BP was 183/83 and 194/81. She forgot to bring her BP log today. She also notes more swelling in her lower legs.   ROS: Denies headaches, dizziness, fever, chills, runny nose, sore throat, vision changes, hearing changes, chest pain, shortness of breath, difficulty breathing, nausea, vomiting, abdominal pain. Denies increased urinary frequency, pain with urination, constipation or diarrhea. No recent falls. Endorses recent leg swelling.     Objective:     BP (!) 194/81 (BP Location: Right Arm, Patient Position: Sitting, Cuff Size: Small)   Pulse 63   Temp 98.3 F (36.8 C) (Oral)   Ht 5' 6 (1.676 m)   Wt 164 lb 3.2 oz (74.5 kg)   LMP 09/29/1995   SpO2 100%   BMI 26.50 kg/m  BP Readings from Last 3 Encounters:  01/31/24 (!) 194/81  01/10/24 (!) 147/68  01/03/24 138/67   Wt Readings from Last 3 Encounters:  01/31/24 164 lb 3.2 oz (74.5 kg)  01/10/24 164 lb 9.6 oz (74.7 kg)  01/01/24 152 lb (68.9 kg)      Physical Exam:   Constitutional: well-appearing female sitting in exam chair, in no acute distress. Ambulates without use of assistance device  HEENT: normocephalic atraumatic, mucous membranes moist Eyes: conjunctiva non-erythematous Cardiovascular: regular  rate and rhythm, systolic murmur at A/P area (previous Echo normal), bilateral radial pulses 2+, bilateral dorsal pedal pulses 2+, brisk capillary refill bilateral hands  Pulmonary/Chest: normal work of breathing on room air, lungs clear to auscultation bilaterally Abdominal: soft, non-tender, non-distended MSK: normal bulk and tone. Neurological: alert & oriented x 3 Skin: warm and dry, healing areas on left hand from prior pustules Psych: mood calm, behavior normal, thought content normal, judgement normal     No results found for any visits on 01/31/24.  Last metabolic panel Lab Results  Component Value Date   GLUCOSE 83 01/10/2024   NA 121 (L) 01/10/2024   K 4.8 01/10/2024   CL 94 (L) 01/10/2024   CO2 15 (L) 01/10/2024   BUN 16 01/10/2024   CREATININE 0.81 01/10/2024   EGFR 81 01/10/2024   CALCIUM 9.2 01/10/2024   PROT 7.0 01/10/2024   ALBUMIN 3.7 (L) 01/10/2024   LABGLOB 3.3 01/10/2024   AGRATIO 1.0 (L) 05/26/2022   BILITOT <0.2 01/10/2024   ALKPHOS 114 01/10/2024   AST 33 01/10/2024   ALT 49 (H) 01/10/2024   ANIONGAP 7 01/03/2024      The ASCVD Risk score (Arnett DK, et al., 2019) failed to calculate for the following reasons:   The valid total cholesterol range is 130 to 320 mg/dL    Assessment & Plan:   Problem List Items Addressed This Visit  Cardiovascular and Mediastinum   Essential hypertension (Chronic)   Patient returns today for blood pressure recheck and discussion of her medication regimen. At her last visit on 6/16 with Dr. Gregary, patient's HR was in the 40s and BMP with sodium of 121. She was advised to hold the spironolactone  and decrease carvedilol  from 12.5 mg to 6.25 mg twice daily. She continued olmesartan  40 mg day. Since then, patient states her home BP readings which were previously in the 120s/80s, have been slowly creeping up and today in office she is 183/83 on repeat. She forgot to bring her BP log today. Given patient's abnormal BMP  reading, will recheck that today to see if holding spironolactone  had any effect on her electrolytes. Do not recommend increasing her carvedilol  again since HR is in 60s-70s now. She cannot take amlodipine  or hydrochlorothiazide  given adverse reactions, history of gout, and current BMP as well. Unfortunately the next anti-hypertensive medication we will try is Hydralazine . Following joint decision making with patient and discussion of 3x daily dosing, will proceed with Hydralazine  10 mg TID to start. She will continue to hold the spironolactone .  - recheck BMP, will call patient with results  - send Hydralazine  10 mg TID       Relevant Medications   hydrALAZINE  (APRESOLINE ) 100 MG tablet     Other   Transaminitis   History of transaminitis. Fib-4 today, based on 6/16 labs, is 1.9 indicating advanced fibrosis excluded. Outside RUQ ultrasound from [facility] performed 09/2023 was normal without evidence of cirrhosis.       Hyponatremia - Primary   History of chronic hyponatremia, most recently 121 at 6/16 [facility] visit. She is asymptomatic. Discussion of daily food intake with moderate mix of food groups, vegetables, fruits, legumes, proteins. She does drink at least 1 alcohol beverage daily. Today in office she states she is drinking 1 beer a day, in prior notes she mixes brandy in her drinks about 1-2x daily. Unclear exact etiology of current hyponatremia. On physical exam patient is volume up today with bilateral 1+ pitting edema of lower extremities, L>R. Will recommend compression stockings and elevation for her leg swelling. Will continue to hold spironolactone  and recheck BMP.  - recheck BMP, will call patient with results - compression stockings       Relevant Orders   BMP8+Anion Gap    Return in about 3 weeks (around 02/21/2024) for BP check .    Patient discussed with Dr. Lovie, who also saw and evaluated the patient.  Doyal Miyamoto, MD [facility]  PGY-1  01/31/24,  1:27 PM

## 2024-01-31 NOTE — Assessment & Plan Note (Signed)
 Patient returns today for blood pressure recheck and discussion of her medication regimen. At her last visit on 6/16 with Dr. Gregary, patient's HR was in the 40s and BMP with sodium of 121. She was advised to hold the spironolactone  and decrease carvedilol  from 12.5 mg to 6.25 mg twice daily. She continued olmesartan  40 mg day. Since then, patient states her home BP readings which were previously in the 120s/80s, have been slowly creeping up and today in office she is 183/83 on repeat. She forgot to bring her BP log today. Given patient's abnormal BMP reading, will recheck that today to see if holding spironolactone  had any effect on her electrolytes. Do not recommend increasing her carvedilol  again since HR is in 60s-70s now. She cannot take amlodipine  or hydrochlorothiazide  given adverse reactions, history of gout, and current BMP as well. Unfortunately the next anti-hypertensive medication we will try is Hydralazine . Following joint decision making with patient and discussion of 3x daily dosing, will proceed with Hydralazine  10 mg TID to start. She will continue to hold the spironolactone .  - recheck BMP, will call patient with results  - send Hydralazine  10 mg TID

## 2024-01-31 NOTE — Patient Instructions (Signed)
 Thank you, [name] for allowing us  to provide your care today. Today we discussed the following:  - high blood pressure - electrolyte abnormalities seen on prior labs (low sodium) - Swelling: compression stockings from [employer]   I have ordered the following labs for you:   Lab Orders         BMP8+Anion Gap       I have ordered the following medication/changed the following medications:   Stop the following medications: There are no discontinued medications.   Start the following medications: Meds ordered this encounter  Medications   hydrALAZINE  (APRESOLINE ) 100 MG tablet    Sig: Take 1 tablet (100 mg total) by mouth 3 (three) times daily.    Dispense:  270 tablet    Refill:  3     Follow up: 3-4 weeks    Remember: Please bring your home blood pressure log to your follow up appointment   Should you have any questions or concerns please call the Internal Medicine Clinic at [phone].     Jenna Miyamoto, MD [facility]

## 2024-01-31 NOTE — Assessment & Plan Note (Addendum)
 History of transaminitis. Fib-4 today, based on 6/16 labs, is 1.9 indicating advanced fibrosis excluded. Outside RUQ ultrasound from [facility] performed 09/2023 was normal without evidence of cirrhosis.

## 2024-01-31 NOTE — Progress Notes (Signed)
 Internal Medicine Clinic Attending  I was physically present during the key portions of the resident provided service and participated in the medical decision making of patient's management care. I reviewed pertinent patient test results.  The assessment, diagnosis, and plan were formulated together and I agree with the documentation in the resident's note.  Lovie Clarity, MD    For her hypertension, we're not able to use most first-line options. She has an allergy to Amlodipine . Her severe hyponatremia making thiazide diuretics a poor choice. Her ARB is already maxed. I agree that it would not be safe to increase beta blocker dose, and I agree that we should not restart the Spironolactone  in the setting of this hyponatremia. So, I agree with starting Hydralazine .   Final plan for hyponatremia depends on today's BMP, but with her low urine osms, I think there may be a component of poor solute intake. We counseled patient to eat plenty of proteins & fats in addition to carbs today.

## 2024-02-01 ENCOUNTER — Other Ambulatory Visit: Payer: Self-pay

## 2024-02-01 ENCOUNTER — Telehealth: Payer: Self-pay | Admitting: *Deleted

## 2024-02-01 ENCOUNTER — Ambulatory Visit: Payer: Self-pay

## 2024-02-01 LAB — BMP8+ANION GAP
Anion Gap: 15 mmol/L (ref 10.0–18.0)
BUN/Creatinine Ratio: 13 (ref 12–28)
BUN: 11 mg/dL (ref 8–27)
CO2: 15 mmol/L — ABNORMAL LOW (ref 20–29)
Calcium: 9.6 mg/dL (ref 8.7–10.3)
Chloride: 97 mmol/L (ref 96–106)
Creatinine, Ser: 0.88 mg/dL (ref 0.57–1.00)
Glucose: 80 mg/dL (ref 70–99)
Potassium: 4.4 mmol/L (ref 3.5–5.2)
Sodium: 127 mmol/L — ABNORMAL LOW (ref 134–144)
eGFR: 73 mL/min/1.73 (ref >59)

## 2024-02-01 MED ORDER — HYDRALAZINE HCL 10 MG PO TABS: 10.0000 mg | ORAL_TABLET | Freq: Three times a day (TID) | ORAL | 3 refills | Status: DC

## 2024-02-01 NOTE — Telephone Encounter (Signed)
 Return call to pt who stated the doctor told her to take 10 mg of Hydralazine  but the pharmacy sent her 100 mg. She stated she took 1 dose this morning; I asked her how's she feeling, she stated light-headed, dizziness I won't be able to take 100 mg 3 times a day. Then pt stated she feels like she going to throw-up. I asked pt if she's alone; stated she is. I told pt to call 911; stated she will be alright. I told pt not to take anymore; pt stated the doctor going to have to lower the dose. I told pt I will talk to the doctor; I asked pt to check her BP, stated she will in a minute; BP machine is in another room and she's dizzy. I also advised pt to go to the ER; she might need fluids - pt stated she will drink some water and it will pass. I called her emergency contact person on the chart; no answer, left message for him to check on pt.

## 2024-02-01 NOTE — Telephone Encounter (Signed)
 Copied from CRM [id number]. Topic: Clinical - Medication Question >> Feb 01, 2024 10:52 AM Diannia H wrote: Reason for CRM: Patient said the provider gave her a new medicine hydrALAZINE  (APRESOLINE ) 100 MG tabletand it was suppose to be 10 MG but it says 100 MG, could you call the patient and clarify what the MG is suppose to be? [phone] patient already took the medicine this morning.

## 2024-02-04 ENCOUNTER — Telehealth: Payer: Self-pay

## 2024-02-04 ENCOUNTER — Ambulatory Visit: Payer: Self-pay

## 2024-02-04 NOTE — Telephone Encounter (Unsigned)
 Copied from CRM [id number]. Topic: Clinical - Medication Refill >> Feb 04, 2024 12:18 PM Zane F wrote: The patient contacted the office and stated that she previously had stopped taking the following prescription but was told my a previous provider that it would help when she experiences gout flare ups. The patient states that the prescription listed has worked in the pat to treat these symptoms. She is out of the prescription.   Medication: naproxen  (NAPROSYN ) 500 MG tablet  Has the patient contacted their pharmacy? Yes  This is the patient's preferred pharmacy:  [id number] - [place] ([place]), [place] - [address] [place] ([place]) [place] [address] Phone: [phone] Fax: [phone]   Is this the correct pharmacy for this prescription? Yes  Has the prescription been filled recently? No  Is the patient out of the medication? Yes  Has the patient been seen for an appointment in the last year OR does the patient have an upcoming appointment? Yes  Can we respond through MyChart? No  Agent: Please be advised that Rx refills may take up to 3 business days. We ask that you follow-up with your pharmacy.

## 2024-02-04 NOTE — Telephone Encounter (Signed)
 FYI Only or Action Required?: Action required by provider: medication refill request.  Patient was last seen in primary care on 01/31/2024 by Leontine Lapine, MD.  Called Nurse Triage reporting Gout.  Symptoms began several days ago. Triage Disposition: Call PCP When Office is Open  Patient/caregiver understands and will follow disposition?: Yes                      Copied from CRM [id number]. Topic: Clinical - Red Word Triage >> Feb 04, 2024 12:23 PM Zane F wrote: Red Word that prompted transfer to Nurse Triage:   Gout flare up  Legs, pinky finger into joint, a little swelling in feet  Patient stated she was experiencing some of the swelling during recent appointment but the swelling in her hands is a recent development. Patient believes its a gout flare up and she just needs a naproxen  refill Reason for Disposition  [1] Prescription refill request for NON-ESSENTIAL medicine (i.e., no harm to patient if med not taken) AND [2] triager unable to refill per department policy  Answer Assessment - Initial Assessment Questions Patient is requesting a refill of naproxen  for her gout flare up as she states this helped it in the past  Naproxen  500 mg  [id number] - [place] ([place]), [place] - [address], [place] ([place]) [place] [address] Phone: [phone]  Fax: [phone]    Left pinky finger gout flare up Onset: A few days ago  Protocols used: Medication Refill and Renewal Call-A-AH

## 2024-02-07 ENCOUNTER — Other Ambulatory Visit: Payer: Self-pay

## 2024-02-07 DIAGNOSIS — M1A09X0 Idiopathic chronic gout, multiple sites, without tophus (tophi): Secondary | ICD-10-CM

## 2024-02-07 MED ORDER — NAPROXEN 500 MG PO TABS
ORAL_TABLET | ORAL | 1 refills | Status: DC
Start: 2024-02-07 — End: 2024-02-29

## 2024-02-07 NOTE — Telephone Encounter (Signed)
 Copied from CRM [id number]. Topic: Clinical - Prescription Issue >> Feb 07, 2024  9:15 AM Farrel B wrote: Reason for CRM: Received a call from [phone]. Patient stated that she had called in her  naproxen  (NAPROSYN ) 500 MG tablet on Friday 02/04/2024 she stated she is having severe flare ups with gout and this medication is the only thing that will help her. I advised the patient that we do inform our pts its 24-48 hours up to 3 business days and by calling in when medication is running low, will give ample time for pharmacy to fill and it would eliminate the refill delays. Patient stated she will most definitely call in early now. Please call patient and update on refill status.

## 2024-02-08 ENCOUNTER — Other Ambulatory Visit: Payer: Self-pay

## 2024-02-08 DIAGNOSIS — I10 Essential (primary) hypertension: Secondary | ICD-10-CM

## 2024-02-08 MED ORDER — OLMESARTAN MEDOXOMIL 40 MG PO TABS: 40.0000 mg | ORAL_TABLET | Freq: Every day | ORAL | 0 refills | Status: DC

## 2024-02-08 NOTE — Telephone Encounter (Signed)
 Medication sent to pharmacy

## 2024-02-14 ENCOUNTER — Other Ambulatory Visit: Payer: No Typology Code available for payment source

## 2024-02-21 ENCOUNTER — Telehealth: Payer: Self-pay

## 2024-02-21 NOTE — Telephone Encounter (Signed)
Will forward to [facility]

## 2024-02-21 NOTE — Telephone Encounter (Signed)
 Stop hydralazine . Medication added to allergy list with low-severity rash associated. Return to clinic for follow-up of hypertension.  Medications Discontinued During This Encounter  Medication Reason   hydrALAZINE  (APRESOLINE ) 10 MG tablet    Ozell Kung MD 02/21/2024, 5:51 PM

## 2024-02-21 NOTE — Addendum Note (Signed)
 Addended by: NORRINE SHARPER on: 02/21/2024 05:51 PM   Modules accepted: Orders

## 2024-02-21 NOTE — Telephone Encounter (Signed)
 FYI Only or Action Required?: Action required by provider: Reason for CRM: Patient states that she has been taking hydrALAZINE  (APRESOLINE ) 10 MG tablet since 02/01/24 and it is breaking her out. Patient is having red spots on her legs, no other symptoms. Patient states that she stopped taking the medication for 3 days and her legs got better. . Patient wants to know if she can still take olmesartan  (BENICAR ) 40 MG tablet along with a lower dose of the same medication to help with her blood pressure since the other medication is breaking her out.   Patient would also like to speak with her PCP as well.  Patient was last seen in primary care on 01/31/2024 by Leontine Lapine, MD.  Called Nurse Triage reporting No chief complaint on file..  Symptoms began several days ago.  Interventions attempted: Nothing.  Symptoms are: unchanged.  Triage Disposition: No disposition on file.  Patient/caregiver understands and will follow disposition?:      Copied from CRM [id number]. Topic: Clinical - Medical Advice >> Feb 21, 2024  8:57 AM Rosaria A wrote: Reason for CRM: Patient states that she has been taking hydrALAZINE  (APRESOLINE ) 10 MG tablet since 02/01/24 and it is breaking her out. Patient is having red spots on her legs, no other symptoms. Patient states that she stopped taking the medication for 3 days and her legs got better.   Patient wants to know if she can still take olmesartan  (BENICAR ) 40 MG tablet along with a lower dose of the same medication to help with her blood pressure since the other medication is breaking her out.   Patient would also like to speak with her PCP as well.

## 2024-02-22 NOTE — Telephone Encounter (Signed)
 RTC to patient.  Was given message per Dr. Norrine to stop the Hydralazine .  Patient has not taken any more since her rash.  Patient was also told that she needs to schedule an appointment to come in for her blood pressure. Stated will contact her brother to find out when he can bring her in for an appointment.

## 2024-02-29 ENCOUNTER — Ambulatory Visit: Payer: Self-pay | Admitting: Student

## 2024-02-29 VITALS — BP 200/95 | HR 68 | Temp 98.4°F | Ht 66.0 in | Wt 166.4 lb

## 2024-02-29 DIAGNOSIS — I10 Essential (primary) hypertension: Secondary | ICD-10-CM | POA: Diagnosis not present

## 2024-02-29 DIAGNOSIS — M1A09X0 Idiopathic chronic gout, multiple sites, without tophus (tophi): Secondary | ICD-10-CM | POA: Diagnosis not present

## 2024-02-29 MED ORDER — CARVEDILOL 12.5 MG PO TABS: 12.5000 mg | ORAL_TABLET | Freq: Two times a day (BID) | ORAL | 11 refills | Status: AC

## 2024-02-29 MED ORDER — ACETAMINOPHEN 500 MG PO TABS: 500.0000 mg | ORAL_TABLET | Freq: Four times a day (QID) | ORAL | Status: AC | PRN

## 2024-02-29 NOTE — Patient Instructions (Addendum)
 Return in about 3 months (around 05/31/2024) for hypertension.  Work on [employer] loss and your new eating plan.  Start carvedilol  12.5 mg twice daily.  Remember to bring all of the medications that you take (including over the counter medications and supplements) with you to every clinic visit.  This after visit summary is an important review of tests, referrals, and medication changes that were discussed during your visit. If you have questions or concerns, call [phone]. Outside of clinic business hours, call the main hospital at [phone] and ask the operator for the on-call internal medicine resident.   Ozell Kung MD 02/29/2024, 9:26 AM

## 2024-02-29 NOTE — Progress Notes (Signed)
 Patient name: Jenna May Date of birth: 22-Feb-1956 Date of visit: 02/29/24  Subjective   Chief concern: high blood pressure  We've had difficulty managing her hypertension of late.  She checks her blood pressure at home and shows me readings that are predominantly in 130-140 systolic with some recent readings in the 150s.  Her blood pressure is persistently high in clinical settings, last 3 shown below:  BP Readings from Last 3 Encounters:  02/29/24 (!) 200/95  01/31/24 (!) 194/81  01/10/24 (!) 147/68   At last visit hydralazine  10 mg tid was added to her regimen of olmesartan  40 mg and carvedilol  6.25 mg. Unfortunately she broke out in a rash after taking the hydralazine  and it was discontinued.   In the past she has tried amlodipine , stopped for gingival hyperplasia; hydrochlorothiazide , stopped for gout; spironolactone , stopped for hyponatremia requiring hospitalization.  She continues on the olmesartan  and carvedilol .  Review of Systems  Respiratory:  Negative for cough and shortness of breath.   Cardiovascular:  Positive for leg swelling. Negative for chest pain and orthopnea.    Current Outpatient Medications  Medication Instructions   acetaminophen  (TYLENOL ) 500 mg, Oral, Every 6 hours PRN   carvedilol  (COREG ) 12.5 mg, Oral, 2 times daily   fish oil-omega-3 fatty acids  2 g, Oral, Daily   Menthol-Methyl Salicylate (MUSCLE RUB) 10-15 % CREA 1 application , As needed   olmesartan  (BENICAR ) 40 mg, Oral, Daily     Objective  Today's Vitals   02/29/24 0830 02/29/24 0837 02/29/24 0840  BP: (!) 199/85 (!) 199/84 (!) 200/95  Pulse: 68 68 68  Temp: 98.4 F (36.9 C)    TempSrc: Oral    SpO2: 99%    Weight: 166 lb 6.4 oz (75.5 kg)    Height: 5' 6 (1.676 m)    Body mass index is 26.86 kg/m.   Physical Exam Constitutional:      Appearance: Normal appearance.  Cardiovascular:     Rate and Rhythm: Normal rate and regular rhythm.     Heart sounds: No murmur  heard. Pulmonary:     Effort: Pulmonary effort is normal. No respiratory distress.  Musculoskeletal:     Right lower leg: Edema present.     Left lower leg: Edema present.  Skin:    General: Skin is warm and dry.  Neurological:     Mental Status: She is alert.     Cranial Nerves: No facial asymmetry.  Psychiatric:        Mood and Affect: Affect normal.        Speech: Speech normal.        Behavior: Behavior normal.      Assessment & Plan   Chronic gout of multiple sites, unspecified cause -     Acetaminophen ; Take 1 tablet (500 mg total) by mouth every 6 (six) hours as needed.  Essential hypertension Assessment & Plan: Chronic, poor control with intolerance to many frontline agents. There's discordance between her home measurements and clinic measurements. Today she has some leg swelling but no other signs of volume overload. I think her heart rate will tolerate increasing her carvedilol  to 12.5 mg BID. Continue olmesartan  40 mg daily. Recommend DASH diet--she would benefit from a low-sodium diet (she notes a diet consisting of occasional processed foods including bologna and hot dogs). Clonidine may be the next best medication option. I think her case is complex enough to warrant specialist referral and thus I'm going to reach out to Dr. Raford with the  [facility].  Orders: -     AMB Referral [facility] Care Management -     Carvedilol ; Take 1 tablet (12.5 mg total) by mouth 2 (two) times daily.  Dispense: 60 tablet; Refill: 11    Return in about 3 months (around 05/31/2024) for hypertension.  Ozell Kung MD 02/29/2024, 3:56 PM

## 2024-02-29 NOTE — Assessment & Plan Note (Addendum)
 Chronic, poor control with intolerance to many frontline agents. There's discordance between her home measurements and clinic measurements. Today she has some leg swelling but no other signs of volume overload. I think her heart rate will tolerate increasing her carvedilol  to 12.5 mg BID. Continue olmesartan  40 mg daily. Recommend DASH diet--she would benefit from a low-sodium diet (she notes a diet consisting of occasional processed foods including bologna and hot dogs). Clonidine may be the next best medication option. I think her case is complex enough to warrant specialist referral and thus I'm going to reach out to Dr. Raford with the [facility].

## 2024-03-01 NOTE — Progress Notes (Signed)
 Internal Medicine Clinic Attending  Case discussed with the resident at the time of the visit.  We reviewed the resident's history and exam and pertinent patient test results.  I agree with the assessment, diagnosis, and plan of care documented in the resident's note.

## 2024-03-03 ENCOUNTER — Telehealth: Payer: Self-pay | Admitting: Student

## 2024-03-03 DIAGNOSIS — I10 Essential (primary) hypertension: Secondary | ICD-10-CM

## 2024-03-03 NOTE — Telephone Encounter (Signed)
 Spoke with Jenna May about seeing hypertension specialist. She's amenable to this.  Referral Orders         Ambulatory referral to Cardiology     Ozell Kung MD 03/03/2024, 9:24 AM

## 2024-03-07 ENCOUNTER — Other Ambulatory Visit: Payer: Self-pay | Admitting: Student

## 2024-03-07 DIAGNOSIS — I10 Essential (primary) hypertension: Secondary | ICD-10-CM

## 2024-03-07 NOTE — Telephone Encounter (Signed)
 Medication sent to pharmacy

## 2024-03-16 ENCOUNTER — Other Ambulatory Visit: Payer: Self-pay

## 2024-03-16 ENCOUNTER — Emergency Department (HOSPITAL_COMMUNITY)

## 2024-03-16 ENCOUNTER — Encounter (HOSPITAL_COMMUNITY): Payer: Self-pay | Admitting: Emergency Medicine

## 2024-03-16 ENCOUNTER — Emergency Department (HOSPITAL_COMMUNITY): Admission: EM | Admit: 2024-03-16 | Discharge: 2024-03-16 | Disposition: A | Discharge status: Home / Self Care

## 2024-03-16 DIAGNOSIS — R7989 Other specified abnormal findings of blood chemistry: Secondary | ICD-10-CM | POA: Diagnosis not present

## 2024-03-16 DIAGNOSIS — R600 Localized edema: Secondary | ICD-10-CM | POA: Diagnosis not present

## 2024-03-16 DIAGNOSIS — I10 Essential (primary) hypertension: Secondary | ICD-10-CM | POA: Diagnosis not present

## 2024-03-16 DIAGNOSIS — M7989 Other specified soft tissue disorders: Secondary | ICD-10-CM

## 2024-03-16 DIAGNOSIS — I700 Atherosclerosis of aorta: Secondary | ICD-10-CM | POA: Diagnosis not present

## 2024-03-16 DIAGNOSIS — R2240 Localized swelling, mass and lump, unspecified lower limb: Secondary | ICD-10-CM | POA: Diagnosis not present

## 2024-03-16 DIAGNOSIS — Z79899 Other long term (current) drug therapy: Secondary | ICD-10-CM | POA: Diagnosis not present

## 2024-03-16 LAB — BASIC METABOLIC PANEL WITH GFR
Anion gap: 13 (ref 5–15)
BUN: 18 mg/dL (ref 8–23)
CO2: 17 mmol/L — ABNORMAL LOW (ref 22–32)
Calcium: 9.1 mg/dL (ref 8.9–10.3)
Chloride: 99 mmol/L (ref 98–111)
Creatinine, Ser: 0.89 mg/dL (ref 0.44–1.00)
GFR, Estimated: >60 mL/min (ref >60)
Glucose, Bld: 98 mg/dL (ref 70–99)
Potassium: 4.3 mmol/L (ref 3.5–5.1)
Sodium: 129 mmol/L — ABNORMAL LOW (ref 135–145)

## 2024-03-16 LAB — CBC WITH DIFFERENTIAL/PLATELET
Abs Immature Granulocytes: 0.01 K/uL (ref 0.00–0.07)
Basophils Absolute: 0 K/uL (ref 0.0–0.1)
Basophils Relative: 1 %
Eosinophils Absolute: 0.1 K/uL (ref 0.0–0.5)
Eosinophils Relative: 2 %
HCT: 27 % — ABNORMAL LOW (ref 36.0–46.0)
Hemoglobin: 8.8 g/dL — ABNORMAL LOW (ref 12.0–15.0)
Immature Granulocytes: 0 %
Lymphocytes Relative: 27 %
Lymphs Abs: 1.8 K/uL (ref 0.7–4.0)
MCH: 27.8 pg (ref 26.0–34.0)
MCHC: 32.6 g/dL (ref 30.0–36.0)
MCV: 85.4 fL (ref 80.0–100.0)
Monocytes Absolute: 0.5 K/uL (ref 0.1–1.0)
Monocytes Relative: 7 %
Neutro Abs: 4.2 K/uL (ref 1.7–7.7)
Neutrophils Relative %: 63 %
Platelets: 191 K/uL (ref 150–400)
RBC: 3.16 MIL/uL — ABNORMAL LOW (ref 3.87–5.11)
RDW: 13.3 % (ref 11.5–15.5)
WBC: 6.6 K/uL (ref 4.0–10.5)
nRBC: 0 % (ref 0.0–0.2)

## 2024-03-16 LAB — TSH: TSH: 7.199 u[IU]/mL — ABNORMAL HIGH (ref 0.350–4.500)

## 2024-03-16 LAB — BRAIN NATRIURETIC PEPTIDE: B Natriuretic Peptide: 285.9 pg/mL — ABNORMAL HIGH (ref 0.0–100.0)

## 2024-03-16 NOTE — ED Provider Notes (Signed)
 [facility] Provider Note   CSN: [id number] Arrival date & time: 03/16/24  9297     Patient presents with: Leg Swelling   Jenna May is a 68 y.o. female.   68 year old female with past medical history of hypertension and chronic hyponatremia presenting to the emergency department today with lower extremity swelling.  The patient states this been going now since she got out of the hospital recently.  The patient states that she is having swelling in her lower extremities.  Denies any chest pain or shortness of breath with this.  It appears that she is had changes made in her blood pressure medications and has been referred to a hypertension specialist because she has had issues with multiple medications in the past.  She is here today for further evaluation regarding this.        Prior to Admission medications   Medication Sig Start Date End Date Taking? Authorizing Provider  acetaminophen  (TYLENOL ) 500 MG tablet Take 1 tablet (500 mg total) by mouth every 6 (six) hours as needed. 02/29/24   Norrine Sharper, MD  carvedilol  (COREG ) 12.5 MG tablet Take 1 tablet (12.5 mg total) by mouth 2 (two) times daily. 02/29/24 02/28/25  Norrine Sharper, MD  fish oil-omega-3 fatty acids  1000 MG capsule Take 2 capsules (2 g total) by mouth daily. 07/12/12   Kalia-Reynolds, Maitri S, DO  Menthol-Methyl Salicylate (MUSCLE RUB) 10-15 % CREA Apply 1 application topically as needed for muscle pain.    [provider]  olmesartan  (BENICAR ) 40 MG tablet Take 1 tablet by mouth once daily 03/07/24   Norrine Sharper, MD    Allergies: Amlodipine , Hydrochlorothiazide , Spironolactone , and Hydralazine     Review of Systems  Cardiovascular:  Positive for leg swelling.  All other systems reviewed and are negative.   Updated Vital Signs BP (!) 179/64   Pulse 63   Temp 98.6 F (37 C)   Resp 16   Ht 5' 6 (1.676 m)   Wt 75 kg   LMP 09/29/1995   SpO2  100%   BMI 26.69 kg/m   Physical Exam Vitals and nursing note reviewed.   Gen: NAD Eyes: PERRL, EOMI HEENT: no oropharyngeal swelling Neck: trachea midline Resp: clear to auscultation bilaterally Card: RRR, no murmurs, rubs, or gallops Abd: nontender, nondistended Extremities: 2+ pitting edema noted to the bilateral lower extremities Vascular: 2+ radial pulses bilaterally, 2+ DP pulses bilaterally Skin: no rashes Psyc: acting appropriately   (all labs ordered are listed, but only abnormal results are displayed) Labs Reviewed  CBC WITH DIFFERENTIAL/PLATELET - Abnormal; Notable for the following components:      Result Value   RBC 3.16 (*)    Hemoglobin 8.8 (*)    HCT 27.0 (*)    All other components within normal limits  BASIC METABOLIC PANEL WITH GFR - Abnormal; Notable for the following components:   Sodium 129 (*)    CO2 17 (*)    All other components within normal limits  TSH - Abnormal; Notable for the following components:   TSH 7.199 (*)    All other components within normal limits  BRAIN NATRIURETIC PEPTIDE - Abnormal; Notable for the following components:   B Natriuretic Peptide 285.9 (*)    All other components within normal limits    EKG: None  Radiology: VAS US  LOWER EXTREMITY VENOUS (DVT) (ONLY [facility] & [facility]) Result Date: 03/16/2024  Lower Venous DVT Study Patient Name:  Jenna May  Date of  Exam:   03/16/2024 Medical Rec #: [id number]         Accession #:    [id number] Date of Birth: [birth year]        Patient Gender: F Patient Age:   73 years Exam Location:  [facility] Procedure:      VAS US  LOWER EXTREMITY VENOUS (DVT) Referring Phys: Tanzie Rothschild --------------------------------------------------------------------------------  Indications: Swelling, and Edema.  Performing Technologist: Elmarie Lindau, RVT  Examination Guidelines: A complete evaluation includes B-mode imaging, spectral Doppler, color Doppler, and power Doppler as needed of all accessible  portions of each vessel. Bilateral testing is considered an integral part of a complete examination. Limited examinations for reoccurring indications may be performed as noted. The reflux portion of the exam is performed with the patient in reverse Trendelenburg.  +---------+---------------+---------+-----------+----------+--------------+ RIGHT    CompressibilityPhasicitySpontaneityPropertiesThrombus Aging +---------+---------------+---------+-----------+----------+--------------+ CFV      Full           Yes      Yes                                 +---------+---------------+---------+-----------+----------+--------------+ SFJ      Full                                                        +---------+---------------+---------+-----------+----------+--------------+ FV Prox  Full                                                        +---------+---------------+---------+-----------+----------+--------------+ FV Mid   Full                                                        +---------+---------------+---------+-----------+----------+--------------+ FV DistalFull                                                        +---------+---------------+---------+-----------+----------+--------------+ PFV      Full                                                        +---------+---------------+---------+-----------+----------+--------------+ POP      Full           Yes      Yes                                 +---------+---------------+---------+-----------+----------+--------------+ PTV      Full                                                        +---------+---------------+---------+-----------+----------+--------------+  PERO     Full                                                        +---------+---------------+---------+-----------+----------+--------------+   +---------+---------------+---------+-----------+----------+--------------+ LEFT      CompressibilityPhasicitySpontaneityPropertiesThrombus Aging +---------+---------------+---------+-----------+----------+--------------+ CFV      Full           Yes      Yes                                 +---------+---------------+---------+-----------+----------+--------------+ SFJ      Full                                                        +---------+---------------+---------+-----------+----------+--------------+ FV Prox  Full                                                        +---------+---------------+---------+-----------+----------+--------------+ FV Mid   Full                                                        +---------+---------------+---------+-----------+----------+--------------+ FV DistalFull                                                        +---------+---------------+---------+-----------+----------+--------------+ PFV      Full                                                        +---------+---------------+---------+-----------+----------+--------------+ POP      Full           Yes      Yes                                 +---------+---------------+---------+-----------+----------+--------------+ PTV      Full                                                        +---------+---------------+---------+-----------+----------+--------------+ PERO     Full                                                        +---------+---------------+---------+-----------+----------+--------------+  Summary: RIGHT: - There is no evidence of deep vein thrombosis in the lower extremity.  - No cystic structure found in the popliteal fossa.  LEFT: - There is no evidence of deep vein thrombosis in the lower extremity.  - No cystic structure found in the popliteal fossa.  *See table(s) above for measurements and observations.    Preliminary    DG Chest Portable 1 View Result Date: 03/16/2024 CLINICAL DATA:  Leg swelling EXAM:  PORTABLE CHEST 1 VIEW COMPARISON:  01/29/2011 FINDINGS: Atherosclerotic calcification of the aortic arch. Thoracic spondylosis. Upper normal cardiac size. The lungs appear clear.  No blunting of the costophrenic angles. Mild dextroconvex lower thoracic scoliosis. IMPRESSION: 1. No acute findings. 2. Thoracic spondylosis. 3. Aortic Atherosclerosis (ICD10-I70.0). Electronically Signed   By: Ryan Salvage M.D.   On: 03/16/2024 09:38     Procedures   Medications Ordered in the ED - No data to display                                  Medical Decision Making 68 year old female with past medical history of hypertension and chronic hyponatremia in the past presenting to the emergency department today with lower extremity edema.  I will further evaluate the patient here with basic labs to evaluate for renal dysfunction or electrolyte abnormalities.  Will obtain ultrasounds to evaluate for DVT given her recent hospitalization.  Will also obtain a chest x-ray and BNP to evaluate for undiagnosed CHF.  I will reevaluate for ultimate disposition.  The patient's chest x-ray does not show any findings consistent with CHF.  She remained stable here.  DVT studies are negative.  BNP is mildly elevated.  The patient is not requiring any oxygen.  Think that she is stable for outpatient follow-up.  She is discharged with return precautions.  Given the elevated BNP and lower extremity swelling I did place an outpatient referral to cardiology.  She is discharged with return precautions.  Amount and/or Complexity of Data Reviewed Labs: ordered. Radiology: ordered.        Final diagnoses:  Peripheral edema  Elevated brain natriuretic peptide (BNP) level    ED Discharge Orders          Ordered    Ambulatory referral to Cardiology       Comments: If you have not heard from the Cardiology office within the next 72 hours please call [phone].   03/16/24 1037               Ula Prentice SAUNDERS,  MD 03/16/24 1038

## 2024-03-16 NOTE — ED Triage Notes (Signed)
 Pt endorses leg swelling and feet tingling since last time she was in hospital, about a month ago. No relief with tylenol , worse at night.

## 2024-03-16 NOTE — ED Notes (Signed)
 Pt ambulated to restroom without difficulty

## 2024-03-16 NOTE — Discharge Instructions (Signed)
 Please start wearing the compression stockings as we discussed.  Follow-up with your doctor.  I have made a referral to cardiology so you may receive a call in the next few days.  Return to the emergency department for worsening symptoms.

## 2024-03-16 NOTE — Progress Notes (Signed)
 BLE venous exam is completed. Bronda Alfred, RVT

## 2024-03-17 ENCOUNTER — Telehealth: Payer: Self-pay | Admitting: *Deleted

## 2024-03-17 NOTE — Progress Notes (Signed)
 Complex Care Management Note Care Guide Note  03/17/2024 Name: Jenna May MRN: [id number] DOB: Nov 21, 1955   Complex Care Management Outreach Attempts: An unsuccessful telephone outreach was attempted today to offer the patient information about available complex care management services.  Follow Up Plan:  Additional outreach attempts will be made to offer the patient complex care management information and services.   Encounter Outcome:  No Answer  Harlene Satterfield  [facility]  [facility], [facility] Guide  Direct Dial: [phone]  Fax [phone]

## 2024-03-21 NOTE — Progress Notes (Unsigned)
 Complex Care Management Note Care Guide Note  03/21/2024 Name: Jenna May MRN: [id number] DOB: 1956-01-18   Complex Care Management Outreach Attempts: A second unsuccessful outreach was attempted today to offer the patient with information about available complex care management services.  Follow Up Plan:  Additional outreach attempts will be made to offer the patient complex care management information and services.   Encounter Outcome:  No Answer  Harlene Satterfield  [facility]  [facility], [facility] Guide  Direct Dial: [phone]  Fax [phone]

## 2024-03-23 ENCOUNTER — Telehealth: Payer: Self-pay | Admitting: *Deleted

## 2024-03-23 ENCOUNTER — Other Ambulatory Visit: Payer: Self-pay | Admitting: Internal Medicine

## 2024-03-23 DIAGNOSIS — Z1231 Encounter for screening mammogram for malignant neoplasm of breast: Secondary | ICD-10-CM

## 2024-03-23 NOTE — Progress Notes (Signed)
 Complex Care Management Note Care Guide Note  03/23/2024 Name: Jenna May MRN: [id number] DOB: 01-Mar-1956   Complex Care Management Outreach Attempts: A third unsuccessful outreach was attempted today to offer the patient with information about available complex care management services.  Follow Up Plan:  No further outreach attempts will be made at this time. We have been unable to contact the patient to offer or enroll patient in complex care management services.  Encounter Outcome:  No Answer  Harlene Satterfield  [facility]  [facility], [facility] Guide  Direct Dial: [phone]  Fax [phone]

## 2024-03-23 NOTE — Telephone Encounter (Signed)
 Mammogram appointment December 22.02025 @ 2:00 pm to arrive 1:45 pm  bre[facility] center [phone]. Appointment mailed to the patient with the information regarding the NO SHOW fee.

## 2024-04-09 ENCOUNTER — Other Ambulatory Visit: Payer: Self-pay | Admitting: Student

## 2024-04-09 DIAGNOSIS — I10 Essential (primary) hypertension: Secondary | ICD-10-CM

## 2024-06-06 ENCOUNTER — Encounter (HOSPITAL_BASED_OUTPATIENT_CLINIC_OR_DEPARTMENT_OTHER): Payer: Self-pay

## 2024-07-17 ENCOUNTER — Ambulatory Visit

## 2024-08-02 ENCOUNTER — Telehealth: Payer: Self-pay | Admitting: *Deleted

## 2024-08-02 NOTE — Telephone Encounter (Signed)
 Mammogram appointment january20.2026 @ 12:00 p to arrive 11:45 am at the breast center / information regarding the 75.00 no show fee attached to the appointment information.

## 2024-08-04 NOTE — Progress Notes (Signed)
 Makenzee Choudhry                                          MRN: [id number]   08/04/2024   The VBCI Quality Team Specialist reviewed this patient medical record for the purposes of chart review for care gap closure. The following were reviewed: chart review for care gap closure-controlling blood pressure.    VBCI Quality Team

## 2024-08-15 ENCOUNTER — Ambulatory Visit

## 2024-08-22 ENCOUNTER — Other Ambulatory Visit: Payer: Self-pay

## 2024-08-22 DIAGNOSIS — I10 Essential (primary) hypertension: Secondary | ICD-10-CM

## 2024-08-22 MED ORDER — OLMESARTAN MEDOXOMIL 40 MG PO TABS: 40.0000 mg | ORAL_TABLET | Freq: Every day | ORAL | 0 refills | Status: AC

## 2024-08-22 NOTE — Telephone Encounter (Signed)
 Received Refill request from pharmacy for Benicar  40mg  take one tab daily Pt's last visit on 02/29/2024- BP  200/95 MD made referral to Cardiology, but [facility] closed the referral due to 3 unsuccessful attempts at contacting the patient.  CMA attempted to contact pt with an appt No answer, message left on recorder.  Will send in one mth supply and make pcp aware

## 2024-08-22 NOTE — Telephone Encounter (Signed)
 I called [facility] about Olmesartan  rx; stated they need Dr Chuck license # and expiration date(I do not have). I will request to [facility] Team to re-send.

## 2024-08-22 NOTE — Telephone Encounter (Signed)
 Copied from CRM #[id number]. Topic: Clinical - Prescription Issue >> Aug 22, 2024 12:26 PM Miquel SAILOR wrote: Reason for CRM: olmesartan  (BENICAR ) 40 MG tablet-Pharmacy calling due to person PCP is not licensed in [place]. Needs call back on clarification for medication to be refilled. [phone]

## 2024-08-31 ENCOUNTER — Telehealth: Payer: Self-pay | Admitting: *Deleted

## 2024-08-31 NOTE — Telephone Encounter (Signed)
 Mammogram appointment [phone] canceled  /  previous appointment canceled 07-17-2024 @ 12:00 canceled and rescheduled for 07-17-2024 @ 2 pm canceled.

## 2024-09-01 NOTE — Telephone Encounter (Signed)
 Patient called back stating she received a call about making an appt. Informed patient that the call was letting her know that she needs to come in and be seen by her provider because she hasn't been seen since August & was suppose to return in November. Also informed patient that she needs to schedule her mammogram appt as well. Patient states that she know she needs to schedule her mammogram appt & she will but states right now she's booked up with appts. Patient states that she wasn't aware of what the wellness visit  was for & thought she was seeing her provider. She then gets upset & states that she may have to find another doctor because every time she comes to the clinic, she sees someone different and she's needing one doctor that can tell her what's wrong with her. Informed patient that this clinic is ran by residents & she will see other providers when her provider isn't available. Patient stated she will probably have to find someone else. I did inform patient that until she sees her provider, she will most likely not be able to get any medication refills until then. States she will call back hopefully before then to schedule an appt.

## 2024-09-04 ENCOUNTER — Institutional Professional Consult (permissible substitution) (HOSPITAL_BASED_OUTPATIENT_CLINIC_OR_DEPARTMENT_OTHER): Admitting: Family

## 2024-09-20 ENCOUNTER — Ambulatory Visit: Payer: Self-pay
